# Patient Record
Sex: Male | Born: 1940 | Race: Black or African American | Hispanic: No | Marital: Married | Smoking: Former smoker
Health system: Southern US, Community
[De-identification: ages and names within clinical notes are randomized; demographics above are authoritative.]

## PROBLEM LIST (undated history)

## (undated) DIAGNOSIS — J45909 Unspecified asthma, uncomplicated: Secondary | ICD-10-CM

## (undated) DIAGNOSIS — R413 Other amnesia: Secondary | ICD-10-CM

## (undated) DIAGNOSIS — N400 Enlarged prostate without lower urinary tract symptoms: Secondary | ICD-10-CM

## (undated) DIAGNOSIS — M1990 Unspecified osteoarthritis, unspecified site: Secondary | ICD-10-CM

## (undated) HISTORY — DX: Unspecified osteoarthritis, unspecified site: M19.90

## (undated) HISTORY — PX: TONSILLECTOMY: SUR1361

## (undated) HISTORY — DX: Unspecified asthma, uncomplicated: J45.909

## (undated) HISTORY — DX: Other amnesia: R41.3

## (undated) HISTORY — DX: Benign prostatic hyperplasia without lower urinary tract symptoms: N40.0

---

## 2001-02-24 ENCOUNTER — Encounter: Payer: Self-pay | Admitting: Emergency Medicine

## 2001-02-24 ENCOUNTER — Emergency Department (HOSPITAL_COMMUNITY): Admission: EM | Admit: 2001-02-24 | Discharge: 2001-02-24 | Payer: Self-pay | Admitting: Emergency Medicine

## 2002-01-03 ENCOUNTER — Emergency Department (HOSPITAL_COMMUNITY): Admission: EM | Admit: 2002-01-03 | Discharge: 2002-01-03 | Payer: Self-pay | Admitting: Emergency Medicine

## 2002-05-27 ENCOUNTER — Encounter: Payer: Self-pay | Admitting: Orthopedic Surgery

## 2002-05-27 ENCOUNTER — Encounter: Admission: RE | Admit: 2002-05-27 | Discharge: 2002-05-27 | Payer: Self-pay | Admitting: Orthopedic Surgery

## 2002-06-10 ENCOUNTER — Encounter: Admission: RE | Admit: 2002-06-10 | Discharge: 2002-06-10 | Payer: Self-pay | Admitting: Orthopedic Surgery

## 2002-06-10 ENCOUNTER — Encounter: Payer: Self-pay | Admitting: Orthopedic Surgery

## 2002-06-24 ENCOUNTER — Encounter: Payer: Self-pay | Admitting: Orthopedic Surgery

## 2002-06-24 ENCOUNTER — Encounter: Admission: RE | Admit: 2002-06-24 | Discharge: 2002-06-24 | Payer: Self-pay | Admitting: Orthopedic Surgery

## 2002-11-09 ENCOUNTER — Emergency Department (HOSPITAL_COMMUNITY): Admission: EM | Admit: 2002-11-09 | Discharge: 2002-11-09 | Payer: Self-pay | Admitting: Emergency Medicine

## 2005-01-02 ENCOUNTER — Ambulatory Visit: Payer: Self-pay | Admitting: Gastroenterology

## 2005-01-08 ENCOUNTER — Ambulatory Visit (HOSPITAL_COMMUNITY): Admission: RE | Admit: 2005-01-08 | Discharge: 2005-01-08 | Payer: Self-pay | Admitting: Gastroenterology

## 2007-09-30 ENCOUNTER — Emergency Department (HOSPITAL_COMMUNITY): Admission: EM | Admit: 2007-09-30 | Discharge: 2007-09-30 | Payer: Self-pay | Admitting: Emergency Medicine

## 2008-11-10 ENCOUNTER — Encounter: Admission: RE | Admit: 2008-11-10 | Discharge: 2008-11-10 | Payer: Self-pay | Admitting: Internal Medicine

## 2008-11-17 ENCOUNTER — Encounter: Admission: RE | Admit: 2008-11-17 | Discharge: 2008-11-17 | Payer: Self-pay | Admitting: Internal Medicine

## 2010-12-16 ENCOUNTER — Inpatient Hospital Stay (INDEPENDENT_AMBULATORY_CARE_PROVIDER_SITE_OTHER)
Admission: RE | Admit: 2010-12-16 | Discharge: 2010-12-16 | Disposition: A | Discharge status: Home / Self Care | Payer: Medicare Other | Source: Ambulatory Visit | Attending: Family Medicine | Admitting: Family Medicine

## 2010-12-16 DIAGNOSIS — H6120 Impacted cerumen, unspecified ear: Secondary | ICD-10-CM

## 2010-12-16 DIAGNOSIS — R002 Palpitations: Secondary | ICD-10-CM

## 2010-12-16 DIAGNOSIS — K219 Gastro-esophageal reflux disease without esophagitis: Secondary | ICD-10-CM

## 2010-12-16 DIAGNOSIS — J309 Allergic rhinitis, unspecified: Secondary | ICD-10-CM

## 2011-11-09 DIAGNOSIS — N400 Enlarged prostate without lower urinary tract symptoms: Secondary | ICD-10-CM | POA: Diagnosis not present

## 2011-11-09 DIAGNOSIS — J45909 Unspecified asthma, uncomplicated: Secondary | ICD-10-CM | POA: Diagnosis not present

## 2011-11-09 DIAGNOSIS — R636 Underweight: Secondary | ICD-10-CM | POA: Diagnosis not present

## 2011-11-09 DIAGNOSIS — F411 Generalized anxiety disorder: Secondary | ICD-10-CM | POA: Diagnosis not present

## 2012-01-21 DIAGNOSIS — H2510 Age-related nuclear cataract, unspecified eye: Secondary | ICD-10-CM | POA: Diagnosis not present

## 2012-03-27 DIAGNOSIS — Z125 Encounter for screening for malignant neoplasm of prostate: Secondary | ICD-10-CM | POA: Diagnosis not present

## 2012-03-27 DIAGNOSIS — R350 Frequency of micturition: Secondary | ICD-10-CM | POA: Diagnosis not present

## 2012-03-27 DIAGNOSIS — R351 Nocturia: Secondary | ICD-10-CM | POA: Diagnosis not present

## 2012-04-10 DIAGNOSIS — F329 Major depressive disorder, single episode, unspecified: Secondary | ICD-10-CM | POA: Diagnosis not present

## 2012-04-10 DIAGNOSIS — N400 Enlarged prostate without lower urinary tract symptoms: Secondary | ICD-10-CM | POA: Diagnosis not present

## 2012-04-10 DIAGNOSIS — R413 Other amnesia: Secondary | ICD-10-CM | POA: Diagnosis not present

## 2012-07-14 DIAGNOSIS — H6120 Impacted cerumen, unspecified ear: Secondary | ICD-10-CM | POA: Diagnosis not present

## 2012-07-14 DIAGNOSIS — J45909 Unspecified asthma, uncomplicated: Secondary | ICD-10-CM | POA: Diagnosis not present

## 2012-07-14 DIAGNOSIS — N400 Enlarged prostate without lower urinary tract symptoms: Secondary | ICD-10-CM | POA: Diagnosis not present

## 2012-08-19 DIAGNOSIS — Z23 Encounter for immunization: Secondary | ICD-10-CM | POA: Diagnosis not present

## 2012-09-22 DIAGNOSIS — Z125 Encounter for screening for malignant neoplasm of prostate: Secondary | ICD-10-CM | POA: Diagnosis not present

## 2012-09-22 DIAGNOSIS — R351 Nocturia: Secondary | ICD-10-CM | POA: Diagnosis not present

## 2012-09-22 DIAGNOSIS — N400 Enlarged prostate without lower urinary tract symptoms: Secondary | ICD-10-CM | POA: Diagnosis not present

## 2012-09-29 DIAGNOSIS — N400 Enlarged prostate without lower urinary tract symptoms: Secondary | ICD-10-CM | POA: Diagnosis not present

## 2012-10-16 DIAGNOSIS — K219 Gastro-esophageal reflux disease without esophagitis: Secondary | ICD-10-CM | POA: Diagnosis not present

## 2012-10-16 DIAGNOSIS — K5289 Other specified noninfective gastroenteritis and colitis: Secondary | ICD-10-CM | POA: Diagnosis not present

## 2013-01-21 DIAGNOSIS — J45909 Unspecified asthma, uncomplicated: Secondary | ICD-10-CM | POA: Diagnosis not present

## 2013-01-21 DIAGNOSIS — F329 Major depressive disorder, single episode, unspecified: Secondary | ICD-10-CM | POA: Diagnosis not present

## 2013-01-21 DIAGNOSIS — R413 Other amnesia: Secondary | ICD-10-CM | POA: Diagnosis not present

## 2013-01-21 DIAGNOSIS — E781 Pure hyperglyceridemia: Secondary | ICD-10-CM | POA: Diagnosis not present

## 2013-01-30 ENCOUNTER — Emergency Department (INDEPENDENT_AMBULATORY_CARE_PROVIDER_SITE_OTHER)
Admission: EM | Admit: 2013-01-30 | Discharge: 2013-01-30 | Disposition: A | Discharge status: Home / Self Care | Payer: Medicare Other | Source: Home / Self Care | Attending: Emergency Medicine | Admitting: Emergency Medicine

## 2013-01-30 ENCOUNTER — Emergency Department (INDEPENDENT_AMBULATORY_CARE_PROVIDER_SITE_OTHER): Payer: Medicare Other

## 2013-01-30 ENCOUNTER — Encounter (HOSPITAL_COMMUNITY): Payer: Self-pay | Admitting: Emergency Medicine

## 2013-01-30 DIAGNOSIS — F0390 Unspecified dementia without behavioral disturbance: Secondary | ICD-10-CM | POA: Diagnosis not present

## 2013-01-30 DIAGNOSIS — R05 Cough: Secondary | ICD-10-CM | POA: Diagnosis not present

## 2013-01-30 DIAGNOSIS — J438 Other emphysema: Secondary | ICD-10-CM

## 2013-01-30 DIAGNOSIS — J019 Acute sinusitis, unspecified: Secondary | ICD-10-CM

## 2013-01-30 DIAGNOSIS — K219 Gastro-esophageal reflux disease without esophagitis: Secondary | ICD-10-CM

## 2013-01-30 DIAGNOSIS — J439 Emphysema, unspecified: Secondary | ICD-10-CM

## 2013-01-30 LAB — POCT I-STAT, CHEM 8
BUN: 12 mg/dL (ref 6–23)
Calcium, Ion: 1.12 mmol/L — ABNORMAL LOW (ref 1.13–1.30)
Creatinine, Ser: 0.9 mg/dL (ref 0.50–1.35)
Glucose, Bld: 100 mg/dL — ABNORMAL HIGH (ref 70–99)
Hemoglobin: 14.6 g/dL (ref 13.0–17.0)
TCO2: 28 mmol/L (ref 0–100)

## 2013-01-30 MED ORDER — DONEPEZIL HCL 10 MG PO TABS: 5.0000 mg | ORAL_TABLET | Freq: Every evening | ORAL | Status: DC | PRN

## 2013-01-30 MED ORDER — FLUTICASONE PROPIONATE 50 MCG/ACT NA SUSP: 2.0000 | Freq: Every day | NASAL | Status: AC

## 2013-01-30 MED ORDER — ESOMEPRAZOLE MAGNESIUM 40 MG PO CPDR: 40.0000 mg | DELAYED_RELEASE_CAPSULE | Freq: Every day | ORAL | Status: DC

## 2013-01-30 MED ORDER — CEPHALEXIN 500 MG PO CAPS: 500.0000 mg | ORAL_CAPSULE | Freq: Three times a day (TID) | ORAL | Status: DC

## 2013-01-30 NOTE — ED Notes (Signed)
Pt c/o acid indigestion and emesis for x 1 week. Pt admits that he did have [employer] french fries and a yogurt parfait at the onset of symptoms. Also admits to midnight snacking. Feels nauseous and indigestion keeps him up at night. Has taken Omeprazole up until Thursday with no relief. Last reported emesis this morning. Patient is alert and oriented.

## 2013-01-30 NOTE — ED Provider Notes (Signed)
Chief Complaint:   Chief Complaint  Patient presents with  . Emesis  . Gastrophageal Reflux    History of Present Illness:   Andre Ball is a 72 year old male with Alzheimer's disease who presents with nasal congestion and reflux symptoms. He is very forgetful and a poor historian. Fortunately his wife accompanied him and was able to fill in most of the history.  1. Nasal congestion: He has had a two-week history of nasal congestion with green drainage, slight cough, sore throat, postnasal drip, and headache that comes and goes. He denies any fever, difficulty breathing, or wheezing.  2. Indigestion and heartburn: He has a long-standing history of indigestion heartburn. Recently his Aricept was increased to 10 mg from 5 mg. This seemed to make his indigestion worse. He notes waterbrash, chest pain, nausea, vomiting, epigastric discomfort, and not sleeping well at night. He denies any dysphagia, evidence of GI bleeding, diarrhea, or difficulty breathing.  Review of Systems:  Other than noted above, the patient denies any of the following symptoms. Systemic:  No fever, chills, sweats, fatigue, myalgias, headache, or anorexia. Eye:  No redness, pain or drainage. ENT:  No earache, nasal congestion, rhinorrhea, sinus pressure, or sore throat. Lungs:  No cough, sputum production, wheezing, shortness of breath.  Cardiovascular:  No chest pain, palpitations, or syncope. GI:  No nausea, vomiting, abdominal pain or diarrhea. GU:  No dysuria, frequency, or hematuria. Skin:  No rash or pruritis.  PMFSH:  Past medical history, family history, social history, meds, and allergies were reviewed.  He has no medication allergies. He takes Aricept, Singulair, Zoloft, Flomax, and Flonase. He has BPH, and dementia. He lives with his wife.  Physical Exam:   Vital signs:  There were no vitals taken for this visit. General:  Alert, in no distress. Eye:  PERRL, full EOMs.  Lids and conjunctivas were  normal. ENT:  TMs and canals were normal, without erythema or inflammation.  Nasal mucosa was clear and uncongested, without drainage.  Mucous membranes were moist.  Pharynx was clear, without exudate or drainage.  There were no oral ulcerations or lesions. Neck:  Supple, no adenopathy, tenderness or mass. Thyroid was normal. Lungs:  No respiratory distress.  Lungs were clear to auscultation, without wheezes, rales or rhonchi.  Breath sounds were clear and equal bilaterally. Heart:  Regular rhythm, without gallops, murmers or rubs. Abdomen:  Soft, flat, and non-tender to palpation.  No hepatosplenomagaly or mass. Skin:  Clear, warm, and dry, without rash or lesions.  Labs:   Results for orders placed during the hospital encounter of 01/30/13  POCT I-STAT, CHEM 8      Result Value Range   Sodium 141  135 - 145 mEq/L   Potassium 4.3  3.5 - 5.1 mEq/L   Chloride 106  96 - 112 mEq/L   BUN 12  6 - 23 mg/dL   Creatinine, Ser 2.13  0.50 - 1.35 mg/dL   Glucose, Bld 086 (*) 70 - 99 mg/dL   Calcium, Ion 5.78 (*) 1.13 - 1.30 mmol/L   TCO2 28  0 - 100 mmol/L   Hemoglobin 14.6  13.0 - 17.0 g/dL   HCT 46.9  62.9 - 52.8 %     Radiology:  Dg Chest 2 View  01/30/2013  *RADIOLOGY REPORT*  Clinical Data: Cough, abdominal pain  CHEST - 2 VIEW  Comparison: 11/10/2008  Findings: Hyperinflation noted compatible with COPD/emphysema. Normal heart size and vascularity.  No focal pneumonia, collapse, consolidation, effusion or pneumothorax.  Trachea midline.  No significant interval change.  IMPRESSION: Hyperinflation.  No acute process.   Original Report Authenticated By: Judie Petit. Miles Costain, M.D.     Date: 01/30/2013  Rate: 53  Rhythm: sinus bradycardia with premature atrial complexes.  QRS Axis: normal  Intervals: normal  ST/T Wave abnormalities: normal  Conduction Disutrbances:none  Narrative Interpretation: Sinus bradycardia with premature atrial complexes, incomplete right bundle branch block, otherwise normal.   Old EKG Reviewed: none available  Assessment:  The primary encounter diagnosis was Acute sinusitis. Diagnoses of GERD (gastroesophageal reflux disease), Dementia, and Emphysema were also pertinent to this visit.  He appears to have acute sinusitis, possibly on the basis of allergic rhinitis. It also appears he has a secondary problem with reflux esophagitis and other GI symptoms, possibly due to increase in his Aricept recently. He was told to decrease his Aricept back down to 5 mg a day, he was given Nexium for his reflux and told to followup in a week with his primary care physician.  Plan:   1.  The following meds were prescribed:   Discharge Medication List as of 01/30/2013  5:17 PM    START taking these medications   Details  cephALEXin (KEFLEX) 500 MG capsule Take 1 capsule (500 mg total) by mouth 3 (three) times daily., Starting 01/30/2013, Until Discontinued, Normal    esomeprazole (NEXIUM) 40 MG capsule Take 1 capsule (40 mg total) by mouth daily., Starting 01/30/2013, Until Discontinued, Normal    fluticasone (FLONASE) 50 MCG/ACT nasal spray Place 2 sprays into the nose daily., Starting 01/30/2013, Until Discontinued, Normal       2.  The patient was instructed in symptomatic care and handouts were given. 3.  The patient was told to return if becoming worse in any way, if no better in 3 or 4 days, and given some red flag symptoms such as fever, difficulty breathing, or worsening pain that would indicate earlier return.    Reuben Likes, MD 01/30/13 2014

## 2013-02-25 DIAGNOSIS — F980 Enuresis not due to a substance or known physiological condition: Secondary | ICD-10-CM | POA: Diagnosis not present

## 2013-03-02 DIAGNOSIS — H04129 Dry eye syndrome of unspecified lacrimal gland: Secondary | ICD-10-CM | POA: Diagnosis not present

## 2013-03-17 DIAGNOSIS — R350 Frequency of micturition: Secondary | ICD-10-CM | POA: Diagnosis not present

## 2013-03-17 DIAGNOSIS — R351 Nocturia: Secondary | ICD-10-CM | POA: Diagnosis not present

## 2013-03-18 DIAGNOSIS — H43819 Vitreous degeneration, unspecified eye: Secondary | ICD-10-CM | POA: Diagnosis not present

## 2013-04-07 DIAGNOSIS — M25569 Pain in unspecified knee: Secondary | ICD-10-CM | POA: Diagnosis not present

## 2013-04-14 DIAGNOSIS — M25569 Pain in unspecified knee: Secondary | ICD-10-CM | POA: Diagnosis not present

## 2013-04-15 ENCOUNTER — Other Ambulatory Visit: Payer: Self-pay | Admitting: Family Medicine

## 2013-04-15 ENCOUNTER — Ambulatory Visit
Admission: RE | Admit: 2013-04-15 | Discharge: 2013-04-15 | Disposition: A | Discharge status: Home / Self Care | Payer: Medicare Other | Source: Ambulatory Visit | Attending: Family Medicine | Admitting: Family Medicine

## 2013-04-15 DIAGNOSIS — M25561 Pain in right knee: Secondary | ICD-10-CM

## 2013-04-15 DIAGNOSIS — M25569 Pain in unspecified knee: Secondary | ICD-10-CM | POA: Diagnosis not present

## 2013-04-15 DIAGNOSIS — S99929A Unspecified injury of unspecified foot, initial encounter: Secondary | ICD-10-CM | POA: Diagnosis not present

## 2013-04-15 DIAGNOSIS — S8990XA Unspecified injury of unspecified lower leg, initial encounter: Secondary | ICD-10-CM | POA: Diagnosis not present

## 2013-04-30 DIAGNOSIS — M25569 Pain in unspecified knee: Secondary | ICD-10-CM | POA: Diagnosis not present

## 2013-04-30 DIAGNOSIS — R002 Palpitations: Secondary | ICD-10-CM | POA: Diagnosis not present

## 2013-05-06 DIAGNOSIS — R002 Palpitations: Secondary | ICD-10-CM | POA: Diagnosis not present

## 2013-05-27 DIAGNOSIS — G309 Alzheimer's disease, unspecified: Secondary | ICD-10-CM | POA: Diagnosis not present

## 2013-05-27 DIAGNOSIS — F0280 Dementia in other diseases classified elsewhere without behavioral disturbance: Secondary | ICD-10-CM | POA: Diagnosis not present

## 2013-05-27 DIAGNOSIS — I499 Cardiac arrhythmia, unspecified: Secondary | ICD-10-CM | POA: Diagnosis not present

## 2013-06-18 DIAGNOSIS — I059 Rheumatic mitral valve disease, unspecified: Secondary | ICD-10-CM | POA: Diagnosis not present

## 2013-06-18 DIAGNOSIS — J45909 Unspecified asthma, uncomplicated: Secondary | ICD-10-CM | POA: Diagnosis not present

## 2013-06-18 DIAGNOSIS — I499 Cardiac arrhythmia, unspecified: Secondary | ICD-10-CM | POA: Diagnosis not present

## 2013-06-18 DIAGNOSIS — R413 Other amnesia: Secondary | ICD-10-CM | POA: Diagnosis not present

## 2013-07-10 DIAGNOSIS — L819 Disorder of pigmentation, unspecified: Secondary | ICD-10-CM | POA: Diagnosis not present

## 2013-07-21 DIAGNOSIS — R351 Nocturia: Secondary | ICD-10-CM | POA: Diagnosis not present

## 2013-07-21 DIAGNOSIS — N400 Enlarged prostate without lower urinary tract symptoms: Secondary | ICD-10-CM | POA: Diagnosis not present

## 2013-07-21 DIAGNOSIS — R350 Frequency of micturition: Secondary | ICD-10-CM | POA: Diagnosis not present

## 2013-07-31 DIAGNOSIS — R413 Other amnesia: Secondary | ICD-10-CM | POA: Diagnosis not present

## 2013-07-31 DIAGNOSIS — N400 Enlarged prostate without lower urinary tract symptoms: Secondary | ICD-10-CM | POA: Diagnosis not present

## 2013-08-27 DIAGNOSIS — Z23 Encounter for immunization: Secondary | ICD-10-CM | POA: Diagnosis not present

## 2013-09-16 DIAGNOSIS — N529 Male erectile dysfunction, unspecified: Secondary | ICD-10-CM | POA: Diagnosis not present

## 2013-09-16 DIAGNOSIS — R413 Other amnesia: Secondary | ICD-10-CM | POA: Diagnosis not present

## 2013-09-16 DIAGNOSIS — N400 Enlarged prostate without lower urinary tract symptoms: Secondary | ICD-10-CM | POA: Diagnosis not present

## 2013-09-22 DIAGNOSIS — R351 Nocturia: Secondary | ICD-10-CM | POA: Diagnosis not present

## 2013-09-22 DIAGNOSIS — Z125 Encounter for screening for malignant neoplasm of prostate: Secondary | ICD-10-CM | POA: Diagnosis not present

## 2013-09-22 DIAGNOSIS — R350 Frequency of micturition: Secondary | ICD-10-CM | POA: Diagnosis not present

## 2013-09-29 DIAGNOSIS — Z125 Encounter for screening for malignant neoplasm of prostate: Secondary | ICD-10-CM | POA: Diagnosis not present

## 2013-09-29 DIAGNOSIS — R350 Frequency of micturition: Secondary | ICD-10-CM | POA: Diagnosis not present

## 2013-09-29 DIAGNOSIS — N400 Enlarged prostate without lower urinary tract symptoms: Secondary | ICD-10-CM | POA: Diagnosis not present

## 2013-09-29 DIAGNOSIS — R351 Nocturia: Secondary | ICD-10-CM | POA: Diagnosis not present

## 2013-11-30 DIAGNOSIS — F0280 Dementia in other diseases classified elsewhere without behavioral disturbance: Secondary | ICD-10-CM | POA: Diagnosis not present

## 2013-11-30 DIAGNOSIS — I499 Cardiac arrhythmia, unspecified: Secondary | ICD-10-CM | POA: Diagnosis not present

## 2013-12-11 DIAGNOSIS — H2510 Age-related nuclear cataract, unspecified eye: Secondary | ICD-10-CM | POA: Diagnosis not present

## 2013-12-11 DIAGNOSIS — H43819 Vitreous degeneration, unspecified eye: Secondary | ICD-10-CM | POA: Diagnosis not present

## 2013-12-17 DIAGNOSIS — J45909 Unspecified asthma, uncomplicated: Secondary | ICD-10-CM | POA: Diagnosis not present

## 2013-12-17 DIAGNOSIS — E781 Pure hyperglyceridemia: Secondary | ICD-10-CM | POA: Diagnosis not present

## 2013-12-17 DIAGNOSIS — R413 Other amnesia: Secondary | ICD-10-CM | POA: Diagnosis not present

## 2013-12-17 DIAGNOSIS — N400 Enlarged prostate without lower urinary tract symptoms: Secondary | ICD-10-CM | POA: Diagnosis not present

## 2014-01-04 DIAGNOSIS — L819 Disorder of pigmentation, unspecified: Secondary | ICD-10-CM | POA: Diagnosis not present

## 2014-03-24 DIAGNOSIS — F3289 Other specified depressive episodes: Secondary | ICD-10-CM | POA: Diagnosis not present

## 2014-03-24 DIAGNOSIS — E781 Pure hyperglyceridemia: Secondary | ICD-10-CM | POA: Diagnosis not present

## 2014-03-24 DIAGNOSIS — R413 Other amnesia: Secondary | ICD-10-CM | POA: Diagnosis not present

## 2014-03-24 DIAGNOSIS — F329 Major depressive disorder, single episode, unspecified: Secondary | ICD-10-CM | POA: Diagnosis not present

## 2014-06-05 ENCOUNTER — Encounter: Payer: Self-pay | Admitting: *Deleted

## 2014-06-23 DIAGNOSIS — N400 Enlarged prostate without lower urinary tract symptoms: Secondary | ICD-10-CM | POA: Diagnosis not present

## 2014-06-23 DIAGNOSIS — E781 Pure hyperglyceridemia: Secondary | ICD-10-CM | POA: Diagnosis not present

## 2014-06-23 DIAGNOSIS — R413 Other amnesia: Secondary | ICD-10-CM | POA: Diagnosis not present

## 2014-07-13 DIAGNOSIS — Z23 Encounter for immunization: Secondary | ICD-10-CM | POA: Diagnosis not present

## 2014-07-21 DIAGNOSIS — I499 Cardiac arrhythmia, unspecified: Secondary | ICD-10-CM | POA: Diagnosis not present

## 2014-09-01 DIAGNOSIS — R413 Other amnesia: Secondary | ICD-10-CM | POA: Diagnosis not present

## 2014-09-01 DIAGNOSIS — N400 Enlarged prostate without lower urinary tract symptoms: Secondary | ICD-10-CM | POA: Diagnosis not present

## 2014-09-29 DIAGNOSIS — Z0000 Encounter for general adult medical examination without abnormal findings: Secondary | ICD-10-CM | POA: Diagnosis not present

## 2014-09-30 DIAGNOSIS — F0390 Unspecified dementia without behavioral disturbance: Secondary | ICD-10-CM | POA: Diagnosis not present

## 2014-09-30 DIAGNOSIS — R350 Frequency of micturition: Secondary | ICD-10-CM | POA: Diagnosis not present

## 2014-09-30 DIAGNOSIS — N401 Enlarged prostate with lower urinary tract symptoms: Secondary | ICD-10-CM | POA: Diagnosis not present

## 2014-11-01 ENCOUNTER — Ambulatory Visit (INDEPENDENT_AMBULATORY_CARE_PROVIDER_SITE_OTHER): Payer: Medicare Other | Admitting: Neurology

## 2014-11-01 ENCOUNTER — Encounter: Payer: Self-pay | Admitting: Neurology

## 2014-11-01 VITALS — BP 137/71 | HR 60 | Ht 71.0 in | Wt 176.2 lb

## 2014-11-01 DIAGNOSIS — R413 Other amnesia: Secondary | ICD-10-CM | POA: Insufficient documentation

## 2014-11-01 DIAGNOSIS — R5381 Other malaise: Secondary | ICD-10-CM

## 2014-11-01 DIAGNOSIS — E538 Deficiency of other specified B group vitamins: Secondary | ICD-10-CM

## 2014-11-01 DIAGNOSIS — R5383 Other fatigue: Secondary | ICD-10-CM | POA: Diagnosis not present

## 2014-11-01 HISTORY — DX: Other amnesia: R41.3

## 2014-11-01 MED ORDER — DONEPEZIL HCL 10 MG PO TABS: 10.0000 mg | ORAL_TABLET | Freq: Every day | ORAL | Status: DC

## 2014-11-01 NOTE — Progress Notes (Signed)
Reason for visit: Memory disturbance  Andre Ball is a 74 y.o. male  History of present illness:  Andre Ball is a 74 year old right-handed black male with a history of a progressive memory disturbance that has been present for least 2 years. The patient has been placed on Aricept, currently on 5 mg at night. The patient reports that he does have some problems with short-term memory, difficulty remembering names for people and places. He has some difficulty misplacing things about the house, and he has some mild problems with directions while driving. He does do the finances, and he may have some occasional problems getting the bills paid on time. He denies any significant issues keeping up with medications or appointments. The patient has some slight problems with fatigue, but his main issue with sleeping at night is his urinary frequency. He denies any focal numbness or weakness on the face, arms, or legs. He denies any balance issues. He has a very strong family history of memory problems in both parents, and in at least 3 or 4 other siblings. He comes this office for an evaluation    Past Medical History  Diagnosis Date  . Asthma   . BPH (benign prostatic hyperplasia)   . Memory deficits 11/01/2014    Past Surgical History  Procedure Laterality Date  . Tonsillectomy      Family History  Problem Relation Age of Onset  . Alzheimer's disease Mother   . Alzheimer's disease Father   . Diabetes Brother   . Alzheimer's disease Sister   . Heart disease Brother   . Diabetes Brother   . Alzheimer's disease Sister   . Alzheimer's disease Brother   . Alzheimer's disease Brother     Social history:  reports that he has quit smoking. He has never used smokeless tobacco. He reports that he drinks about 0.6 oz of alcohol per week. He reports that he does not use illicit drugs.  Medications:  Current Outpatient Prescriptions on File Prior to Visit  Medication Sig Dispense Refill  .  fluticasone (FLONASE) 50 MCG/ACT nasal spray Place 2 sprays into the nose daily. 16 g 0  . montelukast (SINGULAIR) 10 MG tablet Take 10 mg by mouth at bedtime.    . sertraline (ZOLOFT) 25 MG tablet Take 25 mg by mouth daily.    . tamsulosin (FLOMAX) 0.4 MG CAPS Take 0.4 mg by mouth daily.    . [DISCONTINUED] omeprazole (PRILOSEC) 20 MG capsule Take 20 mg by mouth daily.     No current facility-administered medications on file prior to visit.     No Known Allergies  ROS:  Out of a complete 14 system review of symptoms, the patient complains only of the following symptoms, and all other reviewed systems are negative.  Weight gain, palpitations of the heart Skin rash, itching, moles Shortness of breath, incontinence Urination problems, impotence Joint pain Allergies, runny nose, skin sensitivity Memory loss, confusion, tremor Anxiety, not enough sleep, change in appetite Sleepiness, snoring  Blood pressure 137/71, pulse 60, height  (1.803 m), weight 176 lb 3.2 oz (79.924 kg).  Physical Exam  General: The patient is alert and cooperative at the time of the examination.  Eyes: Pupils are equal, round, and reactive to light. Discs are flat bilaterally.  Neck: The neck is supple, no carotid bruits are noted.  Respiratory: The respiratory examination is clear.  Cardiovascular: The cardiovascular examination reveals a regular rate and rhythm, no obvious murmurs or rubs are  noted.  Skin: Extremities are without significant edema.  Neurologic Exam  Mental status: The Mini-Mental Status Examination done today shows a total score 25/30.  Cranial nerves: Facial symmetry is present. There is good sensation of the face to pinprick and soft touch bilaterally. The strength of the facial muscles and the muscles to head turning and shoulder shrug are normal bilaterally. Speech is well enunciated, no aphasia or dysarthria is noted. Extraocular movements are full. Visual fields are full.  The tongue is midline, and the patient has symmetric elevation of the soft palate. No obvious hearing deficits are noted.  Motor: The motor testing reveals 5 over 5 strength of all 4 extremities. Good symmetric motor tone is noted throughout.  Sensory: Sensory testing is intact to pinprick, soft touch, vibration sensation, and position sense on all 4 extremities. No evidence of extinction is noted.  Coordination: Cerebellar testing reveals good finger-nose-finger and heel-to-shin bilaterally.  Gait and station: Gait is normal. Tandem gait is normal. Romberg is negative. No drift is seen.  Reflexes: Deep tendon reflexes are symmetric and normal bilaterally. Toes are downgoing bilaterally.   Assessment/Plan:  1. Progressive memory disturbance  The patient has a very strong family history of Alzheimer's disease. He is on low-dose Aricept, the dose will be increased. In the future, we may add Namenda to his dosing. He is interested in learning more about Alzheimer's research. I will follow-up with him in about 6 months. Blood work will be done today, he will be set up for MRI of the brain.  Marlan Palau MD 11/01/2014 8:02 PM  [facility] [address] [place], [place] [address]  Phone [phone] Fax [phone]

## 2014-11-01 NOTE — Patient Instructions (Signed)
Begin Aricept (donepezil) at 10 mg at night. Look out for side effects that may include nausea, diarrhea, weight loss, or stomach cramps. This medication will also cause a runny nose, therefore there is no need for allergy medications for this purpose.

## 2014-11-02 LAB — SPECIMEN STATUS REPORT

## 2014-11-04 LAB — VITAMIN B12: VITAMIN B 12: 700 pg/mL (ref 211–946)

## 2014-11-04 LAB — RPR: SYPHILIS RPR SCR: NONREACTIVE

## 2014-11-04 LAB — COPPER, SERUM: Copper: 133 ug/dL (ref 72–166)

## 2014-11-04 LAB — TSH: TSH: 2.04 u[IU]/mL (ref 0.450–4.500)

## 2014-12-01 DIAGNOSIS — R413 Other amnesia: Secondary | ICD-10-CM | POA: Diagnosis not present

## 2014-12-01 DIAGNOSIS — N529 Male erectile dysfunction, unspecified: Secondary | ICD-10-CM | POA: Diagnosis not present

## 2014-12-01 DIAGNOSIS — J45909 Unspecified asthma, uncomplicated: Secondary | ICD-10-CM | POA: Diagnosis not present

## 2014-12-02 ENCOUNTER — Ambulatory Visit
Admission: RE | Admit: 2014-12-02 | Discharge: 2014-12-02 | Disposition: A | Discharge status: Home / Self Care | Payer: Medicare Other | Source: Ambulatory Visit | Attending: Neurology | Admitting: Neurology

## 2014-12-02 DIAGNOSIS — E785 Hyperlipidemia, unspecified: Secondary | ICD-10-CM | POA: Diagnosis not present

## 2014-12-02 DIAGNOSIS — R413 Other amnesia: Secondary | ICD-10-CM

## 2014-12-02 DIAGNOSIS — J45998 Other asthma: Secondary | ICD-10-CM | POA: Diagnosis not present

## 2014-12-03 ENCOUNTER — Telehealth: Payer: Self-pay | Admitting: Neurology

## 2014-12-03 NOTE — Telephone Encounter (Signed)
   I called the patient. I discussed the results of the MRI with him and his wife. They are interested in Alzheimer's research, I will forward a note to our research coordinator.   MRI brain 12/03/2014:  IMPRESSION:  Abnormal MRI brain (without) demonstrating: 1. Severe right and moderate-severe left mesial temporal atrophy.  2. 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 Autoimmune, inflammatory or post-infectious etiologies less likely.  3. 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 4. No acute findings.

## 2014-12-08 DIAGNOSIS — T7840XS Allergy, unspecified, sequela: Secondary | ICD-10-CM | POA: Diagnosis not present

## 2014-12-17 DIAGNOSIS — H43813 Vitreous degeneration, bilateral: Secondary | ICD-10-CM | POA: Diagnosis not present

## 2014-12-17 DIAGNOSIS — H2513 Age-related nuclear cataract, bilateral: Secondary | ICD-10-CM | POA: Diagnosis not present

## 2014-12-18 ENCOUNTER — Emergency Department (INDEPENDENT_AMBULATORY_CARE_PROVIDER_SITE_OTHER)
Admission: EM | Admit: 2014-12-18 | Discharge: 2014-12-18 | Disposition: A | Discharge status: Home / Self Care | Payer: Medicare Other | Source: Home / Self Care | Attending: Emergency Medicine | Admitting: Emergency Medicine

## 2014-12-18 ENCOUNTER — Encounter (HOSPITAL_COMMUNITY): Payer: Self-pay | Admitting: Emergency Medicine

## 2014-12-18 DIAGNOSIS — G479 Sleep disorder, unspecified: Secondary | ICD-10-CM | POA: Diagnosis not present

## 2014-12-18 NOTE — ED Provider Notes (Signed)
CSN: [id number]     Arrival date & time 12/18/14  1341 History   First MD Initiated Contact with Patient 12/18/14 1435     Chief Complaint  Patient presents with  . Nasal Congestion  . Pleurisy   (Consider location/radiation/quality/duration/timing/severity/associated sxs/prior Treatment) HPI  Pt is an extremely poor historian.  Pt presents for eval of congestion, neck muscular tightness, strange feeling in feet, rhinorrhea, allergies, chest congestion, not sleeping well, waking up at night to urinate. All of these problems are being treated by his primary care doctor, he is here today because she does not his doctor is doing a good job and he wants to see a specialist. He is unable to say exactly what sort of specialist he might want to see and what problem he needs to see a specialist for. All of these issues have been going on for many months. He is currently asymptomatic. He has no recent change in any of his symptoms. No chest pain, shortness of breath, NVD, abdominal pain. No recent travel or sick contacts. Of note, he has dementia although he is very high functioning  Past Medical History  Diagnosis Date  . Asthma   . BPH (benign prostatic hyperplasia)   . Memory deficits 11/01/2014   Past Surgical History  Procedure Laterality Date  . Tonsillectomy     Family History  Problem Relation Age of Onset  . Alzheimer's disease Mother   . Alzheimer's disease Father   . Diabetes Brother   . Alzheimer's disease Sister   . Heart disease Brother   . Diabetes Brother   . Alzheimer's disease Sister   . Alzheimer's disease Brother   . Alzheimer's disease Brother    History  Substance Use Topics  . Smoking status: Former Games developermoker  . Smokeless tobacco: Never Used  . Alcohol Use: 0.6 oz/week    1 Glasses of wine per week     Comment: occasionally    Review of Systems  Constitutional: Positive for fatigue.  HENT: Positive for congestion.   Respiratory: Positive for chest tightness.    Cardiovascular: Negative for chest pain.  Gastrointestinal: Negative for nausea, vomiting and diarrhea.  Endocrine: Positive for polyuria.  Genitourinary: Positive for urgency.  Musculoskeletal: Positive for neck pain.  Skin: Negative for rash.  Neurological: Positive for tremors.  Psychiatric/Behavioral: Positive for confusion and sleep disturbance.  All other systems reviewed and are negative.   Allergies  Review of patient's allergies indicates no known allergies.  Home Medications   Prior to Admission medications   Medication Sig Start Date End Date Taking? Authorizing Provider  aspirin 81 MG tablet Take 81 mg by mouth daily.   Yes Historical Provider, MD  donepezil (ARICEPT) 10 MG tablet Take 1 tablet (10 mg total) by mouth at bedtime. 11/01/14  Yes York Spanielharles K Willis, MD  finasteride (PROSCAR) 5 MG tablet Take 5 mg by mouth daily. 09/30/14  Yes Historical Provider, MD  montelukast (SINGULAIR) 10 MG tablet Take 10 mg by mouth at bedtime.   Yes Historical Provider, MD  Multiple Vitamin (MULTIVITAMIN) tablet Take 1 tablet by mouth daily.   Yes Historical Provider, MD  sertraline (ZOLOFT) 25 MG tablet Take 25 mg by mouth daily.   Yes Historical Provider, MD  tamsulosin (FLOMAX) 0.4 MG CAPS Take 0.4 mg by mouth daily.   Yes Historical Provider, MD  fluticasone (FLONASE) 50 MCG/ACT nasal spray Place 2 sprays into the nose daily. 01/30/13   Reuben Likesavid C Keller, MD   BP 171/75 mmHg  Pulse 61  Temp(Src) 98.2 F (36.8 C) (Oral)  Resp 16  SpO2 100% Physical Exam  Constitutional: He is oriented to person, place, and time. He appears well-developed and well-nourished. No distress.  HENT:  Head: Normocephalic.  Mouth/Throat: Oropharynx is clear and moist.  Neck: Normal range of motion. Neck supple.  Cardiovascular: Normal rate, regular rhythm and normal heart sounds.   Pulmonary/Chest: Effort normal and breath sounds normal. No respiratory distress.  Abdominal: Soft. He exhibits no mass. There  is no tenderness. There is no rebound and no guarding.  Neurological: He is alert and oriented to person, place, and time. Coordination normal.  Skin: Skin is warm and dry. No rash noted. He is not diaphoretic.  Psychiatric: He has a normal mood and affect. Judgment normal.  Nursing note and vitals reviewed.   ED Course  Procedures (including critical care time) Labs Review Labs Reviewed - No data to display  Imaging Review No results found.   MDM   1. Sleep disturbance    Patient with wide variety of complaints. I discussed all of these with the patient in great length. He is unable to narrow down what he would like to focus on today. He will follow-up with his primary care doctor for treatment of all of these problems.     Graylon Good, PA-C 12/18/14 [address] PA-C 12/18/14 1524

## 2014-12-18 NOTE — ED Notes (Signed)
Pt states that he has had chest congestion with cough and chest tightness for 2 weeks. Pt is in no acute distress at this time.

## 2014-12-18 NOTE — Discharge Instructions (Signed)
You need to follow up with your primary care physician for all of these issues.  If you are unsatisfied with your primary care physician, you are always free to seek a new one.  Dr. Duanne Guessewey is an excellent family physician, you may call her office to try to make a follow up appointment.

## 2014-12-20 DIAGNOSIS — R0789 Other chest pain: Secondary | ICD-10-CM | POA: Diagnosis not present

## 2014-12-20 DIAGNOSIS — I499 Cardiac arrhythmia, unspecified: Secondary | ICD-10-CM | POA: Diagnosis not present

## 2014-12-21 DIAGNOSIS — R413 Other amnesia: Secondary | ICD-10-CM | POA: Diagnosis not present

## 2014-12-21 DIAGNOSIS — R0981 Nasal congestion: Secondary | ICD-10-CM | POA: Diagnosis not present

## 2014-12-23 DIAGNOSIS — I495 Sick sinus syndrome: Secondary | ICD-10-CM | POA: Diagnosis not present

## 2014-12-23 DIAGNOSIS — R002 Palpitations: Secondary | ICD-10-CM | POA: Diagnosis not present

## 2014-12-27 ENCOUNTER — Telehealth: Payer: Self-pay | Admitting: Neurology

## 2014-12-27 DIAGNOSIS — R0789 Other chest pain: Secondary | ICD-10-CM | POA: Diagnosis not present

## 2014-12-27 NOTE — Telephone Encounter (Signed)
Patient called and has questions concerning his prescriptions sent in to the pharmacy he can be reached at [phone].

## 2014-12-27 NOTE — Telephone Encounter (Signed)
Spoke to patient and relayed that the prescription was for Donepezil 10mg  1 daily.  Patient verbalized understanding.

## 2015-01-04 DIAGNOSIS — R0789 Other chest pain: Secondary | ICD-10-CM | POA: Diagnosis not present

## 2015-01-04 DIAGNOSIS — G309 Alzheimer's disease, unspecified: Secondary | ICD-10-CM | POA: Diagnosis not present

## 2015-01-04 DIAGNOSIS — I499 Cardiac arrhythmia, unspecified: Secondary | ICD-10-CM | POA: Diagnosis not present

## 2015-01-24 DIAGNOSIS — J45909 Unspecified asthma, uncomplicated: Secondary | ICD-10-CM | POA: Diagnosis not present

## 2015-01-24 DIAGNOSIS — E785 Hyperlipidemia, unspecified: Secondary | ICD-10-CM | POA: Diagnosis not present

## 2015-04-05 ENCOUNTER — Encounter: Payer: Self-pay | Admitting: Neurology

## 2015-04-05 ENCOUNTER — Ambulatory Visit (INDEPENDENT_AMBULATORY_CARE_PROVIDER_SITE_OTHER): Payer: Medicare Other | Admitting: Neurology

## 2015-04-05 VITALS — BP 130/78 | HR 57 | Ht 71.0 in | Wt 164.4 lb

## 2015-04-05 DIAGNOSIS — R413 Other amnesia: Secondary | ICD-10-CM | POA: Diagnosis not present

## 2015-04-05 MED ORDER — GABAPENTIN 100 MG PO CAPS: 100.0000 mg | ORAL_CAPSULE | Freq: Three times a day (TID) | ORAL | Status: DC

## 2015-04-05 MED ORDER — MEMANTINE HCL 28 X 5 MG & 21 X 10 MG PO TABS: ORAL_TABLET | ORAL | Status: DC

## 2015-04-05 NOTE — Progress Notes (Signed)
Reason for visit: Memory disturbance  Andre Ball is an 74 y.o. male  History of present illness:  Mr. Andre Ball is a 74 year old right handed black male with a history of a progressive memory disturbance. He is on Aricept at a 10 mg dose, and this is well tolerated. He indicates that there has not been much change in his memory since last seen. The patient has developed some burning sensations in the bottom of the feet bilaterally that is more noticeable when he is off of his feet, less noticeable when he is walking. This does not keep him awake at night. The patient believes that he would like to be on some medication to reduce the discomfort, however. The patient denies any significant changes in balance. He comes to this office for further evaluation. He is not having any diarrhea or weight loss on Aricept.  Past Medical History  Diagnosis Date  . Asthma   . BPH (benign prostatic hyperplasia)   . Memory deficits 11/01/2014    Past Surgical History  Procedure Laterality Date  . Tonsillectomy      Family History  Problem Relation Age of Onset  . Alzheimer's disease Mother   . Alzheimer's disease Father   . Diabetes Brother   . Alzheimer's disease Sister   . Heart disease Brother   . Diabetes Brother   . Alzheimer's disease Sister   . Alzheimer's disease Brother   . Alzheimer's disease Brother     Social history:  reports that he has quit smoking. He has never used smokeless tobacco. He reports that he drinks about 1.8 oz of alcohol per week. He reports that he does not use illicit drugs.   No Known Allergies  Medications:  Prior to Admission medications   Medication Sig Start Date End Date Taking? Authorizing Provider  aspirin 81 MG tablet Take 81 mg by mouth daily.   Yes Historical Provider, MD  donepezil (ARICEPT) 10 MG tablet Take 1 tablet (10 mg total) by mouth at bedtime. 11/01/14  Yes York Spanielharles K Merica Prell, MD  finasteride (PROSCAR) 5 MG tablet Take 5 mg by mouth  daily. 09/30/14  Yes Historical Provider, MD  fluticasone (FLONASE) 50 MCG/ACT nasal spray Place 2 sprays into the nose daily. 01/30/13  Yes Reuben Likesavid C Keller, MD  montelukast (SINGULAIR) 10 MG tablet Take 10 mg by mouth at bedtime.   Yes Historical Provider, MD  Multiple Vitamin (MULTIVITAMIN) tablet Take 1 tablet by mouth daily.   Yes Historical Provider, MD  sertraline (ZOLOFT) 25 MG tablet Take 25 mg by mouth daily.   Yes Historical Provider, MD  tamsulosin (FLOMAX) 0.4 MG CAPS Take 0.4 mg by mouth daily.   Yes Historical Provider, MD  gabapentin (NEURONTIN) 100 MG capsule Take 1 capsule (100 mg total) by mouth 3 (three) times daily. 04/05/15   York Spanielharles K Starling Jessie, MD  memantine [facility](NAMENDA TITRATION PAK) tablet pack 5 mg/day for =1 week; 5 mg twice daily for =1 week; 15 mg/day given in 5 mg and 10 mg separated doses for =1 week; then 10 mg twice daily 04/05/15   York Spanielharles K Gedeon Brandow, MD    ROS:  Out of a complete 14 system review of symptoms, the patient complains only of the following symptoms, and all other reviewed systems are negative.  Runny nose Shortness of breath Chest pain, palpitations of the heart Frequent waking, daytime sleepiness, snoring Environmental allergies Frequency of urination Joint pain, back pain, neck stiffness Moles, itching Memory loss, headache  Agitation, confusion, anxiety  Blood pressure 130/78, pulse 57, height  (1.803 m), weight 164 lb 6.4 oz (74.571 kg).  Physical Exam  General: The patient is alert and cooperative at the time of the examination.  Skin: No significant peripheral edema is noted.   Neurologic Exam  Mental status: The patient is alert and oriented x 2 at the time of the examination (patient is not oriented to date). The patient has apparent normal recent and remote memory, with an apparently normal attention span and concentration ability. Mini-Mental Status Examination shows a total score of 25/30. The patient is able to name 10 animals in 30  seconds.   Cranial nerves: Facial symmetry is present. Speech is normal, no aphasia or dysarthria is noted. Extraocular movements are full. Visual fields are full.  Motor: The patient has good strength in all 4 extremities.  Sensory examination: Soft touch sensation is symmetric on the face, arms, and legs.  Coordination: The patient has good finger-nose-finger and heel-to-shin bilaterally. Apraxia with the use of the extremities is noted.  Gait and station: The patient has a normal gait. Tandem gait is normal. Romberg is negative. No drift is seen.  Reflexes: Deep tendon reflexes are symmetric.   MRI brain 12/03/2014:  IMPRESSION:  Abnormal MRI brain (without) demonstrating: 1. Severe right and moderate-severe left mesial temporal atrophy.  2. 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 Autoimmune, inflammatory or post-infectious etiologies less likely.  3. 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 4. No acute findings.   Assessment/Plan:  1. Progressive memory disturbance  2. 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 He will have Namenda added to the regimen for the memory, a titration pack was ordered. The patient will follow-up through this office in about 6 months. They will call me in one month if he is tolerating Namenda, a maintenance dose will be called in. The patient has a prominent family history of dementia, MRI of the brain did show some small vessel disease, the patient is on aspirin. The patient is interested in Alzheimer's research.  Marlan Palau MD 04/05/2015 7:33 PM  [facility] [address] [place], [place] [address]  Phone [phone] Fax [phone]

## 2015-04-05 NOTE — Patient Instructions (Signed)

## 2015-04-09 ENCOUNTER — Other Ambulatory Visit: Payer: Self-pay | Admitting: Neurology

## 2015-04-19 DIAGNOSIS — E785 Hyperlipidemia, unspecified: Secondary | ICD-10-CM | POA: Diagnosis not present

## 2015-05-06 ENCOUNTER — Other Ambulatory Visit: Payer: Self-pay | Admitting: Neurology

## 2015-05-07 MED ORDER — MEMANTINE HCL 10 MG PO TABS: 10.0000 mg | ORAL_TABLET | Freq: Two times a day (BID) | ORAL | Status: DC

## 2015-05-10 DIAGNOSIS — G309 Alzheimer's disease, unspecified: Secondary | ICD-10-CM | POA: Diagnosis not present

## 2015-05-10 DIAGNOSIS — I499 Cardiac arrhythmia, unspecified: Secondary | ICD-10-CM | POA: Diagnosis not present

## 2015-07-18 DIAGNOSIS — M79672 Pain in left foot: Secondary | ICD-10-CM | POA: Diagnosis not present

## 2015-07-18 DIAGNOSIS — J45909 Unspecified asthma, uncomplicated: Secondary | ICD-10-CM | POA: Diagnosis not present

## 2015-07-20 ENCOUNTER — Telehealth: Payer: Self-pay | Admitting: Neurology

## 2015-07-20 NOTE — Telephone Encounter (Signed)
Patient called requesting to move December appointment with Dr. Anne Hahn up sooner. No available appointments prior to December appt. Patient feels like his memory is getting worse. Please call patient after 11:00am today.

## 2015-07-20 NOTE — Telephone Encounter (Signed)
Called pt.  Made appt for him with CM/NP tomorrow at 1500 to evaluate.

## 2015-07-21 ENCOUNTER — Ambulatory Visit: Payer: Self-pay | Admitting: Nurse Practitioner

## 2015-07-21 DIAGNOSIS — M79671 Pain in right foot: Secondary | ICD-10-CM | POA: Diagnosis not present

## 2015-07-21 DIAGNOSIS — G603 Idiopathic progressive neuropathy: Secondary | ICD-10-CM | POA: Diagnosis not present

## 2015-07-21 DIAGNOSIS — G609 Hereditary and idiopathic neuropathy, unspecified: Secondary | ICD-10-CM | POA: Diagnosis not present

## 2015-07-21 DIAGNOSIS — M79672 Pain in left foot: Secondary | ICD-10-CM | POA: Diagnosis not present

## 2015-07-22 ENCOUNTER — Ambulatory Visit: Payer: Medicare Other | Admitting: Podiatry

## 2015-07-22 ENCOUNTER — Encounter: Payer: Self-pay | Admitting: Nurse Practitioner

## 2015-07-28 DIAGNOSIS — Z23 Encounter for immunization: Secondary | ICD-10-CM | POA: Diagnosis not present

## 2015-08-01 ENCOUNTER — Encounter: Payer: Self-pay | Admitting: Nurse Practitioner

## 2015-08-01 ENCOUNTER — Ambulatory Visit (INDEPENDENT_AMBULATORY_CARE_PROVIDER_SITE_OTHER): Payer: Medicare Other | Admitting: Nurse Practitioner

## 2015-08-01 VITALS — BP 121/65 | HR 70 | Ht 71.0 in | Wt 165.6 lb

## 2015-08-01 DIAGNOSIS — R413 Other amnesia: Secondary | ICD-10-CM

## 2015-08-01 MED ORDER — MEMANTINE HCL 10 MG PO TABS: 10.0000 mg | ORAL_TABLET | Freq: Two times a day (BID) | ORAL | Status: DC

## 2015-08-01 MED ORDER — DONEPEZIL HCL 10 MG PO TABS: 10.0000 mg | ORAL_TABLET | Freq: Every day | ORAL | Status: DC

## 2015-08-01 MED ORDER — GABAPENTIN 100 MG PO CAPS: 100.0000 mg | ORAL_CAPSULE | Freq: Three times a day (TID) | ORAL | Status: DC

## 2015-08-01 NOTE — Patient Instructions (Signed)
Continue Namenda and Aricept at current dose Continue exercise by walking  Exercise the brain by doing puzzles, card games, chess etc. F/U yearly

## 2015-08-01 NOTE — Progress Notes (Signed)
I have reviewed and agreed above plan. 

## 2015-08-01 NOTE — Progress Notes (Signed)
[facility]  PATIENT: Andre Ball DOB: March 30, 1941   REASON FOR VISIT: Follow-up for worsening memory loss, peripheral neuropathy HISTORY FROM: Patient and wife    HISTORY OF PRESENT ILLNESS:Andre Ball is a 74 year old right handed black male with a history of a progressive memory disturbance. He was last seen in this office by Dr. Anne Hahn 04/05/2015 He is on Aricept at a 10 mg dose, and this is well tolerated and he is on Namenda 10 mg twice daily. He denies side effects to the medications. He called for a earlier follow-up appointment because he felt his memory was getting worse . The patient has developed some burning sensations in the bottom of the feet bilaterally that is more noticeable when he is off of his feet, less noticeable when he is walking. This does not keep him awake at night. He continues to remain very active, goes to the [facility] and to the [employer]. The patient denies any significant changes in balance. He comes to this office for further evaluation.    REVIEW OF SYSTEMS: Full 14 system review of systems performed and notable only for those listed, all others are neg:  Constitutional: neg  Cardiovascular: neg Ear/Nose/Throat: neg  Skin: neg Eyes: neg Respiratory: Shortness of breath Gastroitestinal: Urinary frequency Hematology/Lymphatic: neg  Endocrine: neg Musculoskeletal: Joint pain Allergy/Immunology: neg Neurological: Memory loss and dizziness headache Psychiatric: neg Sleep : neg   ALLERGIES: No Known Allergies  HOME MEDICATIONS: Outpatient Prescriptions Prior to Visit  Medication Sig Dispense Refill  . aspirin 81 MG tablet Take 81 mg by mouth daily. Coated aspirin    . donepezil (ARICEPT) 10 MG tablet TAKE 1 TABLET BY MOUTH AT BEDTIME 30 tablet 5  . finasteride (PROSCAR) 5 MG tablet Take 5 mg by mouth daily.  3  . fluticasone (FLONASE) 50 MCG/ACT nasal spray Place 2 sprays into the nose daily. 16 g 0  .  gabapentin (NEURONTIN) 100 MG capsule Take 1 capsule (100 mg total) by mouth 3 (three) times daily. 60 capsule 3  . memantine (NAMENDA TITRATION PAK) tablet pack 5 mg/day for =1 week; 5 mg twice daily for =1 week; 15 mg/day given in 5 mg and 10 mg separated doses for =1 week; then 10 mg twice daily 120 tablet 0  . memantine (NAMENDA) 10 MG tablet Take 1 tablet (10 mg total) by mouth 2 (two) times daily. 60 tablet 3  . montelukast (SINGULAIR) 10 MG tablet Take 10 mg by mouth at bedtime.    . Multiple Vitamin (MULTIVITAMIN) tablet Take 1 tablet by mouth daily.    . sertraline (ZOLOFT) 25 MG tablet Take 25 mg by mouth daily.    . tamsulosin (FLOMAX) 0.4 MG CAPS Take 0.4 mg by mouth daily.     No facility-administered medications prior to visit.    PAST MEDICAL HISTORY: Past Medical History  Diagnosis Date  . Asthma   . BPH (benign prostatic hyperplasia)   . Memory deficits 11/01/2014    PAST SURGICAL HISTORY: Past Surgical History  Procedure Laterality Date  . Tonsillectomy      FAMILY HISTORY: Family History  Problem Relation Age of Onset  . Alzheimer's disease Mother   . Alzheimer's disease Father   . Diabetes Brother   . Alzheimer's disease Sister   . Heart disease Brother   . Diabetes Brother   . Alzheimer's disease Sister   . Alzheimer's disease Brother   . Alzheimer's disease Brother  SOCIAL HISTORY: Social History   Social History  . Marital Status: Married    Spouse Name: N/A  . Number of Children: 2  . Years of Education: college de   Occupational History  . Not on file.   Social History Main Topics  . Smoking status: Former Games developer  . Smokeless tobacco: Never Used  . Alcohol Use: 1.8 oz/week    3 Glasses of wine per week     Comment: occasionally  . Drug Use: No  . Sexual Activity: Not on file   Other Topics Concern  . Not on file   Social History Narrative   Patient is right handed.   Patient does not drink caffeine.     PHYSICAL  EXAM  Filed Vitals:   08/01/15 1128  BP: 121/65  Pulse: 70  Height:  (1.803 m)  Weight: 165 lb 9.6 oz (75.116 kg)   Body mass index is 23.11 kg/(m^2). General: The patient is alert and cooperative at the time of the examination. Skin: No significant peripheral edema is noted.   Neurologic Exam  Mental status: The patient is alert and oriented x 3.Mini Mental Status Examination shows a total score of 26/30. Last 26/30. The patient is able to name 10 animals in 30 seconds. Cranial nerves: Facial symmetry is present. Speech is normal, no aphasia or dysarthria is noted. Extraocular movements are full. Visual fields are full. Motor: The patient has good strength in all 4 extremities. Sensory examination: Soft touch sensation is symmetric on the face, arms, and legs. Coordination: The patient has good finger-nose-finger and heel-to-shin bilaterally. Apraxia with the use of the extremities is noted. Gait and station: The patient has a normal gait. Tandem gait is normal. Romberg is negative. No drift is seen. Reflexes: Deep tendon reflexes are symmetric upper and lower.  DIAGNOSTIC DATA (LABS, IMAGING, TESTING)   Lab Results  Component Value Date   VITAMINB12 700 11/01/2014   Lab Results  Component Value Date   TSH 2.040 11/01/2014      ASSESSMENT AND PLAN  74 y.o. year old male  has a past medical history of Memory deficits (11/01/2014). And peripheral neuropathy here to follow-up.The patient is a current patient of Dr. Anne Hahn   who is out of the office today . This note is sent to the work in doctor.     Continue Namenda and Aricept at current dose will refill Continue exercise by walking  Exercise the brain by doing puzzles, card games, chess etc. I spent 10 additional minutes in total face to face time with the patient and the wife more than 50% of which was spent counseling and coordination of care, reviewing test results reviewing medications and discussing and reviewing  the diagnosis of memory loss/Alzheimer's disease and further treatment options. Given the wife the name of a book "The 36 hour day" which discusses how to be a caregiver for a patient with memory loss. Also discussed long-range planning and the goal at this point is to keep the patient at home as long as possible Continue Gabapentin for neuropathy symptoms. F/U in 6 months for repeat memory testing Vst time 27 min Nilda Riggs, [facility], APRN  [facility] [address], [address] [place], [place] [address] [phone]

## 2015-08-02 DIAGNOSIS — G603 Idiopathic progressive neuropathy: Secondary | ICD-10-CM | POA: Diagnosis not present

## 2015-08-02 DIAGNOSIS — M79672 Pain in left foot: Secondary | ICD-10-CM | POA: Diagnosis not present

## 2015-08-02 DIAGNOSIS — M79671 Pain in right foot: Secondary | ICD-10-CM | POA: Diagnosis not present

## 2015-08-12 DIAGNOSIS — S300XXA Contusion of lower back and pelvis, initial encounter: Secondary | ICD-10-CM | POA: Diagnosis not present

## 2015-08-12 DIAGNOSIS — S7002XA Contusion of left hip, initial encounter: Secondary | ICD-10-CM | POA: Diagnosis not present

## 2015-08-16 DIAGNOSIS — G603 Idiopathic progressive neuropathy: Secondary | ICD-10-CM | POA: Diagnosis not present

## 2015-08-16 DIAGNOSIS — M79671 Pain in right foot: Secondary | ICD-10-CM | POA: Diagnosis not present

## 2015-08-16 DIAGNOSIS — M79672 Pain in left foot: Secondary | ICD-10-CM | POA: Diagnosis not present

## 2015-09-19 DIAGNOSIS — E785 Hyperlipidemia, unspecified: Secondary | ICD-10-CM | POA: Diagnosis not present

## 2015-09-19 DIAGNOSIS — Z6825 Body mass index (BMI) 25.0-25.9, adult: Secondary | ICD-10-CM | POA: Diagnosis not present

## 2015-09-19 DIAGNOSIS — J45909 Unspecified asthma, uncomplicated: Secondary | ICD-10-CM | POA: Diagnosis not present

## 2015-10-03 DIAGNOSIS — R350 Frequency of micturition: Secondary | ICD-10-CM | POA: Diagnosis not present

## 2015-10-03 DIAGNOSIS — N476 Balanoposthitis: Secondary | ICD-10-CM | POA: Diagnosis not present

## 2015-10-03 DIAGNOSIS — N400 Enlarged prostate without lower urinary tract symptoms: Secondary | ICD-10-CM | POA: Diagnosis not present

## 2015-10-06 ENCOUNTER — Encounter: Payer: Self-pay | Admitting: Neurology

## 2015-10-06 ENCOUNTER — Ambulatory Visit (INDEPENDENT_AMBULATORY_CARE_PROVIDER_SITE_OTHER): Payer: Medicare Other | Admitting: Neurology

## 2015-10-06 VITALS — BP 133/75 | HR 54 | Ht 71.0 in | Wt 168.0 lb

## 2015-10-06 DIAGNOSIS — R413 Other amnesia: Secondary | ICD-10-CM | POA: Diagnosis not present

## 2015-10-06 NOTE — Progress Notes (Signed)
Reason for visit: Memory disturbance  Andre Ball is an 74 y.o. 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 The patient has had some ongoing memory issues since last seen. He is having difficulty managing the checkbook, and paying the bills. He is requiring some assistance from his wife at this point. He is not sleeping well oftentimes at night, but he naps throughout the day. He is on Namenda and Aricept, tolerating the medications fairly well. He takes an over-the-counter sleeping pill at night. The patient is interested in getting involved with clinical research in regards to his memory issues.   Past Medical History  Diagnosis Date  . Asthma   . BPH (benign prostatic hyperplasia)   . Memory deficits 11/01/2014    Past Surgical History  Procedure Laterality Date  . Tonsillectomy      Family History  Problem Relation Age of Onset  . Alzheimer's disease Mother   . Alzheimer's disease Father   . Diabetes Brother   . Alzheimer's disease Sister   . Heart disease Brother   . Diabetes Brother   . Alzheimer's disease Sister   . Alzheimer's disease Brother   . Alzheimer's disease Brother     Social history:  reports that he has quit smoking. He has never used smokeless tobacco. He reports that he does not drink alcohol or use illicit drugs.   No Known Allergies  Medications:  Prior to Admission medications   Medication Sig Start Date End Date Taking? Authorizing Provider  aspirin 81 MG tablet Take 81 mg by mouth daily. Coated aspirin   Yes Historical Provider, MD  donepezil (ARICEPT) 10 MG tablet Take 1 tablet (10 mg total) by mouth at bedtime. 08/01/15  Yes Nilda RiggsNancy Carolyn Martin, NP  finasteride (PROSCAR) 5 MG tablet Take 5 mg by mouth daily. 09/30/14  Yes Historical Provider, MD  fluticasone (FLONASE) 50 MCG/ACT nasal spray Place 2 sprays into  the nose daily. 01/30/13  Yes Reuben Likesavid C Keller, MD  gabapentin (NEURONTIN) 300 MG capsule Take 300 mg by mouth 2 (two) times daily. 09/12/15  Yes Historical Provider, MD  memantine (NAMENDA) 10 MG tablet Take 1 tablet (10 mg total) by mouth 2 (two) times daily. 08/01/15  Yes Nilda RiggsNancy Carolyn Martin, NP  montelukast (SINGULAIR) 10 MG tablet Take 10 mg by mouth at bedtime.   Yes Historical Provider, MD  Multiple Vitamin (MULTIVITAMIN) tablet Take 1 tablet by mouth daily.   Yes Historical Provider, MD  sertraline (ZOLOFT) 25 MG tablet Take 25 mg by mouth daily.   Yes Historical Provider, MD  tamsulosin (FLOMAX) 0.4 MG CAPS Take 0.4 mg by mouth daily.   Yes Historical Provider, MD    ROS:  Out of a complete 14 system review of symptoms, the patient complains only of the following symptoms, and all other reviewed systems are negative.  Runny nose Shortness of breath Insomnia Environmental allergies Frequency of urination Joint pain Memory loss, tremors Agitation, confusion, anxiety  Blood pressure 133/75, pulse 54, height 5\' 11"  (1.803 m), weight 168 lb (76.204 kg).  Physical Exam  General: The patient is alert and cooperative at the time of the examination.  Skin: No significant peripheral edema is noted.   Neurologic Exam  Mental status: The patient is alert and oriented x 2 at the time of the examination (not oriented to date). The Mini-Mental Status Examination done today  shows a total score 25/30. The patient is able to name 11 animals in 30 seconds.   Cranial nerves: Facial symmetry is present. Speech is normal, no aphasia or dysarthria is noted. Extraocular movements are full. Visual fields are full.  Motor: The patient has good strength in all 4 extremities.  Sensory examination: Soft touch sensation is symmetric on the face, arms, and legs.  Coordination: The patient has good finger-nose-finger and heel-to-shin bilaterally.  Gait and station: The patient has a normal gait.  Tandem gait is slightly unsteady. Romberg is negative. No drift is seen.  Reflexes: Deep tendon reflexes are symmetric.   Assessment/Plan:  1. Progressive memory disturbance, Alzheimer's disease  The patient will continue the Aricept and Namenda. He is interested in clinical research, I will refer him for this. He will follow-up in 6 months, sooner if needed.   Marlan Palau MD 10/06/2015 7:06 PM  [facility] [address] [place], [place] [address]  Phone [phone] Fax [phone]

## 2015-10-10 ENCOUNTER — Telehealth: Payer: Self-pay

## 2015-10-10 NOTE — Telephone Encounter (Signed)
I left a message for the patient to return my call.

## 2015-10-11 ENCOUNTER — Telehealth: Payer: Self-pay

## 2015-10-11 NOTE — Telephone Encounter (Signed)
I spoke to the patient in regards to the CREAD study. The patient is interested in participating but would like to find out more information. I will mail the Informed Consent Form (ICF) today. The patient mentioned that he had participated in a research study before (~ 4 years ago) to treat his memory. He stated that he did take some study medication for this. I told him that I would check with the doctor to see if this would exclude him from the study.

## 2015-10-14 ENCOUNTER — Telehealth: Payer: Self-pay

## 2015-10-14 NOTE — Telephone Encounter (Signed)
I spoke to the patient's wife, Andre FuelBarbara Chittick, in regards to the CREAD study. I asked her about the study that the patient participated in a few years ago, and she stated that he did not receive any study medication, but it was a written test. I let her know that he qualifies for the study. However, she would like to know more information about it. Therefore, I will mail the Informed Consent Form (ICF) and will check with them within two weeks.

## 2015-10-20 ENCOUNTER — Telehealth: Payer: Self-pay

## 2015-10-20 NOTE — Telephone Encounter (Signed)
I spoke to the patient in regards to the CREAD study. The patient stated that he does not want to participate due to the long duration of the study and the side effects described in the Informed Consent Form. I thanked him for his time and consideration.

## 2015-10-28 DIAGNOSIS — H2513 Age-related nuclear cataract, bilateral: Secondary | ICD-10-CM | POA: Diagnosis not present

## 2015-10-28 DIAGNOSIS — H04123 Dry eye syndrome of bilateral lacrimal glands: Secondary | ICD-10-CM | POA: Diagnosis not present

## 2015-10-28 DIAGNOSIS — H25013 Cortical age-related cataract, bilateral: Secondary | ICD-10-CM | POA: Diagnosis not present

## 2015-10-28 DIAGNOSIS — H18413 Arcus senilis, bilateral: Secondary | ICD-10-CM | POA: Diagnosis not present

## 2015-10-28 DIAGNOSIS — H40013 Open angle with borderline findings, low risk, bilateral: Secondary | ICD-10-CM | POA: Diagnosis not present

## 2015-10-28 DIAGNOSIS — Z6825 Body mass index (BMI) 25.0-25.9, adult: Secondary | ICD-10-CM | POA: Diagnosis not present

## 2015-10-28 DIAGNOSIS — H11423 Conjunctival edema, bilateral: Secondary | ICD-10-CM | POA: Diagnosis not present

## 2015-10-28 DIAGNOSIS — R066 Hiccough: Secondary | ICD-10-CM | POA: Diagnosis not present

## 2015-10-28 DIAGNOSIS — H1132 Conjunctival hemorrhage, left eye: Secondary | ICD-10-CM | POA: Diagnosis not present

## 2015-11-19 DIAGNOSIS — G609 Hereditary and idiopathic neuropathy, unspecified: Secondary | ICD-10-CM | POA: Diagnosis not present

## 2015-11-22 DIAGNOSIS — G309 Alzheimer's disease, unspecified: Secondary | ICD-10-CM | POA: Diagnosis not present

## 2015-11-22 DIAGNOSIS — I499 Cardiac arrhythmia, unspecified: Secondary | ICD-10-CM | POA: Diagnosis not present

## 2015-11-24 DIAGNOSIS — M79672 Pain in left foot: Secondary | ICD-10-CM | POA: Diagnosis not present

## 2015-11-24 DIAGNOSIS — G603 Idiopathic progressive neuropathy: Secondary | ICD-10-CM | POA: Diagnosis not present

## 2015-11-24 DIAGNOSIS — M79671 Pain in right foot: Secondary | ICD-10-CM | POA: Diagnosis not present

## 2015-12-20 DIAGNOSIS — M79671 Pain in right foot: Secondary | ICD-10-CM | POA: Diagnosis not present

## 2015-12-20 DIAGNOSIS — H2513 Age-related nuclear cataract, bilateral: Secondary | ICD-10-CM | POA: Diagnosis not present

## 2015-12-20 DIAGNOSIS — G603 Idiopathic progressive neuropathy: Secondary | ICD-10-CM | POA: Diagnosis not present

## 2015-12-20 DIAGNOSIS — M79672 Pain in left foot: Secondary | ICD-10-CM | POA: Diagnosis not present

## 2015-12-27 DIAGNOSIS — G9009 Other idiopathic peripheral autonomic neuropathy: Secondary | ICD-10-CM | POA: Diagnosis not present

## 2015-12-27 DIAGNOSIS — E785 Hyperlipidemia, unspecified: Secondary | ICD-10-CM | POA: Diagnosis not present

## 2016-01-03 DIAGNOSIS — M79671 Pain in right foot: Secondary | ICD-10-CM | POA: Diagnosis not present

## 2016-01-03 DIAGNOSIS — G603 Idiopathic progressive neuropathy: Secondary | ICD-10-CM | POA: Diagnosis not present

## 2016-01-03 DIAGNOSIS — M79672 Pain in left foot: Secondary | ICD-10-CM | POA: Diagnosis not present

## 2016-02-14 DIAGNOSIS — M79672 Pain in left foot: Secondary | ICD-10-CM | POA: Diagnosis not present

## 2016-02-14 DIAGNOSIS — M79671 Pain in right foot: Secondary | ICD-10-CM | POA: Diagnosis not present

## 2016-02-14 DIAGNOSIS — G603 Idiopathic progressive neuropathy: Secondary | ICD-10-CM | POA: Diagnosis not present

## 2016-02-27 ENCOUNTER — Encounter: Payer: Self-pay | Admitting: Nurse Practitioner

## 2016-02-27 ENCOUNTER — Ambulatory Visit (INDEPENDENT_AMBULATORY_CARE_PROVIDER_SITE_OTHER): Payer: Medicare Other | Admitting: Nurse Practitioner

## 2016-02-27 ENCOUNTER — Encounter (INDEPENDENT_AMBULATORY_CARE_PROVIDER_SITE_OTHER): Payer: Medicare Other | Admitting: Nurse Practitioner

## 2016-02-27 VITALS — BP 130/70 | HR 60 | Ht 71.0 in | Wt 172.6 lb

## 2016-02-27 DIAGNOSIS — Z0289 Encounter for other administrative examinations: Secondary | ICD-10-CM

## 2016-02-27 DIAGNOSIS — R413 Other amnesia: Secondary | ICD-10-CM

## 2016-02-27 MED ORDER — DONEPEZIL HCL 10 MG PO TABS: 10.0000 mg | ORAL_TABLET | Freq: Every day | ORAL | Status: DC

## 2016-02-27 MED ORDER — MEMANTINE HCL 10 MG PO TABS: 10.0000 mg | ORAL_TABLET | Freq: Two times a day (BID) | ORAL | Status: DC

## 2016-02-27 NOTE — Progress Notes (Signed)
This encounter was created in error - please disregard.

## 2016-02-27 NOTE — Patient Instructions (Addendum)
Continue Aricept and Namenda at current dose will refill  Follow sleep principles Follow-up with us in 6 months

## 2016-02-27 NOTE — Progress Notes (Signed)
I have read the note, and I agree with the clinical assessment and plan.  WILLIS,CHARLES KEITH   

## 2016-02-27 NOTE — Progress Notes (Signed)
[facility]  PATIENT: Andre Ball DOB: 1941/05/13   REASON FOR VISIT: follow up for memory disorder HISTORY FROM:Patient and wife    HISTORY OF PRESENT ILLNESS:Mr. 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 He  is having difficulty managing the checkbook, and paying the bills. He is requiring some assistance from his wife. He is not sleeping well oftentimes at night, but he naps throughout the day. He does not have a regular schedule for going to bed or waking up. He is on Namenda and Aricept, tolerating the medications fairly well. He takes an over-the-counter sleeping pill at night. The patient is not  interested in getting involved with clinical research in regards to his memory issues. He returns for reevaluation   REVIEW OF SYSTEMS: Full 14 system review of systems performed and notable only for those listed, all others are neg:  Constitutional: neg  Cardiovascular: neg Ear/Nose/Throat: neg  Skin: neg Eyes: neg Respiratory: neg Gastroitestinal: neg  Hematology/Lymphatic: neg  Endocrine: neg Musculoskeletal:neg Allergy/Immunology: neg Neurological: neg Psychiatric: neg Sleep : neg   ALLERGIES: No Known Allergies  HOME MEDICATIONS: Outpatient Prescriptions Prior to Visit  Medication Sig Dispense Refill  . donepezil (ARICEPT) 10 MG tablet Take 1 tablet (10 mg total) by mouth at bedtime. 30 tablet 6  . finasteride (PROSCAR) 5 MG tablet Take 5 mg by mouth daily.  3  . fluticasone (FLONASE) 50 MCG/ACT nasal spray Place 2 sprays into the nose daily. 16 g 0  . gabapentin (NEURONTIN) 300 MG capsule Take 300 mg by mouth 2 (two) times daily.  5  . memantine (NAMENDA) 10 MG tablet Take 1 tablet (10 mg total) by mouth 2 (two) times daily. 60 tablet 6  . montelukast (SINGULAIR) 10 MG tablet Take 10 mg by mouth at bedtime.    . Multiple Vitamin (MULTIVITAMIN) tablet  Take 1 tablet by mouth daily.    . sertraline (ZOLOFT) 25 MG tablet Take 25 mg by mouth daily.    . tamsulosin (FLOMAX) 0.4 MG CAPS Take 0.4 mg by mouth daily.     No facility-administered medications prior to visit.    PAST MEDICAL HISTORY: Past Medical History  Diagnosis Date  . Asthma   . BPH (benign prostatic hyperplasia)   . Memory deficits 11/01/2014    PAST SURGICAL HISTORY: Past Surgical History  Procedure Laterality Date  . Tonsillectomy      FAMILY HISTORY: Family History  Problem Relation Age of Onset  . Alzheimer's disease Mother   . Alzheimer's disease Father   . Diabetes Brother   . Alzheimer's disease Sister   . Heart disease Brother   . Diabetes Brother   . Alzheimer's disease Sister   . Alzheimer's disease Brother   . Alzheimer's disease Brother     SOCIAL HISTORY: Social History   Social History  . Marital Status: Married    Spouse Name: N/A  . Number of Children: 2  . Years of Education: college de   Occupational History  . Not on file.   Social History Main Topics  . Smoking status: Former Games developermoker  . Smokeless tobacco: Never Used  . Alcohol Use: No     Comment: occasionally  . Drug Use: No  . Sexual Activity: Not on file   Other Topics Concern  . Not on file   Social History Narrative   Patient is right handed.   Patient does not drink caffeine.  PHYSICAL EXAM  Filed Vitals:   02/27/16 1307  BP: 130/70  Pulse: 60  Height:  (1.803 m)  Weight: 172 lb 9.6 oz (78.291 kg)   Body mass index is 24.08 kg/(m^2).  Generalized: Well developed, in no acute distress  Head: normocephalic and atraumatic,. Oropharynx benign  Neck: Supple, no carotid bruits  Cardiac: Regular rate rhythm, no murmur  Musculoskeletal: No deformity   Neurological examination   Mentation: Alert oriented to time, place, history taking. Attention span and concentration appropriate. MMSE 25/30Missing 3 of 3 recall and 2 items in calculation. AFT 7.  Clock drawing 2/4. Follows all commands speech and language fluent.   Cranial nerve II-XII: Pupils were equal round reactive to light extraocular movements were full, visual field were full on confrontational test. Facial sensation and strength were normal. hearing was intact to finger rubbing bilaterally. Uvula tongue midline. head turning and shoulder shrug were normal and symmetric.Tongue protrusion into cheek strength was normal. Motor: normal bulk and tone, full strength in the BUE, BLE, fine finger movements normal, no pronator drift. No focal weakness Coordination: finger-nose-finger, heel-to-shin bilaterally, no dysmetria Reflexes:Symmetric upper and lower  plantar responses were flexor bilaterally. Gait and Station: Rising up from seated position without assistance, normal stance,  moderate stride, good arm swing, smooth turning, able to perform tiptoe, and heel walking without difficulty. Tandem gait is steady  DIAGNOSTIC DATA (LABS, IMAGING, TESTING) -  No results found for: HGBA1C Lab Results  Component Value Date   VITAMINB12 700 11/01/2014   Lab Results  Component Value Date   TSH 2.040 11/01/2014      ASSESSMENT AND PLAN  75 y.o. year old male  has a past medical history of Asthma; BPH (benign prostatic hyperplasia); and Memory deficits (11/01/2014).. To follow-up for progressive memory disturbance.  Continue Aricept and Namenda at current dose will refill  Follow sleep principles given handout no caffeine or alcohol after 5 pm.  Follow-up with [place] in 6 months Nilda Riggs, [place], [facility], APRN  [facility] [address] [place], [place] [address] [phone]

## 2016-03-28 DIAGNOSIS — G9009 Other idiopathic peripheral autonomic neuropathy: Secondary | ICD-10-CM | POA: Diagnosis not present

## 2016-03-28 DIAGNOSIS — E785 Hyperlipidemia, unspecified: Secondary | ICD-10-CM | POA: Diagnosis not present

## 2016-03-28 DIAGNOSIS — Z6825 Body mass index (BMI) 25.0-25.9, adult: Secondary | ICD-10-CM | POA: Diagnosis not present

## 2016-03-28 DIAGNOSIS — J45909 Unspecified asthma, uncomplicated: Secondary | ICD-10-CM | POA: Diagnosis not present

## 2016-03-28 DIAGNOSIS — J45998 Other asthma: Secondary | ICD-10-CM | POA: Diagnosis not present

## 2016-04-04 DIAGNOSIS — N401 Enlarged prostate with lower urinary tract symptoms: Secondary | ICD-10-CM | POA: Diagnosis not present

## 2016-04-04 DIAGNOSIS — R350 Frequency of micturition: Secondary | ICD-10-CM | POA: Diagnosis not present

## 2016-04-25 ENCOUNTER — Ambulatory Visit: Payer: Medicare Other | Admitting: Podiatry

## 2016-04-27 ENCOUNTER — Ambulatory Visit (INDEPENDENT_AMBULATORY_CARE_PROVIDER_SITE_OTHER): Payer: Medicare Other

## 2016-04-27 ENCOUNTER — Encounter: Payer: Self-pay | Admitting: Podiatry

## 2016-04-27 ENCOUNTER — Ambulatory Visit (INDEPENDENT_AMBULATORY_CARE_PROVIDER_SITE_OTHER): Payer: Medicare Other | Admitting: Podiatry

## 2016-04-27 VITALS — BP 157/79 | HR 53 | Resp 14

## 2016-04-27 DIAGNOSIS — M205X9 Other deformities of toe(s) (acquired), unspecified foot: Secondary | ICD-10-CM

## 2016-04-27 DIAGNOSIS — M2010 Hallux valgus (acquired), unspecified foot: Secondary | ICD-10-CM | POA: Diagnosis not present

## 2016-04-27 DIAGNOSIS — R52 Pain, unspecified: Secondary | ICD-10-CM

## 2016-04-27 DIAGNOSIS — M2020 Hallux rigidus, unspecified foot: Secondary | ICD-10-CM

## 2016-04-27 MED ORDER — MELOXICAM 15 MG PO TABS: 15.0000 mg | ORAL_TABLET | Freq: Every day | ORAL | Status: DC

## 2016-04-27 NOTE — Progress Notes (Signed)
   Subjective:    Patient ID: Andre Ball, male    DOB: Feb 06, 1941, 75 y.o.   MRN: [id number]  HPI this patient presents to the office with chief complaint of pain all over his feet for over 1 year. He states that the pain occurs walking and wearing his shoes. He denies any swelling or redness in his feet. 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 He says the pain has improved using biofreeze this last week.Marland Kitchen.  He presents the office today for an evaluation and treatment of this foot problem    Review of Systems  All other systems reviewed and are negative.      Objective:   Physical Exam GENERAL APPEARANCE: Alert, conversant. Appropriately groomed. No acute distress.  VASCULAR: Pedal pulses are  [facility]pable at  [facility].  Capillary refill time is immediate to all digits,  Normal temperature gradient.  Digital hair growth is present bilateral  NEUROLOGIC: sensation is normal to 5.07 monofilament at 5/5 sites left foot. Diminished/Absent right foot.  Sherlynn Stalls.  Light touch is intact bilateral, Muscle strength normal. Vibratory WNL MUSCULOSKELETAL: acceptable muscle strength, tone and stability bilateral.  Intrinsic muscluature intact bilateral.  Rectus appearance of foot and digits noted bilateral. Hallux limitus 1st MPJ  B/L.  HAV  B/L  DERMATOLOGIC: skin color, texture, and turgor are within normal limits.  No preulcerative lesions or ulcers  are seen, no interdigital maceration noted.  No open lesions present.  Digital nails are asymptomatic. No drainage noted.         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  Dorsal crowning 1st MPJ  B/L   Powerstrides were recommended.  Mobic was prescribed.  RTC 4 weeks.   Helane GuntherGregory Michelle Vanhise DPM

## 2016-05-14 ENCOUNTER — Ambulatory Visit: Payer: Federal, State, Local not specified - PPO | Admitting: Neurology

## 2016-05-28 DIAGNOSIS — I499 Cardiac arrhythmia, unspecified: Secondary | ICD-10-CM | POA: Diagnosis not present

## 2016-05-28 DIAGNOSIS — I351 Nonrheumatic aortic (valve) insufficiency: Secondary | ICD-10-CM | POA: Diagnosis not present

## 2016-05-28 DIAGNOSIS — G309 Alzheimer's disease, unspecified: Secondary | ICD-10-CM | POA: Diagnosis not present

## 2016-06-08 ENCOUNTER — Encounter: Payer: Self-pay | Admitting: Podiatry

## 2016-06-08 ENCOUNTER — Ambulatory Visit (INDEPENDENT_AMBULATORY_CARE_PROVIDER_SITE_OTHER): Payer: Medicare Other | Admitting: Podiatry

## 2016-06-08 VITALS — BP 152/67 | HR 59 | Resp 14

## 2016-06-08 DIAGNOSIS — R52 Pain, unspecified: Secondary | ICD-10-CM | POA: Diagnosis not present

## 2016-06-08 DIAGNOSIS — M205X9 Other deformities of toe(s) (acquired), unspecified foot: Secondary | ICD-10-CM | POA: Diagnosis not present

## 2016-06-08 DIAGNOSIS — M2020 Hallux rigidus, unspecified foot: Principal | ICD-10-CM

## 2016-06-08 DIAGNOSIS — M2010 Hallux valgus (acquired), unspecified foot: Secondary | ICD-10-CM

## 2016-06-08 MED ORDER — OXAPROZIN 600 MG PO TABS: 600.0000 mg | ORAL_TABLET | Freq: Two times a day (BID) | ORAL | 0 refills | Status: DC

## 2016-06-08 NOTE — Progress Notes (Signed)
This patient presents to the office stating that his feet are still painful since his last office visit. He was diagnosed with a hallux limitus and hallux abductovalgus both feet. He was treated with power steps and prescribed Mobic. He states that there has been no improvement since the last time he was in the office. After reviewing his medical problems, he does have memory problems.  He presents the office for continued evaluation and treatment of his painful big toe joints both feet  GENERAL APPEARANCE: Alert, conversant. Appropriately groomed. No acute distress.  Memory deficit  Diagnosed. VASCULAR: Pedal pulses are  palpable at  [facility].  Capillary refill time is immediate to all digits,  Normal temperature gradient.   NEUROLOGIC: sensation is normal to 5.07 monofilament at 5/5 sites bilateral.  Light touch is intact bilateral, Muscle strength normal.  MUSCULOSKELETAL: acceptable muscle strength, tone and stability bilateral.  Intrinsic muscluature intact bilateral.  HAV  B/L with hallux limitus 1st MPJ  B/L.   DERMATOLOGIC: skin color, texture, and turgor are within normal limits.  No preulcerative lesions or ulcers  are seen, no interdigital maceration noted.  No open lesions present.  Digital nails are asymptomatic. No drainage noted.  Diagnosis  Hallux Limitus 1st MPJ  B/L.  HAV  B/L.  Tx.  ROV  Injection therapy.  Prescribe Daypro for this patient since he says the Mobic was ineffective.  RTC 1 week to evaluate injection therapy right foot.   Helane GuntherGregory Bliss Tsang DPM

## 2016-06-15 ENCOUNTER — Encounter: Payer: Self-pay | Admitting: Podiatry

## 2016-06-15 ENCOUNTER — Ambulatory Visit (INDEPENDENT_AMBULATORY_CARE_PROVIDER_SITE_OTHER): Payer: Medicare Other | Admitting: Podiatry

## 2016-06-15 DIAGNOSIS — M2010 Hallux valgus (acquired), unspecified foot: Secondary | ICD-10-CM

## 2016-06-15 DIAGNOSIS — M205X9 Other deformities of toe(s) (acquired), unspecified foot: Secondary | ICD-10-CM | POA: Diagnosis not present

## 2016-06-15 DIAGNOSIS — M2020 Hallux rigidus, unspecified foot: Principal | ICD-10-CM

## 2016-06-15 NOTE — Progress Notes (Signed)
This patient returns to the office 1 week after diagnosis of a hallux limitus first MPJ bilateral. He was treated with injection therapy in his left foot. He was prescribed Daypro to be taken by mouth. He presents the office today saying that his foot has improved a little, but not dramatically since his last visit. After  discussing his feet with him we realized he never picked up the Daypro. He presents to the office for evaluation and treatment.  GENERAL APPEARANCE: Alert, conversant. Appropriately groomed. No acute distress.  VASCULAR: Pedal pulses are  [facility]pable [facility]  [facility].  Capillary refill time is immediate to all digits,  Normal temperature gradient.  Digital hair growth is present bilateral  NEUROLOGIC: sensation is normal to 5.07 monofilament at 5/5 sites bilateral.  Light touch is intact bilateral, Muscle strength normal.  MUSCULOSKELETAL: acceptable muscle strength, tone and stability bilateral.  Intrinsic muscluature intact bilateral.  HAV  B/L  With hallux limitus.  DERMATOLOGIC: skin color, texture, and turgor are within normal limits.  No preulcerative lesions or ulcers  are seen, no interdigital maceration noted.  No open lesions present.  Digital nails are asymptomatic. No drainage noted.   Hallux limitus 1st MPJ  B/L   ROV  discussed with this patient. His foot problem. This patient is on Aricept for memory. We discussed obtaining Daypro for him to take for his painful feet as needed. He says he is not interested in surgical correction of his feet to eliminate this problem. Therefore, I told him to pick up the medicine and I would renew Daypro for him as needed.   Helane GuntherGregory Abhinav Mayorquin DPM

## 2016-06-18 ENCOUNTER — Telehealth: Payer: Self-pay | Admitting: *Deleted

## 2016-06-18 NOTE — Telephone Encounter (Addendum)
Pt's wife, Britta MccreedyBarbara state sthe medication Dr. Stacie AcresMayer ordered last Friday is too expensive $100.00. I asked her the name and she said she was not sure but was prescribed last Friday.  I reviewed chart and it may have been the Daypro.  Britta MccreedyBarbara states pt has gone to the store to get it and I told her to call tomorrow and even though pt purchased it I would see if Dr. Stacie AcresMayer would prescribe a less expensive medication next time.

## 2016-06-25 DIAGNOSIS — Z634 Disappearance and death of family member: Secondary | ICD-10-CM | POA: Diagnosis not present

## 2016-06-25 DIAGNOSIS — E785 Hyperlipidemia, unspecified: Secondary | ICD-10-CM | POA: Diagnosis not present

## 2016-06-25 DIAGNOSIS — J45909 Unspecified asthma, uncomplicated: Secondary | ICD-10-CM | POA: Diagnosis not present

## 2016-06-26 DIAGNOSIS — Z23 Encounter for immunization: Secondary | ICD-10-CM | POA: Diagnosis not present

## 2016-07-31 DIAGNOSIS — R350 Frequency of micturition: Secondary | ICD-10-CM | POA: Diagnosis not present

## 2016-08-07 ENCOUNTER — Telehealth: Payer: Self-pay | Admitting: Neurology

## 2016-08-07 NOTE — Telephone Encounter (Signed)
Wife called, states husband is digressing and she wants specialist in Alzheimer's to see her husband to assess his medications and needs more comprehensive consultation than what she has observed w/ NP. States he needs more than just Tribunearolyn asking him to repeat flag, tree, balloon. Patient is scheduled with NP, CM November 1st, Dr. Anne HahnWillis next availability is December 6th. Please call to advise.

## 2016-08-07 NOTE — Telephone Encounter (Signed)
Returned call to pt's wife. Sooner appt scheduled w/ Dr. Anne HahnWillis on 08/20/16 w/ noon arrival time.

## 2016-08-15 DIAGNOSIS — I351 Nonrheumatic aortic (valve) insufficiency: Secondary | ICD-10-CM | POA: Diagnosis not present

## 2016-08-17 ENCOUNTER — Telehealth: Payer: Self-pay | Admitting: Neurology

## 2016-08-17 NOTE — Telephone Encounter (Signed)
Spoke w/ pt's wife last week. Pt had an appt scheduled w/ Eber Jonesarolyn NP but wife requested sooner appt w/ MD to "assess his medications and for a comprehensive consultation." Pt scheduled for noon work-in appt next Monday w/ Dr. Anne HahnWillis.

## 2016-08-17 NOTE — Telephone Encounter (Signed)
Pts wife called in stating she is concerned about him driving. Pt has appt on Monday. States pts reaction time is slow and complains his feet hurt all the time and is afraid he is not using his feet to use the gas and break due to pain. She says she has to give him more direction when going places. She says that he will in general get lost walking around if he has to go to the bathroom. He does not know where to return to . Please call and advise [phone]

## 2016-08-20 ENCOUNTER — Encounter: Payer: Self-pay | Admitting: Neurology

## 2016-08-20 ENCOUNTER — Ambulatory Visit (INDEPENDENT_AMBULATORY_CARE_PROVIDER_SITE_OTHER): Payer: Medicare Other | Admitting: Neurology

## 2016-08-20 VITALS — BP 139/78 | HR 58 | Ht 66.0 in | Wt 166.0 lb

## 2016-08-20 DIAGNOSIS — R413 Other amnesia: Secondary | ICD-10-CM

## 2016-08-20 MED ORDER — DONEPEZIL HCL 10 MG PO TABS: 10.0000 mg | ORAL_TABLET | Freq: Every day | ORAL | 3 refills | Status: DC

## 2016-08-20 MED ORDER — MEMANTINE HCL 10 MG PO TABS: 10.0000 mg | ORAL_TABLET | Freq: Two times a day (BID) | ORAL | 3 refills | Status: DC

## 2016-08-20 NOTE — Progress Notes (Signed)
Reason for visit: Memory disturbance  Andre Ball is an 75 y.o. male  History of present illness:  Andre Ball is a 75 year old right-handed black male with a history of a memory disturbance. The patient is on Aricept and Namenda, the patient himself manages his own medications, the wife is not sure whether he takes his medications properly or not. The patient has continued to have progression of his memory, he is having some problems with directions, the wife is concerned about the driving with safety. The patient also reports some foot pain with weightbearing, he is seeing a foot doctor for this. The patient is driving only short distances. We have put him in contact with our research coordinator, but he decided that he would not want to be involved with any research for memory. The patient returns for further evaluation.  Past Medical History:  Diagnosis Date  . Arthritis   . Asthma   . BPH (benign prostatic hyperplasia)   . Memory deficits 11/01/2014    Past Surgical History:  Procedure Laterality Date  . TONSILLECTOMY      Family History  Problem Relation Age of Onset  . Alzheimer's disease Mother   . Alzheimer's disease Father   . Diabetes Brother   . Alzheimer's disease Sister   . Heart disease Brother   . Diabetes Brother   . Alzheimer's disease Sister   . Alzheimer's disease Brother   . Alzheimer's disease Brother     Social history:  reports that he has quit smoking. He has never used smokeless tobacco. He reports that he does not drink alcohol or use drugs.   No Known Allergies  Medications:  Prior to Admission medications   Medication Sig Start Date End Date Taking? Authorizing Provider  aspirin 81 MG tablet Take 81 mg by mouth daily.   Yes Historical Provider, MD  Cyanocobalamin (VITAMIN B 12 PO) Take by mouth daily.   Yes Historical Provider, MD  donepezil (ARICEPT) 10 MG tablet Take 1 tablet (10 mg total) by mouth at bedtime. 02/27/16  Yes Nilda Riggs, NP  finasteride (PROSCAR) 5 MG tablet Take 5 mg by mouth daily. 09/30/14  Yes Historical Provider, MD  fluticasone (FLONASE) 50 MCG/ACT nasal spray Place 2 sprays into the nose daily. 01/30/13  Yes Reuben Likes, MD  gabapentin (NEURONTIN) 300 MG capsule TAKE 1 CAPSULE IN THE AM AND 2 TO 3 CAPSULES IN THE PM AS TOLERATED 04/14/16  Yes Historical Provider, MD  lisinopril (PRINIVIL,ZESTRIL) 10 MG tablet Take 10 mg by mouth daily. 06/26/16  Yes Historical Provider, MD  meloxicam (MOBIC) 15 MG tablet Take 1 tablet (15 mg total) by mouth daily. 04/27/16  Yes Helane Gunther, DPM  memantine (NAMENDA) 10 MG tablet Take 1 tablet (10 mg total) by mouth 2 (two) times daily. 02/27/16  Yes Nilda Riggs, NP  montelukast (SINGULAIR) 10 MG tablet Take 10 mg by mouth at bedtime.   Yes Historical Provider, MD  Multiple Vitamin (MULTIVITAMIN) tablet Take 1 tablet by mouth daily.   Yes Historical Provider, MD  nitroGLYCERIN (NITROSTAT) 0.4 MG SL tablet Place 0.4 mg under the tongue every 5 (five) minutes as needed for chest pain.   Yes Historical Provider, MD  oxaprozin (DAYPRO) 600 MG tablet Take 1 tablet (600 mg total) by mouth 2 (two) times daily. 06/08/16  Yes Helane Gunther, DPM  sertraline (ZOLOFT) 25 MG tablet Take 25 mg by mouth daily.   Yes Historical Provider, MD  tamsulosin [facility])  0.4 MG CAPS Take 0.4 mg by mouth daily.   Yes Historical Provider, MD    ROS:  Out of a complete 14 system review of symptoms, the patient complains only of the following symptoms, and all other reviewed systems are negative.  Shortness of breath Excessive eating Environmental allergies Difficulty urinating, incontinence the bladder, frequency of urination, urinary urgency, urine decrease Joint swelling Itching Memory loss Anxiety  Blood pressure 139/78, pulse (!) 58, height 5\' 6"  (1.676 m), weight 166 lb (75.3 kg).  Physical Exam  General: The patient is alert and cooperative at the time of the  examination.  Skin: No significant peripheral edema is noted.   Neurologic Exam  Mental status: The patient is alert and oriented x 3 at the time of the examination. The patient has apparent normal recent and remote memory, with an apparently normal attention span and concentration ability. Mini-Mental Status Examination done today shows a total score 24/30.   Cranial nerves: Facial symmetry is present. Speech is normal, no aphasia or dysarthria is noted. Extraocular movements are full. Visual fields are full.  Motor: The patient has good strength in all 4 extremities.  Sensory examination: Soft touch sensation is symmetric on the face, arms, and legs.  Coordination: The patient has good finger-nose-finger and heel-to-shin bilaterally.  Gait and station: The patient has a normal gait. Tandem gait is normal. Romberg is negative. No drift is seen.  Reflexes: Deep tendon reflexes are symmetric.   Assessment/Plan:  1. Progressive memory disturbance  The wife has some concerns about safety with driving, it may be reasonable to discontinue driving at this time. The patient is managing his own medications, I have recommended that he get a pill dispenser to help with this. The wife should be overseeing administration of medication. If the patient wishes to engage in research, I will be happy to set this up. A will follow-up in about 6 months. Prescriptions were sent for the Aricept and Namenda.  Marlan Palau. Keith Yolunda Kloos MD 08/20/2016 12:49 PM  [facility] [address] [place], [place] [id number]  Phone [phone] Fax [phone]

## 2016-08-29 ENCOUNTER — Ambulatory Visit: Payer: Medicare Other | Admitting: Nurse Practitioner

## 2016-09-26 DIAGNOSIS — E785 Hyperlipidemia, unspecified: Secondary | ICD-10-CM | POA: Diagnosis not present

## 2016-09-26 DIAGNOSIS — Z6824 Body mass index (BMI) 24.0-24.9, adult: Secondary | ICD-10-CM | POA: Diagnosis not present

## 2016-09-26 DIAGNOSIS — J45909 Unspecified asthma, uncomplicated: Secondary | ICD-10-CM | POA: Diagnosis not present

## 2016-09-26 DIAGNOSIS — R413 Other amnesia: Secondary | ICD-10-CM | POA: Diagnosis not present

## 2016-10-10 DIAGNOSIS — N401 Enlarged prostate with lower urinary tract symptoms: Secondary | ICD-10-CM | POA: Diagnosis not present

## 2016-10-10 DIAGNOSIS — R350 Frequency of micturition: Secondary | ICD-10-CM | POA: Diagnosis not present

## 2016-11-02 ENCOUNTER — Telehealth: Payer: Self-pay | Admitting: *Deleted

## 2016-11-02 NOTE — Telephone Encounter (Signed)
Pt's wife, Britta MccreedyBarbara states pt sees Dr. Stacie AcresMayer, but the doctor that fills the Gabapentin no longer sees pt. I told wife that she should get a refill from them, then make an appt with Dr. Stacie AcresMayer to discuss gabapentin and check his feet.

## 2016-11-05 ENCOUNTER — Telehealth: Payer: Self-pay | Admitting: Neurology

## 2016-11-05 NOTE — Telephone Encounter (Signed)
Returned call to pt's wife. Let her know that medications containing Benadryl that cause sleepiness can result in decreased reaction times and dizziness. Among older people with other medical problems or physical impairments, this may lead to falls or accidents. Can also contribute to increased confusion or anticholinergic effects. Recommended that she try something like melatonin with fewer risks of side effects.  Mentioned that pt was "hearing things in his ears" and did not sleep well last night. Despite using ear plugs, he kept his wife awake for most of the night. Sooner appt scheduled for next week as requested.

## 2016-11-05 NOTE — Telephone Encounter (Signed)
Patients wife called in reference to having trouble with patient sleeping.  Patient bought Maxium night time strength sleep aid (diphenhydramine HCL 50mg ) OTC at [facility], and wants to know if this is okay for patient to take. Please call

## 2016-11-05 NOTE — Telephone Encounter (Signed)
I called patient, talk with the wife. The patient has been having increasing problems with insomnia at night, I agree that taking Benadryl in a person with memory problems may not be a good idea, I agree that taking melatonin initially may be a good idea, if this does not work, I may try trazodone. The patient will be seen on 11/15/2016.

## 2016-11-10 ENCOUNTER — Emergency Department (HOSPITAL_COMMUNITY)
Admission: EM | Admit: 2016-11-10 | Discharge: 2016-11-10 | Disposition: A | Discharge status: Home / Self Care | Payer: Medicare Other | Attending: Emergency Medicine | Admitting: Emergency Medicine

## 2016-11-10 ENCOUNTER — Encounter (HOSPITAL_COMMUNITY): Payer: Self-pay | Admitting: Emergency Medicine

## 2016-11-10 DIAGNOSIS — Z87891 Personal history of nicotine dependence: Secondary | ICD-10-CM | POA: Insufficient documentation

## 2016-11-10 DIAGNOSIS — Z7982 Long term (current) use of aspirin: Secondary | ICD-10-CM | POA: Insufficient documentation

## 2016-11-10 DIAGNOSIS — R4182 Altered mental status, unspecified: Secondary | ICD-10-CM | POA: Diagnosis present

## 2016-11-10 DIAGNOSIS — J45909 Unspecified asthma, uncomplicated: Secondary | ICD-10-CM | POA: Insufficient documentation

## 2016-11-10 DIAGNOSIS — G4700 Insomnia, unspecified: Secondary | ICD-10-CM | POA: Insufficient documentation

## 2016-11-10 DIAGNOSIS — Z79899 Other long term (current) drug therapy: Secondary | ICD-10-CM | POA: Insufficient documentation

## 2016-11-10 LAB — URINALYSIS, ROUTINE W REFLEX MICROSCOPIC
Bilirubin Urine: NEGATIVE
Glucose, UA: NEGATIVE mg/dL
Ketones, ur: NEGATIVE mg/dL
LEUKOCYTES UA: NEGATIVE
NITRITE: NEGATIVE
PH: 5 (ref 5.0–8.0)
Protein, ur: NEGATIVE mg/dL
SPECIFIC GRAVITY, URINE: 1.029 (ref 1.005–1.030)

## 2016-11-10 NOTE — ED Notes (Signed)
Pt in bathroom for extended period. Unable to obtain vitals or complete triage. Continuing to reassess pt regularly.

## 2016-11-10 NOTE — ED Triage Notes (Signed)
Pt brought by family for steadily increasing confusion, as well as new onset insomnia onset 2 weeks ago. Pt has history of dementia. Being treated with Melatonin for insomnia x 1 week.

## 2016-11-10 NOTE — ED Provider Notes (Signed)
[facility]-EMERGENCY DEPT Provider Note   CSN: [id number] Arrival date & time: 11/10/16  1733     History   Chief Complaint Chief Complaint  Patient presents with  . Altered Mental Status    HPI Andre Ball is a 76 y.o. male.  He is here for evaluation of a behavioral outbursts earlier today when he was yelling at his wife. This resolved after about an hour. The patient acknowledges that he was upset about not being able to sleep, and feels like his wife is not supporting him enough. His wife states that he "has Alzheimer's." He has been seeing his various physicians and not getting answers to his insomnia. He tends to sleep a lot during the day. Sometimes he snores when he sleeps. He has never had a sleep study. There are no other acute illnesses such as cough, dizziness, nausea, vomiting, or suspected fever. Patient is somewhat vague historian. Some of the history comes from the wife, who is here with him. There are no other known modifying factors.  HPI  Past Medical History:  Diagnosis Date  . Arthritis   . Asthma   . BPH (benign prostatic hyperplasia)   . Memory deficits 11/01/2014    Patient Active Problem List   Diagnosis Date Noted  . Memory deficits 11/01/2014    Past Surgical History:  Procedure Laterality Date  . TONSILLECTOMY         Home Medications    Prior to Admission medications   Medication Sig Start Date End Date Taking? Authorizing Provider  Cyanocobalamin (VITAMIN B 12 PO) Take 1 tablet by mouth daily.    Yes Historical Provider, MD  donepezil (ARICEPT) 10 MG tablet Take 1 tablet (10 mg total) by mouth at bedtime. 08/20/16  Yes York Spanielharles K Willis, MD  dutasteride (AVODART) 0.5 MG capsule Take 0.5 mg by mouth daily. 11/02/16  Yes Historical Provider, MD  gabapentin (NEURONTIN) 300 MG capsule TAKE 1 CAPSULE IN THE AM AND 2 TO 3 CAPSULES IN THE PM AS TOLERATED 04/14/16  Yes Historical Provider, MD  lisinopril (PRINIVIL,ZESTRIL) 10 MG tablet Take 10 mg by  mouth daily. 06/26/16  Yes Historical Provider, MD  Melatonin 5 MG TABS Take 5 mg by mouth at bedtime.   Yes Historical Provider, MD  memantine (NAMENDA) 10 MG tablet Take 1 tablet (10 mg total) by mouth 2 (two) times daily. 08/20/16  Yes York Spanielharles K Willis, MD  montelukast (SINGULAIR) 10 MG tablet Take 10 mg by mouth at bedtime.   Yes Historical Provider, MD  nitroGLYCERIN (NITROSTAT) 0.4 MG SL tablet Place 0.4 mg under the tongue every 5 (five) minutes as needed for chest pain.   Yes Historical Provider, MD  sertraline (ZOLOFT) 25 MG tablet Take 25 mg by mouth daily.   Yes Historical Provider, MD  tamsulosin (FLOMAX) 0.4 MG CAPS Take 0.4 mg by mouth daily.   Yes Historical Provider, MD  aspirin 81 MG tablet Take 81 mg by mouth daily.    Historical Provider, MD  fluticasone (FLONASE) 50 MCG/ACT nasal spray Place 2 sprays into the nose daily. Patient not taking: Reported on 11/10/2016 01/30/13   Reuben Likesavid C Keller, MD  oxaprozin (DAYPRO) 600 MG tablet Take 1 tablet (600 mg total) by mouth 2 (two) times daily. Patient not taking: Reported on 11/10/2016 06/08/16   Helane GuntherGregory Mayer, DPM    Family History Family History  Problem Relation Age of Onset  . Alzheimer's disease Mother   . Alzheimer's disease Father   . Diabetes  Brother   . Alzheimer's disease Sister   . Heart disease Brother   . Diabetes Brother   . Alzheimer's disease Sister   . Alzheimer's disease Brother   . Alzheimer's disease Brother     Social History Social History  Substance Use Topics  . Smoking status: Former Games developer  . Smokeless tobacco: Never Used  . Alcohol use No     Comment: occasionally     Allergies   Patient has no known allergies.   Review of Systems Review of Systems  All other systems reviewed and are negative.    Physical Exam Updated Vital Signs BP 147/80 (BP Location: Left Arm)   Pulse (!) 55   Temp 97.8 F (36.6 C) (Oral)   Resp 18   Ht 5\' 11"  (1.803 m)   Wt 175 lb (79.4 kg)   SpO2 100%   BMI  24.41 kg/m   Physical Exam  Constitutional: He is oriented to person, place, and time. He appears well-developed.  Elderly, frail  HENT:  Head: Normocephalic and atraumatic.  Right Ear: External ear normal.  Left Ear: External ear normal.  Eyes: Conjunctivae and EOM are normal. Pupils are equal, round, and reactive to light.  Neck: Normal range of motion and phonation normal. Neck supple.  Cardiovascular: Normal rate, regular rhythm and normal heart sounds.   Pulmonary/Chest: Effort normal and breath sounds normal. He exhibits no bony tenderness.  Abdominal: Soft. There is no tenderness.  Musculoskeletal: Normal range of motion.  Neurological: He is alert and oriented to person, place, and time. No cranial nerve deficit or sensory deficit. He exhibits normal muscle tone. Coordination normal.  Skin: Skin is warm, dry and intact.  Psychiatric: He has a normal mood and affect. His behavior is normal.  Nursing note and vitals reviewed.    ED Treatments / Results  Labs (all labs ordered are listed, but only abnormal results are displayed) Labs Reviewed  URINALYSIS, ROUTINE W REFLEX MICROSCOPIC - Abnormal; Notable for the following:       Result Value   Hgb urine dipstick SMALL (*)    Bacteria, UA RARE (*)    Squamous Epithelial / LPF 0-5 (*)    All other components within normal limits    EKG  EKG Interpretation None       Radiology No results found.  Procedures Procedures (including critical care time)  Medications Ordered in ED Medications - No data to display   Initial Impression / Assessment and Plan / ED Course  I have reviewed the triage vital signs and the nursing notes.  Pertinent labs & imaging results that were available during my care of the patient were reviewed by me and considered in my medical decision making (see chart for details).  Clinical Course     Medications - No data to display  Patient Vitals for the past 24 hrs:  BP Temp Temp src  Pulse Resp SpO2 Height Weight  11/10/16 2001 147/80 - - (!) 55 18 100 % 5\' 11"  (1.803 m) 175 lb (79.4 kg)  11/10/16 1829 162/78 97.8 F (36.6 C) Oral (!) 58 18 100 % - -    8:26 PM Reevaluation with update and discussion. After initial assessment and treatment, an updated evaluation reveals No additional complaints. I discussed the findings with the patient and wife independently. His wife advocates for admitting him for evaluation of insomnia and to give her a break. I urged her to follow-up with her PCP, as well as the patient  with his neurologist for further evaluation and treatment of insomnia and dememtia. Javona Bergevin L    Final Clinical Impressions(s) / ED Diagnoses   Final diagnoses:  Insomnia, unspecified type   Nonspecific sleeplessness, with likely early dementia. No indication for unstable psychiatric condition that would require hospitalization at this time  Nursing Notes Reviewed/ Care Coordinated Applicable Imaging Reviewed Interpretation of Laboratory Data incorporated into ED treatment  The patient appears reasonably screened and/or stabilized for discharge and I doubt any other medical condition or other [facility] requiring further screening, evaluation, or treatment in the ED at this time prior to discharge.  Plan: Home Medications- continue; Home Treatments- Rest; return here if the recommended treatment, does not improve the symptoms; Recommended follow up- PCP prn. Neurology asap    New Prescriptions New Prescriptions   No medications on file     Mancel Bale, MD 11/10/16 2032

## 2016-11-10 NOTE — Discharge Instructions (Signed)
Try increasing your melatonin to see if perhaps you get sleep.  Try to avoid napping during the day to improve your chances for sleeping at night.  Consider asking your doctor to do a sleep apnea test to see if that will help you get better sleep.

## 2016-11-13 ENCOUNTER — Ambulatory Visit (INDEPENDENT_AMBULATORY_CARE_PROVIDER_SITE_OTHER): Payer: Medicare Other | Admitting: Podiatry

## 2016-11-13 ENCOUNTER — Ambulatory Visit: Payer: Medicare Other | Admitting: Podiatry

## 2016-11-13 ENCOUNTER — Encounter: Payer: Self-pay | Admitting: Podiatry

## 2016-11-13 VITALS — Ht 66.0 in | Wt 166.0 lb

## 2016-11-13 DIAGNOSIS — B351 Tinea unguium: Secondary | ICD-10-CM

## 2016-11-13 DIAGNOSIS — M2010 Hallux valgus (acquired), unspecified foot: Secondary | ICD-10-CM

## 2016-11-13 DIAGNOSIS — M79676 Pain in unspecified toe(s): Secondary | ICD-10-CM

## 2016-11-13 DIAGNOSIS — M205X9 Other deformities of toe(s) (acquired), unspecified foot: Secondary | ICD-10-CM

## 2016-11-13 DIAGNOSIS — M2020 Hallux rigidus, unspecified foot: Secondary | ICD-10-CM

## 2016-11-13 MED ORDER — OXAPROZIN 600 MG PO TABS: 600.0000 mg | ORAL_TABLET | Freq: Two times a day (BID) | ORAL | 0 refills | Status: DC

## 2016-11-13 NOTE — Progress Notes (Signed)
This patient returns to the office stating that he has continued pain noted daily and both his feet. His left foot is more painful than his right foot. 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 He was treated initially with injection therapy and then given a prescription of Daypro. He presents the office today with his daughter. She also says that he needs to be evaluated and treated for his long painful nails.    GENERAL APPEARANCE: Alert, conversant. Appropriately groomed. No acute distress.  VASCULAR: Pedal pulses are  palpable at  [facility] and PT bilateral.  Capillary refill time is immediate to all digits,  Normal temperature gradient.  Digital hair growth is present bilateral  NEUROLOGIC: sensation is normal to 5.07 monofilament at 5/5 sites bilateral.  Light touch is intact bilateral, Muscle strength normal.  MUSCULOSKELETAL: acceptable muscle strength, tone and stability bilateral.  Intrinsic muscluature intact bilateral.  HAV  B/L  With hallux limitus.  DERMATOLOGIC: skin color, texture, and turgor are within normal limits.  No preulcerative lesions or ulcers  are seen, no interdigital maceration noted.  No open lesions present.   No drainage noted.  NAILS  Thick disfigured discolored hallux nails.  Hallux limitus 1st MPJ  B/L.  Onychomycosis B/L   ROV  discussed with this patient. His foot problem. His daughter states that she has never received a prescription for Daypro to be taken for his painful feet. This patient is very conversant and congenial, but does have memory problems, I proceeded to discuss his pathology with his daughter and told her he would do better in thick soled shoes. I also told the daughter. I would call in a prescription for Daypro to be taken when the foot is painful. Debridement of his nails was also performed at this visit. This patient was told to return the office in 3 months for continued nail care. Discussed surgical correction of this  deformity and the patient again says he is not interested in proceeding with surgery   Helane GuntherGregory Jalaine Riggenbach DPM.

## 2016-11-15 ENCOUNTER — Ambulatory Visit: Payer: Self-pay | Admitting: Neurology

## 2016-11-27 DIAGNOSIS — I351 Nonrheumatic aortic (valve) insufficiency: Secondary | ICD-10-CM | POA: Diagnosis not present

## 2016-11-27 DIAGNOSIS — G309 Alzheimer's disease, unspecified: Secondary | ICD-10-CM | POA: Diagnosis not present

## 2016-11-27 DIAGNOSIS — I10 Essential (primary) hypertension: Secondary | ICD-10-CM | POA: Diagnosis not present

## 2016-11-27 DIAGNOSIS — I499 Cardiac arrhythmia, unspecified: Secondary | ICD-10-CM | POA: Diagnosis not present

## 2016-12-10 DIAGNOSIS — J45909 Unspecified asthma, uncomplicated: Secondary | ICD-10-CM | POA: Diagnosis not present

## 2016-12-10 DIAGNOSIS — E785 Hyperlipidemia, unspecified: Secondary | ICD-10-CM | POA: Diagnosis not present

## 2016-12-12 ENCOUNTER — Encounter (HOSPITAL_COMMUNITY): Payer: Self-pay

## 2016-12-12 ENCOUNTER — Emergency Department (HOSPITAL_COMMUNITY)
Admission: EM | Admit: 2016-12-12 | Discharge: 2016-12-12 | Disposition: A | Discharge status: Home / Self Care | Payer: Medicare Other | Attending: Emergency Medicine | Admitting: Emergency Medicine

## 2016-12-12 ENCOUNTER — Emergency Department (HOSPITAL_COMMUNITY): Payer: Medicare Other

## 2016-12-12 DIAGNOSIS — M79671 Pain in right foot: Secondary | ICD-10-CM | POA: Insufficient documentation

## 2016-12-12 DIAGNOSIS — Z87891 Personal history of nicotine dependence: Secondary | ICD-10-CM | POA: Insufficient documentation

## 2016-12-12 DIAGNOSIS — Z7982 Long term (current) use of aspirin: Secondary | ICD-10-CM | POA: Insufficient documentation

## 2016-12-12 DIAGNOSIS — F5104 Psychophysiologic insomnia: Secondary | ICD-10-CM | POA: Insufficient documentation

## 2016-12-12 DIAGNOSIS — M79672 Pain in left foot: Secondary | ICD-10-CM | POA: Insufficient documentation

## 2016-12-12 DIAGNOSIS — G309 Alzheimer's disease, unspecified: Secondary | ICD-10-CM | POA: Diagnosis not present

## 2016-12-12 DIAGNOSIS — F0281 Dementia in other diseases classified elsewhere with behavioral disturbance: Secondary | ICD-10-CM | POA: Insufficient documentation

## 2016-12-12 DIAGNOSIS — R002 Palpitations: Secondary | ICD-10-CM | POA: Diagnosis not present

## 2016-12-12 DIAGNOSIS — J45909 Unspecified asthma, uncomplicated: Secondary | ICD-10-CM | POA: Insufficient documentation

## 2016-12-12 DIAGNOSIS — G8929 Other chronic pain: Secondary | ICD-10-CM | POA: Insufficient documentation

## 2016-12-12 DIAGNOSIS — M79673 Pain in unspecified foot: Secondary | ICD-10-CM

## 2016-12-12 DIAGNOSIS — R443 Hallucinations, unspecified: Secondary | ICD-10-CM | POA: Diagnosis not present

## 2016-12-12 DIAGNOSIS — R440 Auditory hallucinations: Secondary | ICD-10-CM | POA: Diagnosis present

## 2016-12-12 LAB — COMPREHENSIVE METABOLIC PANEL
ALK PHOS: 65 U/L (ref 38–126)
ALT: 20 U/L (ref 17–63)
ANION GAP: 8 (ref 5–15)
AST: 29 U/L (ref 15–41)
Albumin: 4 g/dL (ref 3.5–5.0)
BUN: 11 mg/dL (ref 6–20)
CALCIUM: 9 mg/dL (ref 8.9–10.3)
CHLORIDE: 104 mmol/L (ref 101–111)
CO2: 27 mmol/L (ref 22–32)
CREATININE: 1 mg/dL (ref 0.61–1.24)
GFR calc Af Amer: >60 mL/min (ref >60)
GFR calc non Af Amer: >60 mL/min (ref >60)
GLUCOSE: 93 mg/dL (ref 65–99)
Potassium: 3.6 mmol/L (ref 3.5–5.1)
SODIUM: 139 mmol/L (ref 135–145)
Total Bilirubin: 0.6 mg/dL (ref 0.3–1.2)
Total Protein: 6.8 g/dL (ref 6.5–8.1)

## 2016-12-12 LAB — CBC WITH DIFFERENTIAL/PLATELET
BASOS PCT: 0 %
Basophils Absolute: 0 10*3/uL (ref 0.0–0.1)
Eosinophils Absolute: 0.2 10*3/uL (ref 0.0–0.7)
Eosinophils Relative: 5 %
HEMATOCRIT: 37.5 % — AB (ref 39.0–52.0)
HEMOGLOBIN: 12.6 g/dL — AB (ref 13.0–17.0)
LYMPHS ABS: 1.3 10*3/uL (ref 0.7–4.0)
Lymphocytes Relative: 34 %
MCH: 32.4 pg (ref 26.0–34.0)
MCHC: 33.6 g/dL (ref 30.0–36.0)
MCV: 96.4 fL (ref 78.0–100.0)
MONO ABS: 0.2 10*3/uL (ref 0.1–1.0)
MONOS PCT: 6 %
NEUTROS ABS: 2.2 10*3/uL (ref 1.7–7.7)
NEUTROS PCT: 55 %
Platelets: 230 10*3/uL (ref 150–400)
RBC: 3.89 MIL/uL — ABNORMAL LOW (ref 4.22–5.81)
RDW: 12.8 % (ref 11.5–15.5)
WBC: 4 10*3/uL (ref 4.0–10.5)

## 2016-12-12 LAB — URINALYSIS, ROUTINE W REFLEX MICROSCOPIC
BACTERIA UA: NONE SEEN
Bilirubin Urine: NEGATIVE
Glucose, UA: NEGATIVE mg/dL
KETONES UR: NEGATIVE mg/dL
Leukocytes, UA: NEGATIVE
Nitrite: NEGATIVE
PROTEIN: NEGATIVE mg/dL
SQUAMOUS EPITHELIAL / LPF: NONE SEEN
Specific Gravity, Urine: 1.008 (ref 1.005–1.030)
pH: 5 (ref 5.0–8.0)

## 2016-12-12 LAB — TROPONIN I: Troponin I: <0.03 ng/mL (ref <0.03)

## 2016-12-12 MED ORDER — RISPERIDONE 1 MG PO TABS: 1.0000 mg | ORAL_TABLET | Freq: Two times a day (BID) | ORAL | 0 refills | Status: DC

## 2016-12-12 MED ORDER — RISPERIDONE 1 MG PO TABS
1.0000 mg | ORAL_TABLET | Freq: Once | ORAL | Status: AC
  Administered 2016-12-12: 1 mg via ORAL
  Filled 2016-12-12: qty 1

## 2016-12-12 NOTE — ED Notes (Signed)
Patient transported to X-ray 

## 2016-12-12 NOTE — ED Triage Notes (Addendum)
Pt reports he has bilateral foot pain X5-6 months. He also reports he has not been sleeping well. Pt denies diabetes. He is alert and oriented, ambulatory. Pt is poor historian.

## 2016-12-12 NOTE — ED Notes (Signed)
PT reports bilateral foot pain for 1 year, frequent urination for 1 year, and lack of sleep. PT denies visual or auditory hallucinations and "just thought it was time to resolve the foot pain and frequent urination."

## 2016-12-12 NOTE — ED Notes (Signed)
Pts wife informed that patient has been hearing voices and seeing things and needs him to be evaluated by a physician.
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
## 2016-12-12 NOTE — ED Notes (Signed)
PT's son Harriett Sineerrance will be coming to pick PT up. PT made aware. PT's wife is aware that PT is being sent home. PT will remain in room until son arrives.

## 2016-12-13 LAB — RPR: RPR Ser Ql: NONREACTIVE

## 2017-01-16 ENCOUNTER — Emergency Department (HOSPITAL_COMMUNITY): Payer: Medicare Other

## 2017-01-16 ENCOUNTER — Emergency Department (HOSPITAL_COMMUNITY)
Admission: EM | Admit: 2017-01-16 | Discharge: 2017-01-17 | Disposition: A | Discharge status: Home / Self Care | Payer: Medicare Other | Attending: Emergency Medicine | Admitting: Emergency Medicine

## 2017-01-16 DIAGNOSIS — I517 Cardiomegaly: Secondary | ICD-10-CM | POA: Diagnosis not present

## 2017-01-16 DIAGNOSIS — Z7982 Long term (current) use of aspirin: Secondary | ICD-10-CM | POA: Insufficient documentation

## 2017-01-16 DIAGNOSIS — R531 Weakness: Secondary | ICD-10-CM

## 2017-01-16 DIAGNOSIS — R51 Headache: Secondary | ICD-10-CM | POA: Insufficient documentation

## 2017-01-16 DIAGNOSIS — J45909 Unspecified asthma, uncomplicated: Secondary | ICD-10-CM | POA: Insufficient documentation

## 2017-01-16 DIAGNOSIS — M6281 Muscle weakness (generalized): Secondary | ICD-10-CM | POA: Insufficient documentation

## 2017-01-16 DIAGNOSIS — J9811 Atelectasis: Secondary | ICD-10-CM | POA: Diagnosis not present

## 2017-01-16 DIAGNOSIS — Z79899 Other long term (current) drug therapy: Secondary | ICD-10-CM | POA: Insufficient documentation

## 2017-01-16 DIAGNOSIS — R404 Transient alteration of awareness: Secondary | ICD-10-CM | POA: Diagnosis not present

## 2017-01-16 LAB — CBC WITH DIFFERENTIAL/PLATELET
BASOS ABS: 0 10*3/uL (ref 0.0–0.1)
BASOS PCT: 0 %
EOS PCT: 1 %
Eosinophils Absolute: 0 10*3/uL (ref 0.0–0.7)
HEMATOCRIT: 38 % — AB (ref 39.0–52.0)
Hemoglobin: 13 g/dL (ref 13.0–17.0)
Lymphocytes Relative: 11 %
Lymphs Abs: 0.7 10*3/uL (ref 0.7–4.0)
MCH: 32.7 pg (ref 26.0–34.0)
MCHC: 34.2 g/dL (ref 30.0–36.0)
MCV: 95.5 fL (ref 78.0–100.0)
MONO ABS: 0.3 10*3/uL (ref 0.1–1.0)
Monocytes Relative: 4 %
NEUTROS ABS: 5.4 10*3/uL (ref 1.7–7.7)
Neutrophils Relative %: 84 %
PLATELETS: 206 10*3/uL (ref 150–400)
RBC: 3.98 MIL/uL — AB (ref 4.22–5.81)
RDW: 12.9 % (ref 11.5–15.5)
WBC: 6.4 10*3/uL (ref 4.0–10.5)

## 2017-01-16 LAB — COMPREHENSIVE METABOLIC PANEL
ALBUMIN: 3.2 g/dL — AB (ref 3.5–5.0)
ALT: 28 U/L (ref 17–63)
AST: 75 U/L — AB (ref 15–41)
Alkaline Phosphatase: 63 U/L (ref 38–126)
Anion gap: 8 (ref 5–15)
BUN: 10 mg/dL (ref 6–20)
CO2: 29 mmol/L (ref 22–32)
Calcium: 8.6 mg/dL — ABNORMAL LOW (ref 8.9–10.3)
Chloride: 102 mmol/L (ref 101–111)
Creatinine, Ser: 1.02 mg/dL (ref 0.61–1.24)
GFR calc Af Amer: >60 mL/min (ref >60)
GFR calc non Af Amer: >60 mL/min (ref >60)
GLUCOSE: 98 mg/dL (ref 65–99)
POTASSIUM: 5.4 mmol/L — AB (ref 3.5–5.1)
Sodium: 139 mmol/L (ref 135–145)
Total Bilirubin: 1.8 mg/dL — ABNORMAL HIGH (ref 0.3–1.2)
Total Protein: 6.3 g/dL — ABNORMAL LOW (ref 6.5–8.1)

## 2017-01-16 NOTE — ED Notes (Addendum)
Pt current orientation changed to A&O x0. Wife @ bedside sts that pt has bouts of Alzheimer's intermittently throughout the day.

## 2017-01-16 NOTE — ED Provider Notes (Signed)
[facility] Provider Note   CSN: [id number] Arrival date & time: 01/16/17  2122     History   Chief Complaint Chief Complaint  Patient presents with  . Extremity Weakness    HPI Andre Ball is a 76 y.o. male.  HPI  76 year old male who presents with extremity weakness. He has a history of Alzheimer's dementia. History is provided by the wife who states that he did not get out of bed until 8:30 PM yesterday. However seemed to be at his baseline mental status when he went to sleep. This morning has not been able to get out of bed all day due to reported generalized weakness. EMS subsequently called and on their evaluation felt that he had left-sided focal weakness. The family and patient denies that he has had any fevers, increased confusion, cough, shortness of breath, chest pain, abdominal pain, vomiting, diarrhea, or urinary complaints.   Past Medical History:  Diagnosis Date  . Arthritis   . Asthma   . BPH (benign prostatic hyperplasia)   . Memory deficits 11/01/2014    Patient Active Problem List   Diagnosis Date Noted  . Memory deficits 11/01/2014    Past Surgical History:  Procedure Laterality Date  . TONSILLECTOMY         Home Medications    Prior to Admission medications   Medication Sig Start Date End Date Taking? Authorizing Provider  aspirin 81 MG tablet Take 81 mg by mouth daily.   Yes Historical Provider, MD  Cyanocobalamin (VITAMIN B 12 PO) Take 1 tablet by mouth daily.    Yes Historical Provider, MD  diclofenac sodium (VOLTAREN) 1 % GEL Apply 1 application topically as needed (for pain).   Yes Historical Provider, MD  donepezil (ARICEPT) 10 MG tablet Take 1 tablet (10 mg total) by mouth at bedtime. 08/20/16  Yes York Spanielharles K Willis, MD  dutasteride (AVODART) 0.5 MG capsule Take 0.5 mg by mouth daily. 11/02/16  Yes Historical Provider, MD  lisinopril (PRINIVIL,ZESTRIL) 10 MG tablet Take 10 mg by mouth daily. 06/26/16  Yes Historical Provider, MD    memantine (NAMENDA) 10 MG tablet Take 1 tablet (10 mg total) by mouth 2 (two) times daily. 08/20/16  Yes York Spanielharles K Willis, MD  montelukast (SINGULAIR) 10 MG tablet Take 10 mg by mouth at bedtime.   Yes Historical Provider, MD  Multiple Vitamins-Minerals (MULTIVITAMIN PO) Take 1 tablet by mouth daily.   Yes Historical Provider, MD  Omega-3 Fatty Acids (FISH OIL PO) Take 1 capsule by mouth daily.   Yes Historical Provider, MD  risperiDONE (RISPERDAL) 1 MG tablet Take 1 tablet (1 mg total) by mouth 2 (two) times daily. Patient taking differently: Take 1 mg by mouth daily.  12/12/16 01/16/17 Yes Alvira MondayErin Schlossman, MD  sertraline (ZOLOFT) 25 MG tablet Take 25 mg by mouth daily.   Yes Historical Provider, MD  tamsulosin (FLOMAX) 0.4 MG CAPS Take 0.4 mg by mouth daily.   Yes Historical Provider, MD  fluticasone (FLONASE) 50 MCG/ACT nasal spray Place 2 sprays into the nose daily. Patient taking differently: Place 2 sprays into the nose daily as needed for allergies.  01/30/13   Reuben Likesavid C Keller, MD  nitroGLYCERIN (NITROSTAT) 0.4 MG SL tablet Place 0.4 mg under the tongue every 5 (five) minutes as needed for chest pain.    Historical Provider, MD  oxaprozin (DAYPRO) 600 MG tablet Take 1 tablet (600 mg total) by mouth 2 (two) times daily. Patient not taking: Reported on 01/16/2017 11/13/16  Helane Gunther, DPM    Family History Family History  Problem Relation Age of Onset  . Alzheimer's disease Mother   . Alzheimer's disease Father   . Diabetes Brother   . Alzheimer's disease Sister   . Heart disease Brother   . Diabetes Brother   . Alzheimer's disease Sister   . Alzheimer's disease Brother   . Alzheimer's disease Brother     Social History Social History  Substance Use Topics  . Smoking status: Former Games developer  . Smokeless tobacco: Never Used  . Alcohol use No     Comment: occasionally     Allergies   Patient has no known allergies.   Review of Systems Review of Systems 10/14 systems  reviewed and are negative other than those stated in the HPI   Physical Exam Updated Vital Signs BP 135/61   Pulse (!) 51   Temp 98.5 F (36.9 C) (Oral)   Resp 20   SpO2 100%   Physical Exam Physical Exam  Nursing note and vitals reviewed. Constitutional: elderly man,  non-toxic, and in no acute distress Head: Normocephalic and atraumatic.  Mouth/Throat: Oropharynx is clear and moist.  Neck: Normal range of motion. Neck supple.  Cardiovascular: Normal rate and regular rhythm.   Pulmonary/Chest: Effort normal and breath sounds normal.  Abdominal: Soft. There is no tenderness. There is no rebound and no guarding.  Musculoskeletal: Normal range of motion.  Skin: Skin is warm and dry.  Psychiatric: Cooperative Neurological:  Alert, oriented to person, place, time. Fluent speech. No dysarthria or aphasia.  Cranial nerves: VF are full. EOMI without nystagmus. No gaze deviation. Facial muscles symmetric with activation. Sensation to light touch over face in tact bilaterally. Hearing grossly in tact. Palate elevates symmetrically. Head turn and shoulder shrug are intact. Tongue midline.  Reflexes defered.  Muscle bulk and tone normal. No pronator drift. Moves all extremities symmetrically. Sensation to light touch is in tact throughout in bilateral upper and lower extremities. No dysmetria with finger to nose    ED Treatments / Results  Labs (all labs ordered are listed, but only abnormal results are displayed) Labs Reviewed  CBC WITH DIFFERENTIAL/PLATELET - Abnormal; Notable for the following:       Result Value   RBC 3.98 (*)    HCT 38.0 (*)    All other components within normal limits  COMPREHENSIVE METABOLIC PANEL - Abnormal; Notable for the following:    Potassium 5.4 (*)    Calcium 8.6 (*)    Total Protein 6.3 (*)    Albumin 3.2 (*)    AST 75 (*)    Total Bilirubin 1.8 (*)    All other components within normal limits  URINALYSIS, ROUTINE W REFLEX MICROSCOPIC     EKG  EKG Interpretation  Date/Time:  Wednesday January 16 2017 21:26:34 EDT Ventricular Rate:  50 PR Interval:    QRS Duration: 100 QT Interval:  420 QTC Calculation: 383 R Axis:   62 Text Interpretation:  Sinus rhythm Consider left atrial enlargement RSR' in V1 or V2, right VCD or RVH no acute changes  Confirmed by Stepen Prins MD, Tashika Goodin [phone]) on 01/16/2017 10:48:29 PM       Radiology Dg Chest 2 View  Result Date: 01/16/2017 CLINICAL DATA:  Initial evaluation for acute weakness. EXAM: CHEST  2 VIEW COMPARISON:  Prior radiograph from 12/12/2016. FINDINGS: Stable cardiomegaly. Mediastinal silhouette within normal limits. Aortic atherosclerosis noted. Lungs hypoinflated. Linear opacity at the left lung base most compatible with atelectasis. No  other focal infiltrates. No pulmonary edema or pleural effusion. No pneumothorax. No acute osseus abnormality. IMPRESSION: 1. Shallow lung inflation with mild left basilar subsegmental atelectasis. 2. No other active cardiopulmonary disease. 3. Stable cardiomegaly. Electronically Signed   By: Rise Mu M.D.   On: 01/16/2017 22:53   Ct Head Wo Contrast  Result Date: 01/16/2017 CLINICAL DATA:  Left-sided weakness now resolved EXAM: CT HEAD WITHOUT CONTRAST TECHNIQUE: Contiguous axial images were obtained from the base of the skull through the vertex without intravenous contrast. COMPARISON:  12/12/2016, MRI 12/02/2014 FINDINGS: Brain: No acute territorial infarction, hemorrhage or focal mass lesion is visualized. Moderate periventricular white matter small vessel ischemic changes. Mild atrophy. Stable ventricle size. Vascular: No hyperdense vessels. Scattered calcifications at the carotid siphons. Skull: Normal. Negative for fracture or focal lesion. Sinuses/Orbits: Mucosal thickening in the ethmoid and maxillary sinuses. No acute orbital abnormality. Other: None IMPRESSION: 1. No CT evidence for acute intracranial abnormality. MRI follow-up as  clinically indicated 2. Moderate periventricular small vessel ischemic changes, unchanged. Atrophy. Electronically Signed   By: Jasmine Pang M.D.   On: 01/16/2017 23:19    Procedures Procedures (including critical care time)  Medications Ordered in ED Medications - No data to display   Initial Impression / Assessment and Plan / ED Course  I have reviewed the triage vital signs and the nursing notes.  Pertinent labs & imaging results that were available during my care of the patient were reviewed by me and considered in my medical decision making (see chart for details).     76 year old male who presents with generalized weakness. Although EMS noted left-sided weakness, and my evaluation he has no focal weakness or other focal neurological deficits. Does have generalized weakness. Seems to be at his mental status baseline with his dementia. CT head visualized and shows no acute intracranial processes. Basic blood work overall unremarkable. Chest x-ray visualized no showed pneumonia or other acute cardiopulmonary processes of pending urinalysis at this time. If patient patient unable to ambulate despite normal urine, will likely require admission. Urinalysis signed out to oncoming physician, Dr. Patria Mane.  Final Clinical Impressions(s) / ED Diagnoses   Final diagnoses:  Generalized weakness    New Prescriptions New Prescriptions   No medications on file     Lavera Guise, MD 01/17/17 [phone]

## 2017-01-16 NOTE — ED Triage Notes (Signed)
Pt from home via [facility]. Per EMS, pt wife stated pt had x2 episodes of incontinence and was unable to get out bed this morning due to increased L sided weakness. Pt c/o pain on L side w/ movement. A&O x4

## 2017-01-17 DIAGNOSIS — M6281 Muscle weakness (generalized): Secondary | ICD-10-CM | POA: Diagnosis not present

## 2017-01-17 LAB — URINALYSIS, ROUTINE W REFLEX MICROSCOPIC
BACTERIA UA: NONE SEEN
BILIRUBIN URINE: NEGATIVE
Glucose, UA: NEGATIVE mg/dL
Ketones, ur: 20 mg/dL — AB
LEUKOCYTES UA: NEGATIVE
Nitrite: NEGATIVE
PROTEIN: NEGATIVE mg/dL
SPECIFIC GRAVITY, URINE: 1.024 (ref 1.005–1.030)
pH: 5 (ref 5.0–8.0)

## 2017-01-17 NOTE — ED Provider Notes (Signed)
Workup in ER without abnormality. Dc home with wife. Home health resources including SW for placement. No indication for additional testing or admission to the hospital   Azalia BilisKevin Taras Rask, MD 01/17/17 [phone]
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
## 2017-01-17 NOTE — ED Notes (Addendum)
Pt getting increasingly agitated and refusing to allow for in and out cath. Stating he does not know why he's here and he doesn't want anybody to touch him. Wife @ bedside stating this is how "he always gets like that." He has removed his pulse oximeter off his finger and refusing to put it back on.

## 2017-01-17 NOTE — ED Notes (Signed)
EDP currently @ bedside.

## 2017-01-23 DIAGNOSIS — J45909 Unspecified asthma, uncomplicated: Secondary | ICD-10-CM | POA: Diagnosis not present

## 2017-01-23 DIAGNOSIS — R261 Paralytic gait: Secondary | ICD-10-CM | POA: Diagnosis not present

## 2017-01-23 DIAGNOSIS — G309 Alzheimer's disease, unspecified: Secondary | ICD-10-CM | POA: Diagnosis not present

## 2017-01-23 DIAGNOSIS — F0280 Dementia in other diseases classified elsewhere without behavioral disturbance: Secondary | ICD-10-CM | POA: Diagnosis not present

## 2017-01-23 DIAGNOSIS — I10 Essential (primary) hypertension: Secondary | ICD-10-CM | POA: Diagnosis not present

## 2017-01-23 DIAGNOSIS — L89612 Pressure ulcer of right heel, stage 2: Secondary | ICD-10-CM | POA: Diagnosis not present

## 2017-01-24 DIAGNOSIS — R261 Paralytic gait: Secondary | ICD-10-CM | POA: Diagnosis not present

## 2017-01-24 DIAGNOSIS — L89612 Pressure ulcer of right heel, stage 2: Secondary | ICD-10-CM | POA: Diagnosis not present

## 2017-01-24 DIAGNOSIS — G309 Alzheimer's disease, unspecified: Secondary | ICD-10-CM | POA: Diagnosis not present

## 2017-01-24 DIAGNOSIS — I10 Essential (primary) hypertension: Secondary | ICD-10-CM | POA: Diagnosis not present

## 2017-01-24 DIAGNOSIS — J45909 Unspecified asthma, uncomplicated: Secondary | ICD-10-CM | POA: Diagnosis not present

## 2017-01-24 DIAGNOSIS — F0280 Dementia in other diseases classified elsewhere without behavioral disturbance: Secondary | ICD-10-CM | POA: Diagnosis not present

## 2017-01-25 DIAGNOSIS — G309 Alzheimer's disease, unspecified: Secondary | ICD-10-CM | POA: Diagnosis not present

## 2017-01-25 DIAGNOSIS — J45909 Unspecified asthma, uncomplicated: Secondary | ICD-10-CM | POA: Diagnosis not present

## 2017-01-25 DIAGNOSIS — F0280 Dementia in other diseases classified elsewhere without behavioral disturbance: Secondary | ICD-10-CM | POA: Diagnosis not present

## 2017-01-25 DIAGNOSIS — I10 Essential (primary) hypertension: Secondary | ICD-10-CM | POA: Diagnosis not present

## 2017-01-25 DIAGNOSIS — L89612 Pressure ulcer of right heel, stage 2: Secondary | ICD-10-CM | POA: Diagnosis not present

## 2017-01-25 DIAGNOSIS — R261 Paralytic gait: Secondary | ICD-10-CM | POA: Diagnosis not present

## 2017-01-26 DIAGNOSIS — L89612 Pressure ulcer of right heel, stage 2: Secondary | ICD-10-CM | POA: Diagnosis not present

## 2017-01-26 DIAGNOSIS — R261 Paralytic gait: Secondary | ICD-10-CM | POA: Diagnosis not present

## 2017-01-26 DIAGNOSIS — G309 Alzheimer's disease, unspecified: Secondary | ICD-10-CM | POA: Diagnosis not present

## 2017-01-26 DIAGNOSIS — I10 Essential (primary) hypertension: Secondary | ICD-10-CM | POA: Diagnosis not present

## 2017-01-26 DIAGNOSIS — J45909 Unspecified asthma, uncomplicated: Secondary | ICD-10-CM | POA: Diagnosis not present

## 2017-01-26 DIAGNOSIS — F0280 Dementia in other diseases classified elsewhere without behavioral disturbance: Secondary | ICD-10-CM | POA: Diagnosis not present

## 2017-01-29 DIAGNOSIS — F0280 Dementia in other diseases classified elsewhere without behavioral disturbance: Secondary | ICD-10-CM | POA: Diagnosis not present

## 2017-01-29 DIAGNOSIS — L89612 Pressure ulcer of right heel, stage 2: Secondary | ICD-10-CM | POA: Diagnosis not present

## 2017-01-29 DIAGNOSIS — R261 Paralytic gait: Secondary | ICD-10-CM | POA: Diagnosis not present

## 2017-01-29 DIAGNOSIS — I10 Essential (primary) hypertension: Secondary | ICD-10-CM | POA: Diagnosis not present

## 2017-01-29 DIAGNOSIS — G309 Alzheimer's disease, unspecified: Secondary | ICD-10-CM | POA: Diagnosis not present

## 2017-01-29 DIAGNOSIS — J45909 Unspecified asthma, uncomplicated: Secondary | ICD-10-CM | POA: Diagnosis not present

## 2017-01-30 DIAGNOSIS — J45909 Unspecified asthma, uncomplicated: Secondary | ICD-10-CM | POA: Diagnosis not present

## 2017-01-30 DIAGNOSIS — G309 Alzheimer's disease, unspecified: Secondary | ICD-10-CM | POA: Diagnosis not present

## 2017-01-30 DIAGNOSIS — F0280 Dementia in other diseases classified elsewhere without behavioral disturbance: Secondary | ICD-10-CM | POA: Diagnosis not present

## 2017-01-30 DIAGNOSIS — I10 Essential (primary) hypertension: Secondary | ICD-10-CM | POA: Diagnosis not present

## 2017-01-30 DIAGNOSIS — L89612 Pressure ulcer of right heel, stage 2: Secondary | ICD-10-CM | POA: Diagnosis not present

## 2017-01-30 DIAGNOSIS — R261 Paralytic gait: Secondary | ICD-10-CM | POA: Diagnosis not present

## 2017-01-31 ENCOUNTER — Observation Stay (HOSPITAL_COMMUNITY): Payer: Medicare Other

## 2017-01-31 ENCOUNTER — Encounter (HOSPITAL_COMMUNITY): Payer: Self-pay | Admitting: Obstetrics and Gynecology

## 2017-01-31 ENCOUNTER — Emergency Department (HOSPITAL_COMMUNITY): Payer: Medicare Other

## 2017-01-31 ENCOUNTER — Inpatient Hospital Stay (HOSPITAL_COMMUNITY)
Admission: EM | Admit: 2017-01-31 | Discharge: 2017-02-04 | DRG: 564 | Disposition: A | Discharge status: Skilled Nursing Facility | Payer: Medicare Other | Attending: Family Medicine | Admitting: Family Medicine

## 2017-01-31 DIAGNOSIS — L8990 Pressure ulcer of unspecified site, unspecified stage: Secondary | ICD-10-CM | POA: Insufficient documentation

## 2017-01-31 DIAGNOSIS — L89153 Pressure ulcer of sacral region, stage 3: Secondary | ICD-10-CM | POA: Diagnosis not present

## 2017-01-31 DIAGNOSIS — E860 Dehydration: Secondary | ICD-10-CM | POA: Diagnosis present

## 2017-01-31 DIAGNOSIS — J45909 Unspecified asthma, uncomplicated: Secondary | ICD-10-CM | POA: Diagnosis present

## 2017-01-31 DIAGNOSIS — R296 Repeated falls: Secondary | ICD-10-CM | POA: Diagnosis not present

## 2017-01-31 DIAGNOSIS — W1830XA Fall on same level, unspecified, initial encounter: Secondary | ICD-10-CM | POA: Diagnosis present

## 2017-01-31 DIAGNOSIS — R945 Abnormal results of liver function studies: Secondary | ICD-10-CM

## 2017-01-31 DIAGNOSIS — S0990XA Unspecified injury of head, initial encounter: Secondary | ICD-10-CM | POA: Diagnosis not present

## 2017-01-31 DIAGNOSIS — G629 Polyneuropathy, unspecified: Secondary | ICD-10-CM | POA: Diagnosis present

## 2017-01-31 DIAGNOSIS — Z820 Family history of epilepsy and other diseases of the nervous system: Secondary | ICD-10-CM

## 2017-01-31 DIAGNOSIS — R4182 Altered mental status, unspecified: Secondary | ICD-10-CM | POA: Diagnosis not present

## 2017-01-31 DIAGNOSIS — I10 Essential (primary) hypertension: Secondary | ICD-10-CM | POA: Diagnosis not present

## 2017-01-31 DIAGNOSIS — M6282 Rhabdomyolysis: Secondary | ICD-10-CM | POA: Diagnosis present

## 2017-01-31 DIAGNOSIS — E870 Hyperosmolality and hypernatremia: Secondary | ICD-10-CM | POA: Diagnosis not present

## 2017-01-31 DIAGNOSIS — S199XXA Unspecified injury of neck, initial encounter: Secondary | ICD-10-CM | POA: Diagnosis not present

## 2017-01-31 DIAGNOSIS — S3993XA Unspecified injury of pelvis, initial encounter: Secondary | ICD-10-CM | POA: Diagnosis not present

## 2017-01-31 DIAGNOSIS — N289 Disorder of kidney and ureter, unspecified: Secondary | ICD-10-CM

## 2017-01-31 DIAGNOSIS — R261 Paralytic gait: Secondary | ICD-10-CM | POA: Diagnosis not present

## 2017-01-31 DIAGNOSIS — E440 Moderate protein-calorie malnutrition: Secondary | ICD-10-CM | POA: Diagnosis not present

## 2017-01-31 DIAGNOSIS — Z6820 Body mass index (BMI) 20.0-20.9, adult: Secondary | ICD-10-CM

## 2017-01-31 DIAGNOSIS — D649 Anemia, unspecified: Secondary | ICD-10-CM | POA: Diagnosis present

## 2017-01-31 DIAGNOSIS — N400 Enlarged prostate without lower urinary tract symptoms: Secondary | ICD-10-CM | POA: Diagnosis present

## 2017-01-31 DIAGNOSIS — W19XXXA Unspecified fall, initial encounter: Secondary | ICD-10-CM

## 2017-01-31 DIAGNOSIS — Z8249 Family history of ischemic heart disease and other diseases of the circulatory system: Secondary | ICD-10-CM

## 2017-01-31 DIAGNOSIS — R531 Weakness: Secondary | ICD-10-CM | POA: Diagnosis not present

## 2017-01-31 DIAGNOSIS — T796XXA Traumatic ischemia of muscle, initial encounter: Principal | ICD-10-CM | POA: Diagnosis present

## 2017-01-31 DIAGNOSIS — L89892 Pressure ulcer of other site, stage 2: Secondary | ICD-10-CM | POA: Diagnosis present

## 2017-01-31 DIAGNOSIS — Z833 Family history of diabetes mellitus: Secondary | ICD-10-CM

## 2017-01-31 DIAGNOSIS — S91311A Laceration without foreign body, right foot, initial encounter: Secondary | ICD-10-CM | POA: Diagnosis present

## 2017-01-31 DIAGNOSIS — R7989 Other specified abnormal findings of blood chemistry: Secondary | ICD-10-CM

## 2017-01-31 DIAGNOSIS — R404 Transient alteration of awareness: Secondary | ICD-10-CM | POA: Diagnosis not present

## 2017-01-31 DIAGNOSIS — F0390 Unspecified dementia without behavioral disturbance: Secondary | ICD-10-CM | POA: Diagnosis present

## 2017-01-31 DIAGNOSIS — M791 Myalgia: Secondary | ICD-10-CM | POA: Diagnosis not present

## 2017-01-31 DIAGNOSIS — Z87891 Personal history of nicotine dependence: Secondary | ICD-10-CM

## 2017-01-31 DIAGNOSIS — F0280 Dementia in other diseases classified elsewhere without behavioral disturbance: Secondary | ICD-10-CM | POA: Diagnosis not present

## 2017-01-31 DIAGNOSIS — G309 Alzheimer's disease, unspecified: Secondary | ICD-10-CM | POA: Diagnosis not present

## 2017-01-31 DIAGNOSIS — Z66 Do not resuscitate: Secondary | ICD-10-CM | POA: Diagnosis present

## 2017-01-31 DIAGNOSIS — Z79899 Other long term (current) drug therapy: Secondary | ICD-10-CM

## 2017-01-31 DIAGNOSIS — L89612 Pressure ulcer of right heel, stage 2: Secondary | ICD-10-CM | POA: Diagnosis not present

## 2017-01-31 LAB — COMPREHENSIVE METABOLIC PANEL
ALT: 74 U/L — ABNORMAL HIGH (ref 17–63)
ANION GAP: 5 (ref 5–15)
AST: 145 U/L — ABNORMAL HIGH (ref 15–41)
Albumin: 2.9 g/dL — ABNORMAL LOW (ref 3.5–5.0)
Alkaline Phosphatase: 56 U/L (ref 38–126)
BUN: 33 mg/dL — ABNORMAL HIGH (ref 6–20)
CALCIUM: 8.6 mg/dL — AB (ref 8.9–10.3)
CHLORIDE: 112 mmol/L — AB (ref 101–111)
CO2: 32 mmol/L (ref 22–32)
Creatinine, Ser: 0.99 mg/dL (ref 0.61–1.24)
GFR calc non Af Amer: >60 mL/min (ref >60)
Glucose, Bld: 103 mg/dL — ABNORMAL HIGH (ref 65–99)
Potassium: 3.6 mmol/L (ref 3.5–5.1)
SODIUM: 149 mmol/L — AB (ref 135–145)
Total Bilirubin: 0.8 mg/dL (ref 0.3–1.2)
Total Protein: 6.5 g/dL (ref 6.5–8.1)

## 2017-01-31 LAB — CBC WITH DIFFERENTIAL/PLATELET
BASOS PCT: 0 %
Basophils Absolute: 0 10*3/uL (ref 0.0–0.1)
Eosinophils Absolute: 0.2 10*3/uL (ref 0.0–0.7)
Eosinophils Relative: 2 %
HEMATOCRIT: 36.7 % — AB (ref 39.0–52.0)
HEMOGLOBIN: 11.9 g/dL — AB (ref 13.0–17.0)
LYMPHS ABS: 0.9 10*3/uL (ref 0.7–4.0)
Lymphocytes Relative: 11 %
MCH: 32.2 pg (ref 26.0–34.0)
MCHC: 32.4 g/dL (ref 30.0–36.0)
MCV: 99.2 fL (ref 78.0–100.0)
MONOS PCT: 5 %
Monocytes Absolute: 0.4 10*3/uL (ref 0.1–1.0)
NEUTROS ABS: 7 10*3/uL (ref 1.7–7.7)
NEUTROS PCT: 82 %
Platelets: 257 10*3/uL (ref 150–400)
RBC: 3.7 MIL/uL — AB (ref 4.22–5.81)
RDW: 13.6 % (ref 11.5–15.5)
WBC: 8.5 10*3/uL (ref 4.0–10.5)

## 2017-01-31 LAB — I-STAT TROPONIN, ED: Troponin i, poc: 0.03 ng/mL (ref 0.00–0.08)

## 2017-01-31 LAB — I-STAT CG4 LACTIC ACID, ED: Lactic Acid, Venous: 1.23 mmol/L (ref 0.5–1.9)

## 2017-01-31 MED ORDER — DEXTROSE 5 % IV SOLN
INTRAVENOUS | Status: AC
  Administered 2017-01-31 – 2017-02-01 (×3): via INTRAVENOUS
  Filled 2017-01-31 (×3): qty 1000

## 2017-01-31 MED ORDER — SODIUM CHLORIDE 0.9 % IV SOLN: Freq: Once | INTRAVENOUS | Status: DC

## 2017-01-31 NOTE — ED Notes (Signed)
Bed: [facility] Expected date:  Expected time:  Means of arrival:  Comments: EMS-fall 

## 2017-01-31 NOTE — ED Notes (Signed)
Bed: [facility]24 Expected date:  Expected time:  Means of arrival:  Comments: [facility] 
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
## 2017-01-31 NOTE — ED Notes (Signed)
Pt has thin layer of white on tongue. Pt has a stage 2 wound on the crease of the glutueal folds. He has a bruise on the rt lower calf. He has a wound on the rt heal. Pt is incontinent. Pt has an abrasion on left hip.

## 2017-01-31 NOTE — ED Notes (Addendum)
Triage entry of fall with laceration documentation, isolation, and primary assessment is in error. Wrong patient

## 2017-01-31 NOTE — ED Notes (Signed)
Pt made aware of need for urine sample. Urinal given.

## 2017-01-31 NOTE — ED Notes (Signed)
Condom catheter applied to patient to obtain urine sample.

## 2017-01-31 NOTE — ED Provider Notes (Signed)
[facility]-EMERGENCY DEPT Provider Note   CSN: [id number] Arrival date & time: 01/31/17  1814     History   Chief Complaint Chief Complaint  Patient presents with  . Fall  . Facial Injury  . Extremity Laceration    HPI Andre Ball is a 76 y.o. male.  HPI Patient's son is main historian today. He reports that his father's had increasing downwards spiral which is become much more pronounced over the past couple weeks to days. He reports that he cannot get out of bed at all. He reports that they're having to hand feed him now. Patient son reports that somehow during the night his father however managed to get out of bed and was holding onto the wall in the hallway where his wife found him. He was unsteady and fell and had to be assisted back into bed. Patient's son reports that he is requiring full care for all ADLs and is exceeding the patient's wife's physical capacity. Patient son reports that he has abrasion/sore to his right foot. Patient reports his shoulders hurt as well as his hips. They have not documented fever. Patient's appetite has gone down dramatically although he continues to eat small amounts. Past Medical History:  Diagnosis Date  . Arthritis   . Asthma   . BPH (benign prostatic hyperplasia)   . Memory deficits 11/01/2014    Patient Active Problem List   Diagnosis Date Noted  . Memory deficits 11/01/2014    Past Surgical History:  Procedure Laterality Date  . TONSILLECTOMY         Home Medications    Prior to Admission medications   Medication Sig Start Date End Date Taking? Authorizing Provider  aspirin 81 MG tablet Take 81 mg by mouth daily.   Yes Historical Provider, MD  Cyanocobalamin (VITAMIN B 12 PO) Take 1 tablet by mouth daily.    Yes Historical Provider, MD  diclofenac sodium (VOLTAREN) 1 % GEL Apply 1 application topically as needed (for pain).   Yes Historical Provider, MD  donepezil (ARICEPT) 10 MG tablet Take 1 tablet (10 mg total) by mouth at  bedtime. 08/20/16  Yes York Spaniel, MD  dutasteride (AVODART) 0.5 MG capsule Take 0.5 mg by mouth daily. 11/02/16  Yes Historical Provider, MD  fluticasone (FLONASE) 50 MCG/ACT nasal spray Place 2 sprays into the nose daily. Patient taking differently: Place 2 sprays into the nose daily as needed for allergies.  01/30/13  Yes Reuben Likes, MD  lisinopril (PRINIVIL,ZESTRIL) 10 MG tablet Take 10 mg by mouth daily. 06/26/16  Yes Historical Provider, MD  memantine (NAMENDA) 10 MG tablet Take 1 tablet (10 mg total) by mouth 2 (two) times daily. 08/20/16  Yes York Spaniel, MD  montelukast (SINGULAIR) 10 MG tablet Take 10 mg by mouth at bedtime.   Yes Historical Provider, MD  Multiple Vitamins-Minerals (MULTIVITAMIN PO) Take 1 tablet by mouth daily.   Yes Historical Provider, MD  Omega-3 Fatty Acids (FISH OIL PO) Take 1 capsule by mouth daily.   Yes Historical Provider, MD  risperiDONE (RISPERDAL) 1 MG tablet Take 1 tablet (1 mg total) by mouth 2 (two) times daily. Patient taking differently: Take 1 mg by mouth daily.  12/12/16 01/31/17 Yes Alvira Monday, MD  sertraline (ZOLOFT) 25 MG tablet Take 25 mg by mouth daily.   Yes Historical Provider, MD  tamsulosin (FLOMAX) 0.4 MG CAPS Take 0.4 mg by mouth daily.   Yes Historical Provider, MD  nitroGLYCERIN (NITROSTAT) 0.4 MG SL  tablet Place 0.4 mg under the tongue every 5 (five) minutes as needed for chest pain.    Historical Provider, MD  oxaprozin (DAYPRO) 600 MG tablet Take 1 tablet (600 mg total) by mouth 2 (two) times daily. Patient not taking: Reported on 01/16/2017 11/13/16   Helane Gunther, DPM    Family History Family History  Problem Relation Age of Onset  . Alzheimer's disease Mother   . Alzheimer's disease Father   . Diabetes Brother   . Alzheimer's disease Sister   . Heart disease Brother   . Diabetes Brother   . Alzheimer's disease Sister   . Alzheimer's disease Brother   . Alzheimer's disease Brother     Social History Social  History  Substance Use Topics  . Smoking status: Former Games developer  . Smokeless tobacco: Never Used  . Alcohol use No     Comment: occasionally     Allergies   Patient has no known allergies.   Review of Systems Review of Systems 10 Systems reviewed and are negative for acute change except as noted in the HPI.   Physical Exam Updated Vital Signs BP (!) 118/57 (BP Location: Left Arm)   Pulse 82   Temp 97.1 F (36.2 C) (Oral)   Resp 12   SpO2 97%   Physical Exam  Constitutional:  Patient is very thin. He is alert without respiratory distress.  HENT:  Head: Normocephalic and atraumatic.  Nose: Nose normal.  Oropharynx clear. Mucous membranes slightly dry.  Eyes: EOM are normal. Pupils are equal, round, and reactive to light.  Neck: Neck supple.  Cardiovascular: Normal rate, regular rhythm, normal heart sounds and intact distal pulses.   Pulmonary/Chest: Effort normal and breath sounds normal. No respiratory distress.  Abdominal: Soft. He exhibits no distension. There is no tenderness. There is no guarding.  Musculoskeletal:  No deformities of upper extremity. Patient reports however significant pain with range of motion at his shoulders. Distal pulses 2+. Lower extremities no significant edema. No cellulitis. Skin condition good except medial right foot has a 3 cm moist eschar.  Neurological: He is alert.  Patient is alert but has difficulty assisting him to move in the stretcher perform neurologic examination. He does respond with appropriate answers and speech that is not slurred. However, due to generalized weakness and generalized pain, he has limited strength for motor exam. No focal weakness or deficit.  Skin: Skin is warm and dry.  Psychiatric:  Affect is flat.     ED Treatments / Results  Labs (all labs ordered are listed, but only abnormal results are displayed) Labs Reviewed  COMPREHENSIVE METABOLIC PANEL - Abnormal; Notable for the following:       Result  Value   Sodium 149 (*)    Chloride 112 (*)    Glucose, Bld 103 (*)    BUN 33 (*)    Calcium 8.6 (*)    Albumin 2.9 (*)    AST 145 (*)    ALT 74 (*)    All other components within normal limits  CBC WITH DIFFERENTIAL/PLATELET - Abnormal; Notable for the following:    RBC 3.70 (*)    Hemoglobin 11.9 (*)    HCT 36.7 (*)    All other components within normal limits  URINALYSIS, ROUTINE W REFLEX MICROSCOPIC  I-STAT CG4 LACTIC ACID, ED  Rosezena Sensor, ED    EKG  EKG Interpretation  Date/Time:  Thursday January 31 2017 19:56:01 EDT Ventricular Rate:  63 PR Interval:  QRS Duration: 100 QT Interval:  472 QTC Calculation: 484 R Axis:   56 Text Interpretation:  Sinus rhythm Multiple ventricular premature complexes RSR' in V1 or V2, right VCD or RVH Borderline prolonged QT interval no acute ischemia. no change from previous Confirmed by Donnald Garre, MD, Lebron Conners [phone]) on 01/31/2017 8:29:29 PM       Radiology Dg Chest Port 1 View  Result Date: 01/31/2017 CLINICAL DATA:  Possible fall last evening.  Altered mental status. EXAM: PORTABLE CHEST 1 VIEW COMPARISON:  01/16/2017 CXR FINDINGS: The heart size and mediastinal contours are within normal limits. Atherosclerosis at the aortic arch without aneurysm. Both lungs are clear. The visualized skeletal structures are unremarkable. IMPRESSION: No active disease. Electronically Signed   By: Tollie Eth M.D.   On: 01/31/2017 20:41    Procedures Procedures (including critical care time)  Medications Ordered in ED Medications  0.9 %  sodium chloride infusion (not administered)     Initial Impression / Assessment and Plan / ED Course  I have reviewed the triage vital signs and the nursing notes.  Pertinent labs & imaging results that were available during my care of the patient were reviewed by me and considered in my medical decision making (see chart for details).      Final Clinical Impressions(s) / ED Diagnoses   Final diagnoses:    Renal insufficiency  Hypernatremia  General weakness  Patient has dementia with increasing failure of ADLs weakness. Exacerbating today's presentation appears to be renal insufficiency and hypernatremia. Patient be admitted for treatment of these conditions and evaluation for increased in home care needs or nursing home placement.  New Prescriptions New Prescriptions   No medications on file     Arby Barrette, MD 02/06/17 1727

## 2017-02-01 ENCOUNTER — Encounter (HOSPITAL_COMMUNITY): Payer: Self-pay

## 2017-02-01 ENCOUNTER — Observation Stay (HOSPITAL_COMMUNITY): Payer: Medicare Other

## 2017-02-01 DIAGNOSIS — Z820 Family history of epilepsy and other diseases of the nervous system: Secondary | ICD-10-CM | POA: Diagnosis not present

## 2017-02-01 DIAGNOSIS — Z66 Do not resuscitate: Secondary | ICD-10-CM | POA: Diagnosis present

## 2017-02-01 DIAGNOSIS — I10 Essential (primary) hypertension: Secondary | ICD-10-CM | POA: Diagnosis not present

## 2017-02-01 DIAGNOSIS — M79673 Pain in unspecified foot: Secondary | ICD-10-CM | POA: Diagnosis not present

## 2017-02-01 DIAGNOSIS — Z833 Family history of diabetes mellitus: Secondary | ICD-10-CM | POA: Diagnosis not present

## 2017-02-01 DIAGNOSIS — N289 Disorder of kidney and ureter, unspecified: Secondary | ICD-10-CM | POA: Diagnosis not present

## 2017-02-01 DIAGNOSIS — Z87891 Personal history of nicotine dependence: Secondary | ICD-10-CM | POA: Diagnosis not present

## 2017-02-01 DIAGNOSIS — N400 Enlarged prostate without lower urinary tract symptoms: Secondary | ICD-10-CM | POA: Diagnosis present

## 2017-02-01 DIAGNOSIS — F0390 Unspecified dementia without behavioral disturbance: Secondary | ICD-10-CM | POA: Diagnosis not present

## 2017-02-01 DIAGNOSIS — J45909 Unspecified asthma, uncomplicated: Secondary | ICD-10-CM | POA: Diagnosis present

## 2017-02-01 DIAGNOSIS — L89892 Pressure ulcer of other site, stage 2: Secondary | ICD-10-CM | POA: Diagnosis not present

## 2017-02-01 DIAGNOSIS — S91311A Laceration without foreign body, right foot, initial encounter: Secondary | ICD-10-CM | POA: Diagnosis present

## 2017-02-01 DIAGNOSIS — L89224 Pressure ulcer of left hip, stage 4: Secondary | ICD-10-CM | POA: Diagnosis not present

## 2017-02-01 DIAGNOSIS — E860 Dehydration: Secondary | ICD-10-CM

## 2017-02-01 DIAGNOSIS — Z6820 Body mass index (BMI) 20.0-20.9, adult: Secondary | ICD-10-CM | POA: Diagnosis not present

## 2017-02-01 DIAGNOSIS — T796XXD Traumatic ischemia of muscle, subsequent encounter: Secondary | ICD-10-CM | POA: Diagnosis not present

## 2017-02-01 DIAGNOSIS — R945 Abnormal results of liver function studies: Secondary | ICD-10-CM | POA: Diagnosis not present

## 2017-02-01 DIAGNOSIS — L89893 Pressure ulcer of other site, stage 3: Secondary | ICD-10-CM | POA: Diagnosis not present

## 2017-02-01 DIAGNOSIS — D649 Anemia, unspecified: Secondary | ICD-10-CM | POA: Diagnosis present

## 2017-02-01 DIAGNOSIS — F339 Major depressive disorder, recurrent, unspecified: Secondary | ICD-10-CM | POA: Diagnosis not present

## 2017-02-01 DIAGNOSIS — L8990 Pressure ulcer of unspecified site, unspecified stage: Secondary | ICD-10-CM | POA: Insufficient documentation

## 2017-02-01 DIAGNOSIS — E870 Hyperosmolality and hypernatremia: Secondary | ICD-10-CM

## 2017-02-01 DIAGNOSIS — E440 Moderate protein-calorie malnutrition: Secondary | ICD-10-CM | POA: Insufficient documentation

## 2017-02-01 DIAGNOSIS — T796XXA Traumatic ischemia of muscle, initial encounter: Secondary | ICD-10-CM | POA: Diagnosis present

## 2017-02-01 DIAGNOSIS — M6282 Rhabdomyolysis: Secondary | ICD-10-CM | POA: Diagnosis present

## 2017-02-01 DIAGNOSIS — Z8249 Family history of ischemic heart disease and other diseases of the circulatory system: Secondary | ICD-10-CM | POA: Diagnosis not present

## 2017-02-01 DIAGNOSIS — M25519 Pain in unspecified shoulder: Secondary | ICD-10-CM | POA: Diagnosis not present

## 2017-02-01 DIAGNOSIS — R296 Repeated falls: Secondary | ICD-10-CM | POA: Diagnosis present

## 2017-02-01 DIAGNOSIS — Z9181 History of falling: Secondary | ICD-10-CM | POA: Diagnosis not present

## 2017-02-01 DIAGNOSIS — W1830XA Fall on same level, unspecified, initial encounter: Secondary | ICD-10-CM | POA: Diagnosis present

## 2017-02-01 DIAGNOSIS — L89154 Pressure ulcer of sacral region, stage 4: Secondary | ICD-10-CM | POA: Diagnosis not present

## 2017-02-01 DIAGNOSIS — R531 Weakness: Secondary | ICD-10-CM | POA: Diagnosis not present

## 2017-02-01 DIAGNOSIS — G629 Polyneuropathy, unspecified: Secondary | ICD-10-CM | POA: Diagnosis present

## 2017-02-01 DIAGNOSIS — Z79899 Other long term (current) drug therapy: Secondary | ICD-10-CM | POA: Diagnosis not present

## 2017-02-01 DIAGNOSIS — M6281 Muscle weakness (generalized): Secondary | ICD-10-CM | POA: Diagnosis not present

## 2017-02-01 DIAGNOSIS — R4182 Altered mental status, unspecified: Secondary | ICD-10-CM | POA: Diagnosis not present

## 2017-02-01 DIAGNOSIS — W19XXXD Unspecified fall, subsequent encounter: Secondary | ICD-10-CM | POA: Diagnosis not present

## 2017-02-01 DIAGNOSIS — L89153 Pressure ulcer of sacral region, stage 3: Secondary | ICD-10-CM | POA: Diagnosis present

## 2017-02-01 DIAGNOSIS — E46 Unspecified protein-calorie malnutrition: Secondary | ICD-10-CM | POA: Diagnosis not present

## 2017-02-01 DIAGNOSIS — L89614 Pressure ulcer of right heel, stage 4: Secondary | ICD-10-CM | POA: Diagnosis not present

## 2017-02-01 LAB — CREATININE, SERUM
CREATININE: 0.96 mg/dL (ref 0.61–1.24)
GFR calc non Af Amer: >60 mL/min (ref >60)

## 2017-02-01 LAB — BASIC METABOLIC PANEL
Anion gap: 8 (ref 5–15)
BUN: 30 mg/dL — AB (ref 6–20)
CO2: 28 mmol/L (ref 22–32)
CREATININE: 0.86 mg/dL (ref 0.61–1.24)
Calcium: 8.1 mg/dL — ABNORMAL LOW (ref 8.9–10.3)
Chloride: 111 mmol/L (ref 101–111)
GFR calc Af Amer: >60 mL/min (ref >60)
Glucose, Bld: 116 mg/dL — ABNORMAL HIGH (ref 65–99)
POTASSIUM: 3.3 mmol/L — AB (ref 3.5–5.1)
SODIUM: 147 mmol/L — AB (ref 135–145)

## 2017-02-01 LAB — CBC
HCT: 33.2 % — ABNORMAL LOW (ref 39.0–52.0)
HCT: 36.1 % — ABNORMAL LOW (ref 39.0–52.0)
HEMOGLOBIN: 12.1 g/dL — AB (ref 13.0–17.0)
Hemoglobin: 10.8 g/dL — ABNORMAL LOW (ref 13.0–17.0)
MCH: 31.3 pg (ref 26.0–34.0)
MCH: 32.4 pg (ref 26.0–34.0)
MCHC: 32.5 g/dL (ref 30.0–36.0)
MCHC: 33.5 g/dL (ref 30.0–36.0)
MCV: 96.2 fL (ref 78.0–100.0)
MCV: 96.5 fL (ref 78.0–100.0)
PLATELETS: 237 10*3/uL (ref 150–400)
Platelets: 230 10*3/uL (ref 150–400)
RBC: 3.45 MIL/uL — ABNORMAL LOW (ref 4.22–5.81)
RBC: 3.74 MIL/uL — AB (ref 4.22–5.81)
RDW: 13.4 % (ref 11.5–15.5)
RDW: 13.5 % (ref 11.5–15.5)
WBC: 6.9 10*3/uL (ref 4.0–10.5)
WBC: 7.6 10*3/uL (ref 4.0–10.5)

## 2017-02-01 LAB — TSH: TSH: 1.195 u[IU]/mL (ref 0.350–4.500)

## 2017-02-01 LAB — CK: CK TOTAL: 3175 U/L — AB (ref 49–397)

## 2017-02-01 MED ORDER — FLUTICASONE PROPIONATE 50 MCG/ACT NA SUSP: 2.0000 | Freq: Every day | NASAL | Status: DC | PRN

## 2017-02-01 MED ORDER — ACETAMINOPHEN 325 MG PO TABS
650.0000 mg | ORAL_TABLET | Freq: Four times a day (QID) | ORAL | Status: DC | PRN
  Administered 2017-02-02 – 2017-02-04 (×3): 650 mg via ORAL
  Filled 2017-02-01 (×3): qty 2

## 2017-02-01 MED ORDER — OMEGA-3-ACID ETHYL ESTERS 1 G PO CAPS
1.0000 g | ORAL_CAPSULE | Freq: Every day | ORAL | Status: DC
  Administered 2017-02-02 – 2017-02-04 (×3): 1 g via ORAL
  Filled 2017-02-01 (×4): qty 1

## 2017-02-01 MED ORDER — MEMANTINE HCL 10 MG PO TABS
10.0000 mg | ORAL_TABLET | Freq: Two times a day (BID) | ORAL | Status: DC
  Administered 2017-02-01 – 2017-02-04 (×6): 10 mg via ORAL
  Filled 2017-02-01 (×6): qty 1

## 2017-02-01 MED ORDER — ACETAMINOPHEN 650 MG RE SUPP: 650.0000 mg | Freq: Four times a day (QID) | RECTAL | Status: DC | PRN

## 2017-02-01 MED ORDER — POTASSIUM CHLORIDE CRYS ER 20 MEQ PO TBCR
40.0000 meq | EXTENDED_RELEASE_TABLET | ORAL | Status: AC
  Administered 2017-02-01: 40 meq via ORAL
  Filled 2017-02-01: qty 2

## 2017-02-01 MED ORDER — MONTELUKAST SODIUM 10 MG PO TABS
10.0000 mg | ORAL_TABLET | Freq: Every day | ORAL | Status: DC
  Administered 2017-02-01 – 2017-02-03 (×3): 10 mg via ORAL
  Filled 2017-02-01 (×3): qty 1

## 2017-02-01 MED ORDER — ENOXAPARIN SODIUM 40 MG/0.4ML ~~LOC~~ SOLN
40.0000 mg | SUBCUTANEOUS | Status: DC
  Administered 2017-02-01 – 2017-02-04 (×4): 40 mg via SUBCUTANEOUS
  Filled 2017-02-01 (×4): qty 0.4

## 2017-02-01 MED ORDER — TAMSULOSIN HCL 0.4 MG PO CAPS
0.4000 mg | ORAL_CAPSULE | Freq: Every day | ORAL | Status: DC
  Administered 2017-02-01 – 2017-02-04 (×4): 0.4 mg via ORAL
  Filled 2017-02-01 (×4): qty 1

## 2017-02-01 MED ORDER — DONEPEZIL HCL 5 MG PO TABS
10.0000 mg | ORAL_TABLET | Freq: Every day | ORAL | Status: DC
  Administered 2017-02-01 – 2017-02-03 (×3): 10 mg via ORAL
  Filled 2017-02-01 (×3): qty 2

## 2017-02-01 MED ORDER — DUTASTERIDE 0.5 MG PO CAPS
0.5000 mg | ORAL_CAPSULE | Freq: Every day | ORAL | Status: DC
  Administered 2017-02-02 – 2017-02-04 (×3): 0.5 mg via ORAL
  Filled 2017-02-01 (×4): qty 1

## 2017-02-01 MED ORDER — ASPIRIN 81 MG PO CHEW
81.0000 mg | CHEWABLE_TABLET | Freq: Every day | ORAL | Status: DC
  Administered 2017-02-01 – 2017-02-04 (×4): 81 mg via ORAL
  Filled 2017-02-01 (×4): qty 1

## 2017-02-01 MED ORDER — LISINOPRIL 10 MG PO TABS
10.0000 mg | ORAL_TABLET | Freq: Every day | ORAL | Status: DC
  Administered 2017-02-01 – 2017-02-04 (×4): 10 mg via ORAL
  Filled 2017-02-01 (×4): qty 1

## 2017-02-01 MED ORDER — SERTRALINE HCL 25 MG PO TABS
25.0000 mg | ORAL_TABLET | Freq: Every day | ORAL | Status: DC
  Administered 2017-02-01 – 2017-02-04 (×4): 25 mg via ORAL
  Filled 2017-02-01 (×4): qty 1

## 2017-02-01 MED ORDER — ENSURE ENLIVE PO LIQD
237.0000 mL | Freq: Two times a day (BID) | ORAL | Status: DC
  Administered 2017-02-03 (×2): 237 mL via ORAL

## 2017-02-01 MED ORDER — ADULT MULTIVITAMIN LIQUID CH
15.0000 mL | Freq: Every day | ORAL | Status: DC
  Administered 2017-02-01 – 2017-02-04 (×4): 15 mL via ORAL
  Filled 2017-02-01 (×4): qty 15

## 2017-02-01 MED ORDER — NITROGLYCERIN 0.4 MG SL SUBL: 0.4000 mg | SUBLINGUAL_TABLET | SUBLINGUAL | Status: DC | PRN

## 2017-02-01 NOTE — Progress Notes (Addendum)
Initial Nutrition Assessment  DOCUMENTATION CODES:   Non-severe (moderate) malnutrition in context of chronic illness  INTERVENTION:   Ensure Enlive po BID, each supplement provides 350 kcal and 20 grams of protein  Magic cup TID with meals, each supplement provides 290 kcal and 9 grams of protein  MVI- liquid  NUTRITION DIAGNOSIS:   Malnutrition (severe) related to chronic illness (dementia) as evidenced by 14 percent weight loss in 3 months, moderate depletions of muscle mass, moderate depletion of body fat.  GOAL:   Patient will meet greater than or equal to 90% of their needs  MONITOR:   PO intake, Supplement acceptance, Labs, Weight trends, Skin  REASON FOR ASSESSMENT:   Low Braden    ASSESSMENT:   76 y.o. male with history of advanced dementia has been brought to the ER because of increasing weakness frequent falls.    Unable to communicate with pt; history obtained from family members at bedside. Family reports that pt's appetite has been slowly declining over the past year but has severely decreased over the past two weeks. Family reports that they have noticed patient losing weight and that pt was starting to loose firmness in arms. Pt NPO this morning for ultrasound. Per chart, pt has lost 25lbs(14%) over the past three months; this is severe. Family reports that pt has never had supplements before but that he does take a daily multivitamin that they report he has trouble swallowing. RD will order liquid multivitamin per family request. RD discussed with family the importance of adequate protein intake to preserve lean muscle mass and prevent weakness. Family would like to have Ensure and [employer]; they will encourage intake. Pt with Stage II pressure injury on buttocks and laceration on foot from previous fall. Plan is for pt to discharge back to SNF. Pt with hypokalemia today; monitor and supplement per MD discretion.   Medications reviewed and include: aspirin,  lovenox, Omega-3, KCl  Labs reviewed: Na 147(H), K 3.3(L), BUN 30(H), Ca 8.1(L) adj. 8.98 wnl, alb 2.9(L)  Nutrition-Focused physical exam completed. Findings are moderate fat depletion in chest and upper arms, moderate muscle depletion over upper body, and mild edema.   Diet Order:  Diet Heart Room service appropriate? Yes; Fluid consistency: Thin  Skin:  Wound (see comment) (Stage I buttocks)  Last BM:  none since admit  Height:   Ht Readings from Last 1 Encounters:  01/31/17  (1.803 m)    Weight:   Wt Readings from Last 1 Encounters:  02/01/17 150 lb 9.2 oz (68.3 kg)    Ideal Body Weight:  78.1 kg  BMI:  Body mass index is 21 kg/m.  Estimated Nutritional Needs:   Kcal:  1850-2150kcal/day   Protein:  89-102g/day   Fluid:  >1.8L/day   EDUCATION NEEDS:   No education needs identified at this time  Betsey Holiday, RD, LDN Pager #[phone]

## 2017-02-01 NOTE — Progress Notes (Signed)
CSW consulted for SNF placement. Pt has been admitted under observation status. PT eval is pending.  Medicare will not cover cost of SNF placement due to admission status. If PT recommends SNF CSW is able to assist with private pay SNF placement if pt / family are interested in this option.  Cori Razor LCSW [phone]

## 2017-02-01 NOTE — Clinical Social Work Placement (Signed)
   CLINICAL SOCIAL WORK PLACEMENT  NOTE  Date:  02/01/2017  Patient Details  Name: Andre Ball MRN: [id number] Date of Birth: 1941/04/28  Clinical Social Work is seeking post-discharge placement for this patient at the Skilled  Nursing Facility level of care (*CSW will initial, date and re-position this form in  chart as items are completed):  Yes   Patient/family provided with [facility] Clinical Social Work Department's list of facilities offering this level of care within the geographic area requested by the patient (or if unable, by the patient's family).  Yes   Patient/family informed of their freedom to choose among providers that offer the needed level of care, that participate in Medicare, Medicaid or managed care program needed by the patient, have an available bed and are willing to accept the patient.  Yes   Patient/family informed of [facility]'s ownership interest in [facility] and [facility], as well as of the fact that they are under no obligation to receive care at these facilities.  PASRR submitted to EDS on 02/01/17     PASRR number received on 02/01/17     Existing PASRR number confirmed on       FL2 transmitted to all facilities in geographic area requested by pt/family on 02/01/17     FL2 transmitted to all facilities within larger geographic area on       Patient informed that his/her managed care company has contracts with or will negotiate with certain facilities, including the following:            Patient/family informed of bed offers received.  Patient chooses bed at       Physician recommends and patient chooses bed at      Patient to be transferred to   on  .  Patient to be transferred to facility by       Patient family notified on   of transfer.  Name of family member notified:        PHYSICIAN       Additional Comment:    _______________________________________________ Royetta Asal, [place]  [phone] 02/01/2017,  4:51 PM

## 2017-02-01 NOTE — NC FL2 (Signed)
[place] MEDICAID FL2 LEVEL OF CARE SCREENING TOOL     IDENTIFICATION  Patient Name: Andre Ball Birthdate: 20-Dec-1940 Sex: male Admission Date (Current Location): 01/31/2017  [facility] and [place] Number:  Producer, television/film/video and Address:  [facility],  [address] [place], [place] [address]      Provider Number: [id number]  Attending Physician Name and Address:  Lenox Ponds, MD  Relative Name and Phone Number:       Current Level of Care: Hospital Recommended Level of Care: Skilled Nursing Facility Prior Approval Number:    Date Approved/Denied:   PASRR Number: [id number]  Discharge Plan: SNF    Current Diagnoses: Patient Active Problem List   Diagnosis Date Noted  . Rhabdomyolysis 02/01/2017  . Dehydration 01/31/2017  . Hypernatremia 01/31/2017  . General weakness 01/31/2017  . Essential hypertension 01/31/2017  . Dementia 01/31/2017  . Memory deficits 11/01/2014    Orientation RESPIRATION BLADDER Height & Weight     Self, Time, Situation, Place  Normal Incontinent Weight: 150 lb 9.2 oz (68.3 kg) Height:   (180.3 cm)  BEHAVIORAL SYMPTOMS/MOOD NEUROLOGICAL BOWEL NUTRITION STATUS  Other (Comment)   Incontinent Diet  AMBULATORY STATUS COMMUNICATION OF NEEDS Skin   Extensive Assist Verbally Other (Comment) (Stage 2 pressure ulcer on buttocks / heel)                       Personal Care Assistance Level of Assistance  Bathing, Feeding, Dressing Bathing Assistance: Maximum assistance Feeding assistance: Limited assistance Dressing Assistance: Maximum assistance     Functional Limitations Info  Sight, Hearing, Speech Sight Info: Adequate Hearing Info: Adequate Speech Info: Adequate    SPECIAL CARE FACTORS FREQUENCY  PT (By licensed PT), OT (By licensed OT)     PT Frequency: 5x wk OT Frequency: 5x wk            Contractures Contractures Info: Not present    Additional Factors Info  Code Status Code Status Info:  DNR             Current Medications (02/01/2017):  This is the current hospital active medication list Current Facility-Administered Medications  Medication Dose Route Frequency Provider Last Rate Last Dose  . acetaminophen (TYLENOL) tablet 650 mg  650 mg Oral Q6H PRN Eduard Clos, MD       Or  . acetaminophen (TYLENOL) suppository 650 mg  650 mg Rectal Q6H PRN Eduard Clos, MD      . aspirin chewable tablet 81 mg  81 mg Oral Daily Eduard Clos, MD   81 mg at 02/01/17 1436  . dextrose 5 % 1,000 mL infusion   Intravenous Continuous Lenox Ponds, MD 100 mL/hr at 02/01/17 [phone]    . donepezil (ARICEPT) tablet 10 mg  10 mg Oral QHS Eduard Clos, MD      . dutasteride (AVODART) capsule 0.5 mg  0.5 mg Oral Daily Eduard Clos, MD      . enoxaparin (LOVENOX) injection 40 mg  40 mg Subcutaneous Q24H Eduard Clos, MD   40 mg at 02/01/17 1434  . feeding supplement (ENSURE ENLIVE) (ENSURE ENLIVE) liquid 237 mL  237 mL Oral BID BM Lenox Ponds, MD      . fluticasone [facility]) 50 MCG/ACT nasal spray 2 spray  2 spray Each Nare Daily PRN Eduard Clos, MD      . lisinopril (PRINIVIL,ZESTRIL) tablet 10 mg  10 mg Oral  Daily Eduard Clos, MD   10 mg at 02/01/17 1436  . memantine (NAMENDA) tablet 10 mg  10 mg Oral BID Eduard Clos, MD      . montelukast (SINGULAIR) tablet 10 mg  10 mg Oral QHS Eduard Clos, MD      . multivitamin liquid 15 mL  15 mL Oral Daily Lenox Ponds, MD   15 mL at 02/01/17 1436  . nitroGLYCERIN (NITROSTAT) SL tablet 0.4 mg  0.4 mg Sublingual Q5 min PRN Eduard Clos, MD      . omega-3 acid ethyl esters (LOVAZA) capsule 1 g  1 g Oral Daily Eduard Clos, MD      . potassium chloride SA (K-DUR,KLOR-CON) CR tablet 40 mEq  40 mEq Oral Q4H Lenox Ponds, MD   40 mEq at 02/01/17 1435  . sertraline (ZOLOFT) tablet 25 mg  25 mg Oral Daily Eduard Clos, MD   25 mg at 02/01/17 1436  .  tamsulosin (FLOMAX) capsule 0.4 mg  0.4 mg Oral Daily Eduard Clos, MD   0.4 mg at 02/01/17 1435     Discharge Medications: Please see discharge summary for a list of discharge medications.  Relevant Imaging Results:  Relevant Lab Results:   Additional Information ss # [id number]  Nhung Danko, Dickey Gave, LCSW

## 2017-02-01 NOTE — Evaluation (Signed)
Physical Therapy Evaluation Patient Details Name: Andre Ball MRN: [id number] DOB: Oct 08, 1941 Today's Date: 02/01/2017   History of Present Illness  76 yo male admitted with dehydration, weakness, falls. Hxof Adv dementia, HTN  Clinical Impression  On eval, pt required Max-Total assist for bed mobility. Pt sat EOB ~5 minutes with Min guard assist while performing UE and LE exercises. Family present during session. Son lives in [place]. Pt's wife stated she is unable to safely manage with pt at home. Recommend SNF if this is an option. If pt returns home, recommend HHPT, HHOT, home health aide, hospital bed, ambulance transport home.     Follow Up Recommendations SNF    Equipment Recommendations   (TBD at next venue)    Recommendations for Other Services       Precautions / Restrictions Precautions Precautions: Fall Restrictions Weight Bearing Restrictions: No      Mobility  Bed Mobility Overal bed mobility: Needs Assistance Bed Mobility: Rolling;Supine to Sit;Sit to Supine Rolling: Mod assist   Supine to sit: Total assist Sit to supine: Max assist   General bed mobility comments: Assist for trunk and bil LEs. Max multimodal cueing required. Pt follows 1 step commands inconsistently. Utilized bedpad for scooting, positioning  Transfers                 General transfer comment: NT-pt unable with +1 assist  Ambulation/Gait                Stairs            Wheelchair Mobility    Modified Rankin (Stroke Patients Only)       Balance Overall balance assessment: Needs assistance   Sitting balance-Leahy Scale: Fair Sitting balance - Comments: Sat EOB ~5 mintues with Min guard assist while performing UE and LE exercises                                     Pertinent Vitals/Pain Pain Assessment: Faces Pain Location: shoulder Pain Intervention(s): Limited activity within patient's tolerance;Repositioned    Home Living  Family/patient expects to be discharged to:: Unsure Living Arrangements: Spouse/significant other Available Help at Discharge: Family Type of Home: House                Prior Function Level of Independence: Needs assistance   Gait / Transfers Assistance Needed: per family, for last month pt has been requiring significant assistance for ADLs, mobility.   ADL's / Homemaking Assistance Needed: family assists with ADLs, meals/feeding        Hand Dominance        Extremity/Trunk Assessment   Upper Extremity Assessment Upper Extremity Assessment: Generalized weakness    Lower Extremity Assessment Lower Extremity Assessment: Difficult to assess due to impaired cognition;Generalized weakness    Cervical / Trunk Assessment Cervical / Trunk Assessment: Kyphotic  Communication   Communication: Expressive difficulties (difficult to understand)  Cognition Arousal/Alertness: Lethargic Behavior During Therapy: Flat affect Overall Cognitive Status: History of cognitive impairments - at baseline                                        General Comments      Exercises General Exercises - Upper Extremity Shoulder Flexion: AROM;Both;10 reps;Seated General Exercises - Lower Extremity Long Arc Quad: AROM;Both;10 reps;Seated   Assessment/Plan  PT Assessment Patient needs continued PT services  PT Problem List Decreased strength;Decreased mobility;Decreased activity tolerance;Decreased balance;Decreased knowledge of use of DME;Decreased cognition       PT Treatment Interventions DME instruction;Gait training;Therapeutic activities;Therapeutic exercise;Patient/family education;Functional mobility training;Balance training    PT Goals (Current goals can be found in the Care Plan section)  Acute Rehab PT Goals Patient Stated Goal: none stated by pt. family appears interested in placement PT Goal Formulation: With family Time For Goal Achievement:  02/15/17 Potential to Achieve Goals: Fair    Frequency Min 3X/week   Barriers to discharge Decreased caregiver support pt's wife is unable to safely manage with pt at home    Co-evaluation               End of Session   Activity Tolerance: Patient limited by fatigue (Limited by cognition) Patient left: in bed;with call bell/phone within reach;with family/visitor present;with bed alarm set   PT Visit Diagnosis: Muscle weakness (generalized) (M62.81);Difficulty in walking, not elsewhere classified (R26.2)    Time: 6578-4696 PT Time Calculation (min) (ACUTE ONLY): 22 min   Charges:   PT Evaluation $PT Eval Moderate Complexity: 1 Procedure     PT G Codes:   PT G-Codes **NOT FOR INPATIENT CLASS** Functional Assessment Tool Used: AM-PAC 6 Clicks Basic Mobility;Clinical judgement Functional Limitation: Mobility: Walking and moving around Mobility: Walking and Moving Around Current Status (E9528): At least 60 percent but less than 80 percent impaired, limited or restricted Mobility: Walking and Moving Around Goal Status [phone]): At least 40 percent but less than 60 percent impaired, limited or restricted     Rebeca Alert [facility] 02/01/2017, 12:42 PM

## 2017-02-01 NOTE — H&P (Signed)
History and Physical    Andre Ball:[id number] DOB: [birth year] DOA: 01/31/2017  PCP: Evlyn Courier, MD  Patient coming from: Home.  History obtained from patient's son as patient has dementia.  Chief Complaint: Weakness and frequent falls.  HPI: Andre Ball is a 76 y.o. male with history of advanced dementia has been brought to the ER because of increasing weakness frequent falls. Patient has been rapidly declining over the last 2 months with poor appetite frequent falls and weakness. Patient was brought to the ER 2 weeks ago and CT head of the time was negative and was discharged home. Patient also was brought in February and at that time CT head was negative. Patient's son states that patient also has not been eating well but did not have any nausea vomiting or diarrhea. Has been hallucinating and recently was started on Risperdal and Zoloft.   ED Course: CT of the head and C-spine was unremarkable. Patient had some laceration of the right foot from recent fall. Lab works revealed hypernatremia and mildly elevated LFTs. On exam patient appears nonfocal but has dementia. Patient being admitted for dehydration and hydration.  Review of Systems: As per HPI, rest all negative.   Past Medical History:  Diagnosis Date  . Arthritis   . Asthma   . BPH (benign prostatic hyperplasia)   . Memory deficits 11/01/2014    Past Surgical History:  Procedure Laterality Date  . TONSILLECTOMY       reports that he has quit smoking. He has never used smokeless tobacco. He reports that he does not drink alcohol or use drugs.  No Known Allergies  Family History  Problem Relation Age of Onset  . Alzheimer's disease Mother   . Alzheimer's disease Father   . Diabetes Brother   . Alzheimer's disease Sister   . Heart disease Brother   . Diabetes Brother   . Alzheimer's disease Sister   . Alzheimer's disease Brother   . Alzheimer's disease Brother     Prior to Admission medications    Medication Sig Start Date End Date Taking? Authorizing Provider  aspirin 81 MG tablet Take 81 mg by mouth daily.   Yes Historical Provider, MD  Cyanocobalamin (VITAMIN B 12 PO) Take 1 tablet by mouth daily.    Yes Historical Provider, MD  diclofenac sodium (VOLTAREN) 1 % GEL Apply 1 application topically as needed (for pain).   Yes Historical Provider, MD  donepezil (ARICEPT) 10 MG tablet Take 1 tablet (10 mg total) by mouth at bedtime. 08/20/16  Yes York Spaniel, MD  dutasteride (AVODART) 0.5 MG capsule Take 0.5 mg by mouth daily. 11/02/16  Yes Historical Provider, MD  fluticasone (FLONASE) 50 MCG/ACT nasal spray Place 2 sprays into the nose daily. Patient taking differently: Place 2 sprays into the nose daily as needed for allergies.  01/30/13  Yes Reuben Likes, MD  lisinopril (PRINIVIL,ZESTRIL) 10 MG tablet Take 10 mg by mouth daily. 06/26/16  Yes Historical Provider, MD  memantine (NAMENDA) 10 MG tablet Take 1 tablet (10 mg total) by mouth 2 (two) times daily. 08/20/16  Yes York Spaniel, MD  montelukast (SINGULAIR) 10 MG tablet Take 10 mg by mouth at bedtime.   Yes Historical Provider, MD  Multiple Vitamins-Minerals (MULTIVITAMIN PO) Take 1 tablet by mouth daily.   Yes Historical Provider, MD  Omega-3 Fatty Acids (FISH OIL PO) Take 1 capsule by mouth daily.   Yes Historical Provider, MD  risperiDONE (RISPERDAL) 1 MG  tablet Take 1 tablet (1 mg total) by mouth 2 (two) times daily. Patient taking differently: Take 1 mg by mouth daily.  12/12/16 01/31/17 Yes Alvira Monday, MD  sertraline (ZOLOFT) 25 MG tablet Take 25 mg by mouth daily.   Yes Historical Provider, MD  tamsulosin (FLOMAX) 0.4 MG CAPS Take 0.4 mg by mouth daily.   Yes Historical Provider, MD  nitroGLYCERIN (NITROSTAT) 0.4 MG SL tablet Place 0.4 mg under the tongue every 5 (five) minutes as needed for chest pain.    Historical Provider, MD  oxaprozin (DAYPRO) 600 MG tablet Take 1 tablet (600 mg total) by mouth 2 (two) times  daily. Patient not taking: Reported on 01/16/2017 11/13/16   Helane Gunther, DPM    Physical Exam: Vitals:   01/31/17 1833 01/31/17 2050 01/31/17 2356  BP: 133/64 (!) 118/57 (!) 122/52  Pulse: 71 82 (!) 58  Resp: Temp: 97.1 F (36.2 C)  98 F (36.7 C)  TempSrc: Oral  Oral  SpO2: 99% 97% 95%  Weight:   67.2 kg (148 lb 2.4 oz)      Constitutional: Moderately built and nourished. Vitals:   01/31/17 1833 01/31/17 2050 01/31/17 2356  BP: 133/64 (!) 118/57 (!) 122/52  Pulse: 71 82 (!) 58  Resp: Temp: 97.1 F (36.2 C)  98 F (36.7 C)  TempSrc: Oral  Oral  SpO2: 99% 97% 95%  Weight:   67.2 kg (148 lb 2.4 oz)   Eyes: Anicteric. no pallor. ENMT: No discharge from the ears eyes nose and mouth. Neck: No mass felt. No neck rigidity. Respiratory: No rhonchi or crepitations. Cardiovascular: S1 and S2 heard no murmurs appreciated. Abdomen: Soft nontender bowel sounds present. Musculoskeletal: Right foot dressing done. Skin: Right foot dressing done. Neurologic: Alert awake oriented to his name. Moves all extremities. Psychiatric: Has dementia.   Labs on Admission: I have personally reviewed following labs and imaging studies  CBC:  Recent Labs Lab 01/31/17 1958  WBC 8.5  NEUTROABS 7.0  HGB 11.9*  HCT 36.7*  MCV 99.2  PLT 257   Basic Metabolic Panel:  Recent Labs Lab 01/31/17 1958  NA 149*  K 3.6  CL 112*  CO2 32  GLUCOSE 103*  BUN 33*  CREATININE 0.99  CALCIUM 8.6*   GFR: Estimated Creatinine Clearance: 59.1 mL/min (by C-G formula based on SCr of 0.99 mg/dL). Liver Function Tests:  Recent Labs Lab 01/31/17 1958  AST 145*  ALT 74*  ALKPHOS 56  BILITOT 0.8  PROT 6.5  ALBUMIN 2.9*   No results for input(s): LIPASE, AMYLASE in the last 168 hours. No results for input(s): AMMONIA in the last 168 hours. Coagulation Profile: No results for input(s): INR, PROTIME in the last 168 hours. Cardiac Enzymes: No results for input(s):  CKTOTAL, CKMB, CKMBINDEX, TROPONINI in the last 168 hours. BNP (last 3 results) No results for input(s): PROBNP in the last 8760 hours. HbA1C: No results for input(s): HGBA1C in the last 72 hours. CBG: No results for input(s): GLUCAP in the last 168 hours. Lipid Profile: No results for input(s): CHOL, HDL, LDLCALC, TRIG, CHOLHDL, LDLDIRECT in the last 72 hours. Thyroid Function Tests: No results for input(s): TSH, T4TOTAL, FREET4, T3FREE, THYROIDAB in the last 72 hours. Anemia Panel: No results for input(s): VITAMINB12, FOLATE, FERRITIN, TIBC, IRON, RETICCTPCT in the last 72 hours. Urine analysis:    Component Value Date/Time   COLORURINE YELLOW 01/17/2017 0257   APPEARANCEUR CLEAR 01/17/2017 0257  LABSPEC 1.024 01/17/2017 0257   PHURINE 5.0 01/17/2017 0257   GLUCOSEU NEGATIVE 01/17/2017 0257   HGBUR MODERATE (A) 01/17/2017 0257   BILIRUBINUR NEGATIVE 01/17/2017 0257   KETONESUR 20 (A) 01/17/2017 0257   PROTEINUR NEGATIVE 01/17/2017 0257   NITRITE NEGATIVE 01/17/2017 0257   LEUKOCYTESUR NEGATIVE 01/17/2017 0257   Sepsis Labs: (procalcitonin:4,lacticidven:4) )No results found for this or any previous visit (from the past 240 hour(s)).   Radiological Exams on Admission: Dg Pelvis 1-2 Views  Result Date: 01/31/2017 CLINICAL DATA:  Fall with buttock pain EXAM: PELVIS - 1-2 VIEW COMPARISON:  None. FINDINGS: SI joints are symmetric bilaterally. The pubic symphysis appears intact. Both femoral heads appear normally positioned. IMPRESSION: No definite acute osseous abnormality Electronically Signed   By: Jasmine Pang M.D.   On: 01/31/2017 23:42   Ct Head Wo Contrast  Result Date: 01/31/2017 CLINICAL DATA:  Larey Seat last night.  Ecchymosis. EXAM: CT HEAD WITHOUT CONTRAST CT CERVICAL SPINE WITHOUT CONTRAST TECHNIQUE: Multidetector CT imaging of the head and cervical spine was performed following the standard protocol without intravenous contrast. Multiplanar CT image  reconstructions of the cervical spine were also generated. COMPARISON:  01/16/2017 FINDINGS: CT HEAD FINDINGS Brain: There is no intracranial hemorrhage, mass or evidence of acute infarction. There is moderate generalized atrophy. There is moderate chronic microvascular ischemic change. There is no significant extra-axial fluid collection. No acute intracranial findings are evident. Vascular: No hyperdense vessel or unexpected calcification. Skull: Normal. Negative for fracture or focal lesion. Sinuses/Orbits: No acute finding. Other: None. CT CERVICAL SPINE FINDINGS Alignment: Normal. Skull base and vertebrae: No acute fracture. No primary bone lesion or focal pathologic process. Soft tissues and spinal canal: No prevertebral fluid or swelling. No visible canal hematoma. Disc levels: Moderate cervical degenerative disc changes at C5-6. Facet articulations are arthritic but intact. Upper chest: Negative. Other: None IMPRESSION: 1. No acute intracranial findings. There is moderate generalized atrophy and chronic appearing white matter hypodensities which likely represent small vessel ischemic disease. 2. Negative for acute cervical spine fracture. Electronically Signed   By: Ellery Plunk M.D.   On: 01/31/2017 23:44   Ct Cervical Spine Wo Contrast  Result Date: 01/31/2017 CLINICAL DATA:  Larey Seat last night.  Ecchymosis. EXAM: CT HEAD WITHOUT CONTRAST CT CERVICAL SPINE WITHOUT CONTRAST TECHNIQUE: Multidetector CT imaging of the head and cervical spine was performed following the standard protocol without intravenous contrast. Multiplanar CT image reconstructions of the cervical spine were also generated. COMPARISON:  01/16/2017 FINDINGS: CT HEAD FINDINGS Brain: There is no intracranial hemorrhage, mass or evidence of acute infarction. There is moderate generalized atrophy. There is moderate chronic microvascular ischemic change. There is no significant extra-axial fluid collection. No acute intracranial findings  are evident. Vascular: No hyperdense vessel or unexpected calcification. Skull: Normal. Negative for fracture or focal lesion. Sinuses/Orbits: No acute finding. Other: None. CT CERVICAL SPINE FINDINGS Alignment: Normal. Skull base and vertebrae: No acute fracture. No primary bone lesion or focal pathologic process. Soft tissues and spinal canal: No prevertebral fluid or swelling. No visible canal hematoma. Disc levels: Moderate cervical degenerative disc changes at C5-6. Facet articulations are arthritic but intact. Upper chest: Negative. Other: None IMPRESSION: 1. No acute intracranial findings. There is moderate generalized atrophy and chronic appearing white matter hypodensities which likely represent small vessel ischemic disease. 2. Negative for acute cervical spine fracture. Electronically Signed   By: Ellery Plunk M.D.   On: 01/31/2017 23:44   Dg Chest Port 1 View  Result Date: 01/31/2017 CLINICAL  DATA:  Possible fall last evening.  Altered mental status. EXAM: PORTABLE CHEST 1 VIEW COMPARISON:  01/16/2017 CXR FINDINGS: The heart size and mediastinal contours are within normal limits. Atherosclerosis at the aortic arch without aneurysm. Both lungs are clear. The visualized skeletal structures are unremarkable. IMPRESSION: No active disease. Electronically Signed   By: Tollie Eth M.D.   On: 01/31/2017 20:41    EKG: Independently reviewed. Normal sinus rhythm with PVCs.  Assessment/Plan Principal Problem:   Dehydration Active Problems:   Hypernatremia   General weakness   Essential hypertension   Dementia    1. Generalized weakness and hypernatremia from dehydration - probably from poor oral intake. I have placed patient on D5W. Follow metabolic panel. Check CK levels and TSH. 2. Poor oral intake with elevated LFTs - will check sonogram of abdomen. 3. Hypertension on lisinopril which may need to be discontinued if creatinine worsens. 4. Dementia on Namenda and Aricept. 5. Normocytic  normochromic anemia - follow CBC. 6. Right foot - wound team consult.  Patient may need rehabilitation placement.  DVT prophylaxis: Lovenox. Code Status: DO NOT RESUSCITATE.  Family Communication: Patient's son.  Disposition Plan: To be determined.  Consults called: Physical therapy and social work and wound team.  Admission status: Observation.    Eduard Clos MD [facility] Pager [phone].  If 7PM-7AM, please contact night-coverage [email or web address] Password [email or web address]  02/01/2017, 12:01 AM

## 2017-02-01 NOTE — Consult Note (Addendum)
WOC Nurse wound consult note Reason for Consult: Consult requested for right foot. Family at the bedside to assess the wound. Wound type: Right foot with previous blister which has ruptured and evolved into a stage 2 pressure injury; 5X6X.1cm after loose peeling skin was removed from the wound bed with scissors, revealing red, moist wound bed with mod amt pink drainage, no odor. Pt tolerated without pain or bleeding. Left inner calf with previous blister which has ruptured, revealing a partial thickness wound; 3X2X.1cm, red and moist, no odor, small amt pink drainage. Pressure Injury POA: Yes Dressing procedure/placement/frequency: Family states home health has been following for wound care to right heel.  Aquacel to absorb drainage and provide antimicrobial benefits.  Foam dressing to left leg to promote healing.  Float heel to decrease pressure.  Discussed plan of care and family members verbalized understanding. Please re-consult if further assistance is needed.  Thank-you,  Cammie Mcgee MSN, RN, CWOCN, [place], CNS [phone]

## 2017-02-01 NOTE — Progress Notes (Signed)
Interval Note  [facility]    Admitted after midnight see H&P for full details  76 year old male with history of advanced dementia was to the ED because of weakness and frequent fall. Per family patient has been declining over the past 2 months for appetite and recurrent falls getting out of the bed. She was admitted for dehydration with hypernatremia, found to have CK elevated and getting IV fluids.  Impression Rhabdomyolysis due to frequent falls Continue IV fluids Monitor for fluid overload  Repeat CK in the morning  Shoulder pain - likely due to fall Obtain x-ray - rule out fracture Percocet when necessary  Hypertension Continue home medications  Other chronic conditions stable  Latrelle Dodrill, MD

## 2017-02-01 NOTE — Clinical Social Work Note (Signed)
Clinical Social Work Assessment  Patient Details  Name: Andre Ball MRN: [id number] Date of Birth: 04/18/1941  Date of referral:  02/01/17               Reason for consult:  Discharge Planning, Facility Placement                Permission sought to share information with:  Facility Contact Representative Permission granted to share information::  Yes, Verbal Permission Granted  Name::        Agency::     Relationship::     Contact Information:     Housing/Transportation Living arrangements for the past 2 months:  Single Family Home Source of Information:  Spouse, Adult Children Patient Interpreter Needed:  None Criminal Activity/Legal Involvement Pertinent to Current Situation/Hospitalization:  No - Comment as needed Significant Relationships:  Adult Children, Spouse Lives with:  Spouse Do you feel safe going back to the place where you live?   (SNF recommended.) Need for family participation in patient care:  Yes (Comment)  Care giving concerns:  Spouse cannot manage pt's care at home following hospital d/c.   Social Worker assessment / plan:  Pt hospitalized from home on 01/31/17 under observation status with dehydration and generalized weakness. Pt's admissions status changed on 4/6 to inpatient status. CSW met with pt's spouse and son at bedside to assist with d/c planning. Pt was sleeping soundly during visit. PT has recommended SNF placement at d/c. Family is in agreement with this plan and have requested [facility] for placement. Family feels this may be a LTC placement. Medicaid info provided. SNF search initiated and [facility] contacted and family's request. CSW will continue to follow to assist with d/c planning needs.  Employment status:  Retired Insurance information:  Medicare PT Recommendations:  Skilled Nursing Facility Information / Referral to community resources:  Skilled Nursing Facility  Patient/Family's Response to care:  Requesting SNF  placement.  Patient/Family's Understanding of and Emotional Response to Diagnosis, Current Treatment, and Prognosis:  Family is aware of pt's medical status. Spouse has been overwhelmed trying to care for pt at home and is relieved SNF placement may be possible.  Emotional Assessment Appearance:  Appears stated age Attitude/Demeanor/Rapport:  Unable to Assess Affect (typically observed):  Unable to Assess Orientation:  Oriented to Self, Oriented to Place, Oriented to  Time, Oriented to Situation Alcohol / Substance use:  Not Applicable Psych involvement (Current and /or in the community):  No (Comment)  Discharge Needs  Concerns to be addressed:  Discharge Planning Concerns Readmission within the last 30 days:  No Current discharge risk:  None Barriers to Discharge:  No Barriers Identified   ,  Lee, LCSW  [phone] 02/01/2017, 4:11 PM  

## 2017-02-02 DIAGNOSIS — L89892 Pressure ulcer of other site, stage 2: Secondary | ICD-10-CM

## 2017-02-02 DIAGNOSIS — N289 Disorder of kidney and ureter, unspecified: Secondary | ICD-10-CM

## 2017-02-02 DIAGNOSIS — T796XXD Traumatic ischemia of muscle, subsequent encounter: Secondary | ICD-10-CM

## 2017-02-02 DIAGNOSIS — W19XXXD Unspecified fall, subsequent encounter: Secondary | ICD-10-CM

## 2017-02-02 LAB — BASIC METABOLIC PANEL
ANION GAP: 7 (ref 5–15)
BUN: 26 mg/dL — ABNORMAL HIGH (ref 6–20)
CALCIUM: 8 mg/dL — AB (ref 8.9–10.3)
CO2: 28 mmol/L (ref 22–32)
CREATININE: 0.87 mg/dL (ref 0.61–1.24)
Chloride: 106 mmol/L (ref 101–111)
Glucose, Bld: 109 mg/dL — ABNORMAL HIGH (ref 65–99)
Potassium: 3.9 mmol/L (ref 3.5–5.1)
SODIUM: 141 mmol/L (ref 135–145)

## 2017-02-02 LAB — CBC WITH DIFFERENTIAL/PLATELET
BASOS ABS: 0 10*3/uL (ref 0.0–0.1)
Basophils Relative: 0 %
EOS ABS: 0.3 10*3/uL (ref 0.0–0.7)
EOS PCT: 4 %
HEMATOCRIT: 32.2 % — AB (ref 39.0–52.0)
Hemoglobin: 10.6 g/dL — ABNORMAL LOW (ref 13.0–17.0)
Lymphocytes Relative: 21 %
Lymphs Abs: 1.4 10*3/uL (ref 0.7–4.0)
MCH: 32.3 pg (ref 26.0–34.0)
MCHC: 32.9 g/dL (ref 30.0–36.0)
MCV: 98.2 fL (ref 78.0–100.0)
MONO ABS: 0.3 10*3/uL (ref 0.1–1.0)
MONOS PCT: 4 %
NEUTROS ABS: 4.7 10*3/uL (ref 1.7–7.7)
Neutrophils Relative %: 71 %
PLATELETS: 246 10*3/uL (ref 150–400)
RBC: 3.28 MIL/uL — ABNORMAL LOW (ref 4.22–5.81)
RDW: 13.2 % (ref 11.5–15.5)
WBC: 6.6 10*3/uL (ref 4.0–10.5)

## 2017-02-02 LAB — URINALYSIS, ROUTINE W REFLEX MICROSCOPIC
Bacteria, UA: NONE SEEN
Bilirubin Urine: NEGATIVE
GLUCOSE, UA: NEGATIVE mg/dL
HGB URINE DIPSTICK: NEGATIVE
Ketones, ur: NEGATIVE mg/dL
Leukocytes, UA: NEGATIVE
NITRITE: NEGATIVE
PROTEIN: NEGATIVE mg/dL
SPECIFIC GRAVITY, URINE: 1.03 (ref 1.005–1.030)
pH: 5 (ref 5.0–8.0)

## 2017-02-02 LAB — CK: CK TOTAL: 1084 U/L — AB (ref 49–397)

## 2017-02-02 LAB — SODIUM, URINE, RANDOM: SODIUM UR: 34 mmol/L

## 2017-02-02 MED ORDER — OXYCODONE-ACETAMINOPHEN 5-325 MG PO TABS: 1.0000 | ORAL_TABLET | ORAL | Status: DC | PRN

## 2017-02-02 MED ORDER — RISPERIDONE 1 MG PO TABS
1.0000 mg | ORAL_TABLET | Freq: Every day | ORAL | Status: DC
  Administered 2017-02-02 – 2017-02-04 (×3): 1 mg via ORAL
  Filled 2017-02-02 (×3): qty 1

## 2017-02-02 NOTE — Progress Notes (Signed)
PROGRESS NOTE [facility]   Andre Ball   WUJ:[id number] DOB: January 08, 1941  DOA: 01/31/2017 PCP: Evlyn Courier, MD   Brief Narrative: 76 year old male with history of advanced dementia was to the ED because of weakness and frequent fall. Per family patient has been declining over the past 2 months for appetite and recurrent falls getting out of the bed. He was admitted for dehydration with hypernatremia, found to have CK elevated and getting IV fluids.  Subjective: Patient seen and examined with wife at bedside, complaining of pain at multiple sites, feet, hip and shoulders. Patient is more awake and interactive than yesterday. Eating well, CK trending down.   Assessment & Plan: Rhabdomyolysis due to frequent falls - CK tending down  Continue IV fluids will decrease rate as patient is now tolerating PO  Encourage oral hydration  Monitor for fluid overload  Repeat CK in the morning PT evaluation recommending SNF - patient will be discharge on Monday if medically stable   Shoulder/Hip/Feet pain - likely due to fall No xray needed patient have full range of motion  Percocet when necessary  Hypertension Continue home medications  Stage 2 pressure injury of R foot  Wound care consult appreciated - following recommendations   Protein calorie malnutrition  Following dietitian recommendations  Chronic normocytic anemia - Drop in Hgb due to dilution  Monitor CBC No overt bleeding    DVT prophylaxis: Lovenox  Code Status: DNR Family Communication: Wife at bedside  Disposition Plan: SNF on Monday if labs are stable   Consultants:   WOC  Procedures:  None   Antimicrobials:  None    Objective: Vitals:   02/01/17 1421 02/01/17 2208 02/02/17 0506 02/02/17 1331  BP: (!) 122/54 (!) 110/41 (!) 123/57 (!) 110/48  Pulse: 63 67 64 65  Resp: Temp: 99.3 F (37.4 C) 97.6 F (36.4 C) 97.8 F (36.6 C) 98.6 F (37 C)  TempSrc: Oral Oral Oral Oral    SpO2: 100% 100% 100% 100%  Weight:   70 kg (154 lb 5.2 oz)   Height:        Intake/Output Summary (Last 24 hours) at 02/02/17 1531 Last data filed at 02/02/17 1300  Gross per 24 hour  Intake           2557.5 ml  Output              400 ml  Net           2157.5 ml   Filed Weights   01/31/17 2356 02/01/17 0511 02/02/17 0506  Weight: 67.2 kg (148 lb 2.4 oz) 68.3 kg (150 lb 9.2 oz) 70 kg (154 lb 5.2 oz)    Examination:  General exam: Appears calm and comfortable  HEENT: OP moist and clear Respiratory system: Clear to auscultation.  Cardiovascular system: S1 & S2 heard, RRR. No JVD, murmurs, rubs or gallops Gastrointestinal system: Abdomen is nondistended, soft and nontender.  Central nervous system: Alert and oriented. No focal neurological deficits. Extremities: No pedal edema. Full ROM on all limbs  Skin: Pressure ulcer stage 2 on R foot   Data Reviewed: I have personally reviewed following labs and imaging studies  CBC:  Recent Labs Lab 01/31/17 1958 02/01/17 0001 02/01/17 0543 02/02/17 0554  WBC 8.5 7.6 6.9 6.6  NEUTROABS 7.0  --   --  4.7  HGB 11.9* 12.1* 10.8* 10.6*  HCT 36.7* 36.1* 33.2* 32.2*  MCV 99.2 96.5 96.2 98.2  PLT 257  230 237 246   Basic Metabolic Panel:  Recent Labs Lab 01/31/17 1958 02/01/17 0105 02/01/17 0543 02/02/17 0554  NA 149*  --  147* 141  K 3.6  --  3.3* 3.9  CL 112*  --  111 106  CO2 32  --  28 28  GLUCOSE 103*  --  116* 109*  BUN 33*  --  30* 26*  CREATININE 0.99 0.96 0.86 0.87  CALCIUM 8.6*  --  8.1* 8.0*   GFR: Estimated Creatinine Clearance: 73.8 mL/min (by C-G formula based on SCr of 0.87 mg/dL). Liver Function Tests:  Recent Labs Lab 01/31/17 1958  AST 145*  ALT 74*  ALKPHOS 56  BILITOT 0.8  PROT 6.5  ALBUMIN 2.9*   No results for input(s): LIPASE, AMYLASE in the last 168 hours. No results for input(s): AMMONIA in the last 168 hours. Coagulation Profile: No results for input(s): INR, PROTIME in the last 168  hours. Cardiac Enzymes:  Recent Labs Lab 02/01/17 0105 02/02/17 0554  CKTOTAL 3,175* 1,084*   BNP (last 3 results) No results for input(s): PROBNP in the last 8760 hours. HbA1C: No results for input(s): HGBA1C in the last 72 hours. CBG: No results for input(s): GLUCAP in the last 168 hours. Lipid Profile: No results for input(s): CHOL, HDL, LDLCALC, TRIG, CHOLHDL, LDLDIRECT in the last 72 hours. Thyroid Function Tests:  Recent Labs  02/01/17 0105  TSH 1.195   Anemia Panel: No results for input(s): VITAMINB12, FOLATE, FERRITIN, TIBC, IRON, RETICCTPCT in the last 72 hours. Sepsis Labs:  Recent Labs Lab 01/31/17 2010  LATICACIDVEN 1.23    Radiology Studies: Dg Pelvis 1-2 Views  Result Date: 01/31/2017 CLINICAL DATA:  Fall with buttock pain EXAM: PELVIS - 1-2 VIEW COMPARISON:  None. FINDINGS: SI joints are symmetric bilaterally. The pubic symphysis appears intact. Both femoral heads appear normally positioned. IMPRESSION: No definite acute osseous abnormality Electronically Signed   By: Jasmine Pang M.D.   On: 01/31/2017 23:42   Ct Head Wo Contrast  Result Date: 01/31/2017 CLINICAL DATA:  Larey Seat last night.  Ecchymosis. EXAM: CT HEAD WITHOUT CONTRAST CT CERVICAL SPINE WITHOUT CONTRAST TECHNIQUE: Multidetector CT imaging of the head and cervical spine was performed following the standard protocol without intravenous contrast. Multiplanar CT image reconstructions of the cervical spine were also generated. COMPARISON:  01/16/2017 FINDINGS: CT HEAD FINDINGS Brain: There is no intracranial hemorrhage, mass or evidence of acute infarction. There is moderate generalized atrophy. There is moderate chronic microvascular ischemic change. There is no significant extra-axial fluid collection. No acute intracranial findings are evident. Vascular: No hyperdense vessel or unexpected calcification. Skull: Normal. Negative for fracture or focal lesion. Sinuses/Orbits: No acute finding. Other: None.  CT CERVICAL SPINE FINDINGS Alignment: Normal. Skull base and vertebrae: No acute fracture. No primary bone lesion or focal pathologic process. Soft tissues and spinal canal: No prevertebral fluid or swelling. No visible canal hematoma. Disc levels: Moderate cervical degenerative disc changes at C5-6. Facet articulations are arthritic but intact. Upper chest: Negative. Other: None IMPRESSION: 1. No acute intracranial findings. There is moderate generalized atrophy and chronic appearing white matter hypodensities which likely represent small vessel ischemic disease. 2. Negative for acute cervical spine fracture. Electronically Signed   By: Ellery Plunk M.D.   On: 01/31/2017 23:44   Ct Cervical Spine Wo Contrast  Result Date: 01/31/2017 CLINICAL DATA:  Larey Seat last night.  Ecchymosis. EXAM: CT HEAD WITHOUT CONTRAST CT CERVICAL SPINE WITHOUT CONTRAST TECHNIQUE: Multidetector CT imaging of the head and  cervical spine was performed following the standard protocol without intravenous contrast. Multiplanar CT image reconstructions of the cervical spine were also generated. COMPARISON:  01/16/2017 FINDINGS: CT HEAD FINDINGS Brain: There is no intracranial hemorrhage, mass or evidence of acute infarction. There is moderate generalized atrophy. There is moderate chronic microvascular ischemic change. There is no significant extra-axial fluid collection. No acute intracranial findings are evident. Vascular: No hyperdense vessel or unexpected calcification. Skull: Normal. Negative for fracture or focal lesion. Sinuses/Orbits: No acute finding. Other: None. CT CERVICAL SPINE FINDINGS Alignment: Normal. Skull base and vertebrae: No acute fracture. No primary bone lesion or focal pathologic process. Soft tissues and spinal canal: No prevertebral fluid or swelling. No visible canal hematoma. Disc levels: Moderate cervical degenerative disc changes at C5-6. Facet articulations are arthritic but intact. Upper chest: Negative.  Other: None IMPRESSION: 1. No acute intracranial findings. There is moderate generalized atrophy and chronic appearing white matter hypodensities which likely represent small vessel ischemic disease. 2. Negative for acute cervical spine fracture. Electronically Signed   By: Ellery Plunk M.D.   On: 01/31/2017 23:44   US Abdomen Complete  Result Date: 02/01/2017 CLINICAL DATA:  Abnormal LFTs EXAM: ABDOMEN ULTRASOUND COMPLETE COMPARISON:  Ultrasound 01/08/2005 FINDINGS: Gallbladder: No gallstones or wall thickening visualized. No sonographic Murphy sign noted by sonographer. Common bile duct: Diameter: 4 mm in diameter within normal limits Liver: No focal lesion identified. Within normal limits in parenchymal echogenicity. IVC: No abnormality visualized. Pancreas: Visualized portion unremarkable. Spleen: Size and appearance within normal limits. Measures 4 cm length Right Kidney: Length: 10.4 cm. Echogenicity within normal limits. No mass or hydronephrosis visualized. Left Kidney: Length: 10.4 cm. Echogenicity within normal limits. No hydronephrosis. There is a left renal cyst measures 9 x 9 mm. Abdominal aorta: No aneurysm visualized. Measures up to 2.2 cm in diameter Other findings: None. IMPRESSION: 1. No gallstones are noted within gallbladder.  Normal CBD. 2. No focal hepatic mass.  Normal liver echogenicity. 3. No hydronephrosis. No renal calculi. Left renal cyst measures 9 mm. 4. No aortic aneurysm. Electronically Signed   By: Natasha Mead M.D.   On: 02/01/2017 10:40   Dg Chest Port 1 View  Result Date: 01/31/2017 CLINICAL DATA:  Possible fall last evening.  Altered mental status. EXAM: PORTABLE CHEST 1 VIEW COMPARISON:  01/16/2017 CXR FINDINGS: The heart size and mediastinal contours are within normal limits. Atherosclerosis at the aortic arch without aneurysm. Both lungs are clear. The visualized skeletal structures are unremarkable. IMPRESSION: No active disease. Electronically Signed   By: Tollie Eth M.D.   On: 01/31/2017 20:41      Scheduled Meds: . aspirin  81 mg Oral Daily  . donepezil  10 mg Oral QHS  . dutasteride  0.5 mg Oral Daily  . enoxaparin (LOVENOX) injection  40 mg Subcutaneous Q24H  . feeding supplement (ENSURE ENLIVE)  237 mL Oral BID BM  . lisinopril  10 mg Oral Daily  . memantine  10 mg Oral BID  . montelukast  10 mg Oral QHS  . multivitamin  15 mL Oral Daily  . omega-3 acid ethyl esters  1 g Oral Daily  . sertraline  25 mg Oral Daily  . tamsulosin  0.4 mg Oral Daily   Continuous Infusions:   LOS: 1 day    Latrelle Dodrill, MD Pager: Text Page via [email or web address]  [phone]  If 7PM-7AM, please contact night-coverage [email or web address] Password [email or web address] 02/02/2017, 3:31 PM

## 2017-02-03 DIAGNOSIS — F0390 Unspecified dementia without behavioral disturbance: Secondary | ICD-10-CM

## 2017-02-03 DIAGNOSIS — I10 Essential (primary) hypertension: Secondary | ICD-10-CM

## 2017-02-03 LAB — BASIC METABOLIC PANEL
ANION GAP: 9 (ref 5–15)
BUN: 23 mg/dL — ABNORMAL HIGH (ref 6–20)
CO2: 26 mmol/L (ref 22–32)
Calcium: 8.2 mg/dL — ABNORMAL LOW (ref 8.9–10.3)
Chloride: 105 mmol/L (ref 101–111)
Creatinine, Ser: 0.73 mg/dL (ref 0.61–1.24)
GFR calc Af Amer: >60 mL/min (ref >60)
GLUCOSE: 92 mg/dL (ref 65–99)
POTASSIUM: 3.6 mmol/L (ref 3.5–5.1)
Sodium: 140 mmol/L (ref 135–145)

## 2017-02-03 LAB — CK
CK TOTAL: 822 U/L — AB (ref 49–397)
Total CK: 1122 U/L — ABNORMAL HIGH (ref 49–397)

## 2017-02-03 LAB — CBC
HEMATOCRIT: 35.3 % — AB (ref 39.0–52.0)
HEMOGLOBIN: 11.6 g/dL — AB (ref 13.0–17.0)
MCH: 32 pg (ref 26.0–34.0)
MCHC: 32.9 g/dL (ref 30.0–36.0)
MCV: 97.2 fL (ref 78.0–100.0)
Platelets: 270 10*3/uL (ref 150–400)
RBC: 3.63 MIL/uL — ABNORMAL LOW (ref 4.22–5.81)
RDW: 12.8 % (ref 11.5–15.5)
WBC: 6.1 10*3/uL (ref 4.0–10.5)

## 2017-02-03 MED ORDER — SODIUM CHLORIDE 0.9 % IV SOLN
INTRAVENOUS | Status: DC
  Administered 2017-02-03 (×2): via INTRAVENOUS

## 2017-02-03 NOTE — Progress Notes (Signed)
PROGRESS NOTE [facility]   MANDEL SEIDEN   WUJ:[id number] DOB: [birth year]  DOA: 01/31/2017 PCP: Evlyn Courier, MD   Brief Narrative: 76 year old male with history of advanced dementia was to the ED because of weakness and frequent fall. Per family patient has been declining over the past 2 months for appetite and recurrent falls getting out of the bed. He was admitted for dehydration with hypernatremia, found to have CK elevated and getting IV fluids.  Subjective: Pain improved with the addition of percocet. Patient continues to do well. Eating and interactive   Assessment & Plan: Rhabdomyolysis due to frequent falls - CK un changed today  Did not received IV fluid yesterday due to patient pulling out his IV line multiple time.  Will continue IVF today   Encourage oral hydration  Monitor for fluid overload  Repeat CK in the morning - if trending down will d/c to SNF   Shoulder/Hip/Feet pain - likely due to fall No xray needed patient have full range of motion  Percocet when necessary  Dementia  Continue Aricept  Patient with high risk of sundowning agree with risperidone  Continue to monitor   Hypertension Continue home medications  Stage 2 pressure injury of R foot  Wound care consult appreciated - following recommendations   Protein calorie malnutrition  Following dietitian recommendations  Chronic normocytic anemia - Drop in Hgb due to dilution  Monitor CBC No overt bleeding    DVT prophylaxis: Lovenox  Code Status: DNR Family Communication: Wife at bedside  Disposition Plan: SNF on Monday if labs are stable   Consultants:   WOC  Procedures:  None   Antimicrobials:  None    Objective: Vitals:   02/02/17 1331 02/02/17 2136 02/03/17 0540 02/03/17 1329  BP: (!) 110/48 (!) 117/54 (!) 126/47 (!) 129/58  Pulse: 65 65 (!) 57 63  Resp: Temp: 98.6 F (37 C) 98.5 F (36.9 C) 98.1 F (36.7 C) 98.8 F (37.1 C)  TempSrc: Oral Oral  Axillary Oral  SpO2: 100% 100% 100% 100%  Weight:   71.3 kg (157 lb 3 oz)   Height:        Intake/Output Summary (Last 24 hours) at 02/03/17 1334 Last data filed at 02/03/17 0759  Gross per 24 hour  Intake              900 ml  Output              450 ml  Net              450 ml   Filed Weights   02/01/17 0511 02/02/17 0506 02/03/17 0540  Weight: 68.3 kg (150 lb 9.2 oz) 70 kg (154 lb 5.2 oz) 71.3 kg (157 lb 3 oz)    Examination:  General exam: Calm  Respiratory system: CTA  Cardiovascular system: S1S2 RRR Gastrointestinal system: Abd soft   Central nervous system: Alert and oriented. Extremities: No LE edema  Skin: Pressure ulcer stage 2 on R foot   Data Reviewed: I have personally reviewed following labs and imaging studies  CBC:  Recent Labs Lab 01/31/17 1958 02/01/17 0001 02/01/17 0543 02/02/17 0554 02/03/17 0521  WBC 8.5 7.6 6.9 6.6 6.1  NEUTROABS 7.0  --   --  4.7  --   HGB 11.9* 12.1* 10.8* 10.6* 11.6*  HCT 36.7* 36.1* 33.2* 32.2* 35.3*  MCV 99.2 96.5 96.2 98.2 97.2  PLT 257 230 237 246 270   Basic  Metabolic Panel:  Recent Labs Lab 01/31/17 1958 02/01/17 0105 02/01/17 0543 02/02/17 0554 02/03/17 0521  NA 149*  --  147* 141 140  K 3.6  --  3.3* 3.9 3.6  CL 112*  --  111 106 105  CO2 32  --  GLUCOSE 103*  --  116* 109* 92  BUN 33*  --  30* 26* 23*  CREATININE 0.99 0.96 0.86 0.87 0.73  CALCIUM 8.6*  --  8.1* 8.0* 8.2*   GFR: Estimated Creatinine Clearance: 81.7 mL/min (by C-G formula based on SCr of 0.73 mg/dL). Liver Function Tests:  Recent Labs Lab 01/31/17 1958  AST 145*  ALT 74*  ALKPHOS 56  BILITOT 0.8  PROT 6.5  ALBUMIN 2.9*   No results for input(s): LIPASE, AMYLASE in the last 168 hours. No results for input(s): AMMONIA in the last 168 hours. Coagulation Profile: No results for input(s): INR, PROTIME in the last 168 hours. Cardiac Enzymes:  Recent Labs Lab 02/01/17 0105 02/02/17 0554 02/03/17 0521  CKTOTAL  3,175* 1,084* 1,122*   Thyroid Function Tests:  Recent Labs  02/01/17 0105  TSH 1.195   Anemia Panel: No results for input(s): VITAMINB12, FOLATE, FERRITIN, TIBC, IRON, RETICCTPCT in the last 72 hours. Sepsis Labs:  Recent Labs Lab 01/31/17 2010  LATICACIDVEN 1.23    Radiology Studies: No results found.   Scheduled Meds: . aspirin  81 mg Oral Daily  . donepezil  10 mg Oral QHS  . dutasteride  0.5 mg Oral Daily  . enoxaparin (LOVENOX) injection  40 mg Subcutaneous Q24H  . feeding supplement (ENSURE ENLIVE)  237 mL Oral BID BM  . lisinopril  10 mg Oral Daily  . memantine  10 mg Oral BID  . montelukast  10 mg Oral QHS  . multivitamin  15 mL Oral Daily  . omega-3 acid ethyl esters  1 g Oral Daily  . risperiDONE  1 mg Oral Daily  . sertraline  25 mg Oral Daily  . tamsulosin  0.4 mg Oral Daily   Continuous Infusions: . sodium chloride 75 mL/hr at 02/03/17 0933     LOS: 2 days    Latrelle Dodrill, MD Pager: Text Page via [email or web address]  [phone]  If 7PM-7AM, please contact night-coverage [email or web address] Password [email or web address] 02/03/2017, 1:34 PM

## 2017-02-03 NOTE — Progress Notes (Signed)
Rn requested CSW meet with pt wife to help answer questions.  CSW met with wife and provided reassurance about SNF transfer process.  Pt wife also asked about what she needs to bring to [facility] application appointment- CSW printed copy of Medicaid application and provided to wife which has list of required paperwork for patient to bring  No further needs at this time- plan for DC to SNF tomorrow  Jorge Ny, [place] Social Worker [phone]

## 2017-02-04 DIAGNOSIS — F339 Major depressive disorder, recurrent, unspecified: Secondary | ICD-10-CM | POA: Diagnosis not present

## 2017-02-04 DIAGNOSIS — R4182 Altered mental status, unspecified: Secondary | ICD-10-CM | POA: Diagnosis not present

## 2017-02-04 DIAGNOSIS — L89613 Pressure ulcer of right heel, stage 3: Secondary | ICD-10-CM | POA: Diagnosis not present

## 2017-02-04 DIAGNOSIS — L89109 Pressure ulcer of unspecified part of back, unspecified stage: Secondary | ICD-10-CM | POA: Diagnosis not present

## 2017-02-04 DIAGNOSIS — Z9181 History of falling: Secondary | ICD-10-CM | POA: Diagnosis not present

## 2017-02-04 DIAGNOSIS — R531 Weakness: Secondary | ICD-10-CM | POA: Diagnosis not present

## 2017-02-04 DIAGNOSIS — R5381 Other malaise: Secondary | ICD-10-CM | POA: Diagnosis not present

## 2017-02-04 DIAGNOSIS — Z6822 Body mass index (BMI) 22.0-22.9, adult: Secondary | ICD-10-CM | POA: Diagnosis not present

## 2017-02-04 DIAGNOSIS — R739 Hyperglycemia, unspecified: Secondary | ICD-10-CM | POA: Diagnosis present

## 2017-02-04 DIAGNOSIS — L89153 Pressure ulcer of sacral region, stage 3: Secondary | ICD-10-CM | POA: Diagnosis not present

## 2017-02-04 DIAGNOSIS — M25519 Pain in unspecified shoulder: Secondary | ICD-10-CM | POA: Diagnosis not present

## 2017-02-04 DIAGNOSIS — R402411 Glasgow coma scale score 13-15, in the field [EMT or ambulance]: Secondary | ICD-10-CM | POA: Diagnosis not present

## 2017-02-04 DIAGNOSIS — T796XXD Traumatic ischemia of muscle, subsequent encounter: Secondary | ICD-10-CM | POA: Diagnosis not present

## 2017-02-04 DIAGNOSIS — L89893 Pressure ulcer of other site, stage 3: Secondary | ICD-10-CM | POA: Diagnosis not present

## 2017-02-04 DIAGNOSIS — L89614 Pressure ulcer of right heel, stage 4: Secondary | ICD-10-CM | POA: Diagnosis not present

## 2017-02-04 DIAGNOSIS — F0280 Dementia in other diseases classified elsewhere without behavioral disturbance: Secondary | ICD-10-CM | POA: Diagnosis present

## 2017-02-04 DIAGNOSIS — F0390 Unspecified dementia without behavioral disturbance: Secondary | ICD-10-CM | POA: Diagnosis not present

## 2017-02-04 DIAGNOSIS — Z7189 Other specified counseling: Secondary | ICD-10-CM | POA: Diagnosis not present

## 2017-02-04 DIAGNOSIS — D649 Anemia, unspecified: Secondary | ICD-10-CM | POA: Diagnosis not present

## 2017-02-04 DIAGNOSIS — N289 Disorder of kidney and ureter, unspecified: Secondary | ICD-10-CM | POA: Diagnosis not present

## 2017-02-04 DIAGNOSIS — L89154 Pressure ulcer of sacral region, stage 4: Secondary | ICD-10-CM | POA: Diagnosis not present

## 2017-02-04 DIAGNOSIS — R195 Other fecal abnormalities: Secondary | ICD-10-CM | POA: Diagnosis not present

## 2017-02-04 DIAGNOSIS — Z7982 Long term (current) use of aspirin: Secondary | ICD-10-CM | POA: Diagnosis not present

## 2017-02-04 DIAGNOSIS — J45909 Unspecified asthma, uncomplicated: Secondary | ICD-10-CM | POA: Diagnosis present

## 2017-02-04 DIAGNOSIS — Z515 Encounter for palliative care: Secondary | ICD-10-CM | POA: Diagnosis not present

## 2017-02-04 DIAGNOSIS — L89224 Pressure ulcer of left hip, stage 4: Secondary | ICD-10-CM | POA: Diagnosis not present

## 2017-02-04 DIAGNOSIS — I10 Essential (primary) hypertension: Secondary | ICD-10-CM | POA: Diagnosis not present

## 2017-02-04 DIAGNOSIS — E46 Unspecified protein-calorie malnutrition: Secondary | ICD-10-CM | POA: Diagnosis present

## 2017-02-04 DIAGNOSIS — Z79899 Other long term (current) drug therapy: Secondary | ICD-10-CM | POA: Diagnosis not present

## 2017-02-04 DIAGNOSIS — G9341 Metabolic encephalopathy: Secondary | ICD-10-CM | POA: Diagnosis present

## 2017-02-04 DIAGNOSIS — M6281 Muscle weakness (generalized): Secondary | ICD-10-CM | POA: Diagnosis not present

## 2017-02-04 DIAGNOSIS — G309 Alzheimer's disease, unspecified: Secondary | ICD-10-CM | POA: Diagnosis present

## 2017-02-04 DIAGNOSIS — Y92129 Unspecified place in nursing home as the place of occurrence of the external cause: Secondary | ICD-10-CM | POA: Diagnosis not present

## 2017-02-04 DIAGNOSIS — Z87891 Personal history of nicotine dependence: Secondary | ICD-10-CM | POA: Diagnosis not present

## 2017-02-04 DIAGNOSIS — L89623 Pressure ulcer of left heel, stage 3: Secondary | ICD-10-CM | POA: Diagnosis present

## 2017-02-04 DIAGNOSIS — E860 Dehydration: Secondary | ICD-10-CM | POA: Diagnosis not present

## 2017-02-04 DIAGNOSIS — L8994 Pressure ulcer of unspecified site, stage 4: Secondary | ICD-10-CM | POA: Diagnosis not present

## 2017-02-04 DIAGNOSIS — D62 Acute posthemorrhagic anemia: Secondary | ICD-10-CM | POA: Diagnosis not present

## 2017-02-04 DIAGNOSIS — E785 Hyperlipidemia, unspecified: Secondary | ICD-10-CM | POA: Diagnosis present

## 2017-02-04 DIAGNOSIS — E870 Hyperosmolality and hypernatremia: Secondary | ICD-10-CM | POA: Diagnosis not present

## 2017-02-04 DIAGNOSIS — M6282 Rhabdomyolysis: Secondary | ICD-10-CM | POA: Diagnosis not present

## 2017-02-04 DIAGNOSIS — K922 Gastrointestinal hemorrhage, unspecified: Secondary | ICD-10-CM | POA: Diagnosis not present

## 2017-02-04 DIAGNOSIS — Z66 Do not resuscitate: Secondary | ICD-10-CM | POA: Diagnosis present

## 2017-02-04 DIAGNOSIS — R5383 Other fatigue: Secondary | ICD-10-CM | POA: Diagnosis not present

## 2017-02-04 DIAGNOSIS — M79673 Pain in unspecified foot: Secondary | ICD-10-CM | POA: Diagnosis not present

## 2017-02-04 DIAGNOSIS — M4628 Osteomyelitis of vertebra, sacral and sacrococcygeal region: Secondary | ICD-10-CM | POA: Diagnosis not present

## 2017-02-04 DIAGNOSIS — N400 Enlarged prostate without lower urinary tract symptoms: Secondary | ICD-10-CM | POA: Diagnosis present

## 2017-02-04 DIAGNOSIS — T39395A Adverse effect of other nonsteroidal anti-inflammatory drugs [NSAID], initial encounter: Secondary | ICD-10-CM | POA: Diagnosis present

## 2017-02-04 LAB — BASIC METABOLIC PANEL
ANION GAP: 9 (ref 5–15)
BUN: 16 mg/dL (ref 6–20)
CHLORIDE: 105 mmol/L (ref 101–111)
CO2: 23 mmol/L (ref 22–32)
Calcium: 7.7 mg/dL — ABNORMAL LOW (ref 8.9–10.3)
Creatinine, Ser: 0.68 mg/dL (ref 0.61–1.24)
GFR calc non Af Amer: >60 mL/min (ref >60)
Glucose, Bld: 98 mg/dL (ref 65–99)
Potassium: 4.1 mmol/L (ref 3.5–5.1)
Sodium: 137 mmol/L (ref 135–145)

## 2017-02-04 LAB — CK: CK TOTAL: 692 U/L — AB (ref 49–397)

## 2017-02-04 MED ORDER — ENSURE ENLIVE PO LIQD: 237.0000 mL | Freq: Two times a day (BID) | ORAL | 12 refills | Status: DC

## 2017-02-04 MED ORDER — COLLAGENASE 250 UNIT/GM EX OINT: 1.0000 "application " | TOPICAL_OINTMENT | Freq: Every day | CUTANEOUS | 0 refills | Status: DC

## 2017-02-04 MED ORDER — OXYCODONE-ACETAMINOPHEN 5-325 MG PO TABS: 1.0000 | ORAL_TABLET | Freq: Four times a day (QID) | ORAL | 0 refills | Status: DC | PRN

## 2017-02-04 NOTE — Progress Notes (Deleted)
Physician Discharge Summary  Andre Ball  ZOX:[id number]  DOB: 1941/07/05  DOA: 01/31/2017 PCP: Evlyn Courier, MD  Admit date: 01/31/2017 Discharge date: 02/04/2017  Admitted From: Home   Disposition:  SNF   Recommendations for Outpatient Follow-up:  1. Follow up with PCP in 1-2 weeks 2. Please obtain BMP/CBC in one week monitor Cr and Hgb   Discharge Condition: Stable  CODE STATUS: FULL  Diet recommendation: Heart Healthy  Brief/Interim Summary: 76 year old male with history of advanced dementia was to the ED because of weakness and frequent fall. Per family patient has been declining over the past 2 months for appetite and recurrent falls getting out of the bed. He was admitted for dehydration with hypernatremia,found to have CK elevated and getting IV fluids.  Subjective: Patient seen and examined, mild c/o pain in the bottom of the foot and shoulder pain. Patient have full ROM of all extremities. Pain medication alleviates the pain. Patient with good UOP, tolerating oral intake well. No acute events overnight    Discharge Diagnoses/Hospital Course:  Rhabdomyolysis due to frequent falls - CK improving  Patient treated with aggressive IVF and oral hydration  Encourage oral hydration   Shoulder/Hip/Feet pain - likely due to fall, from natural deconditioning of dementia  Feet pain could be secondary to neuropathy as patient c/o burning sensation, and pain is the bottom of the foot - will add Trial of neurontin No xray needed patient have full range of motion  Percocet when necessary  Dementia - declining  Continue Aricept  Continue risperidone  Continue Nemenda  Follow up with neurology as outpatient   Hypertension - stable during hospital stay  Continue home medications  Stage 2 pressure injury of R foot POA  Stage 3 pressure injury of sacrum POA  Apply Aquacel, foam dressing and float to decrease pressure  Frequent turns Apply santyl at the sacrum daily    Protein calorie malnutrition  Following dietitian recommendations  Chronic normocytic anemia - Initial drop in Hgb due to dilution, now stable  No overt bleeding  All other chronic medical condition were stable during the hospitalization.  Patient was seen by physical therapy, recommending SNF  On the day of the discharge the patient's vitals were stable, and no other acute medical condition were reported by patient. Patient was felt safe to be discharge to home   Discharge Instructions  You were cared for by a hospitalist during your hospital stay. If you have any questions about your discharge medications or the care you received while you were in the hospital after you are discharged, you can call the unit and asked to speak with the hospitalist on call if the hospitalist that took care of you is not available. Once you are discharged, your primary care physician will handle any further medical issues. Please note that NO REFILLS for any discharge medications will be authorized once you are discharged, as it is imperative that you return to your primary care physician (or establish a relationship with a primary care physician if you do not have one) for your aftercare needs so that they can reassess your need for medications and monitor your lab values.  Discharge Instructions    Call MD for:  difficulty breathing, headache or visual disturbances    Complete by:  As directed    Call MD for:  extreme fatigue    Complete by:  As directed    Call MD for:  hives    Complete by:  As directed  Call MD for:  persistant dizziness or light-headedness    Complete by:  As directed    Call MD for:  persistant nausea and vomiting    Complete by:  As directed    Call MD for:  redness, tenderness, or signs of infection (pain, swelling, redness, odor or green/yellow discharge around incision site)    Complete by:  As directed    Call MD for:  severe uncontrolled pain    Complete by:  As  directed    Call MD for:  temperature >100.4    Complete by:  As directed    Diet general    Complete by:  As directed    Increase activity slowly    Complete by:  As directed      Allergies as of 02/04/2017   No Known Allergies     Medication List    TAKE these medications   aspirin 81 MG tablet Take 81 mg by mouth daily.   collagenase ointment Commonly known as:  SANTYL Apply 1 application topically daily.   diclofenac sodium 1 % Gel Commonly known as:  VOLTAREN Apply 1 application topically as needed (for pain).   donepezil 10 MG tablet Commonly known as:  ARICEPT Take 1 tablet (10 mg total) by mouth at bedtime.   dutasteride 0.5 MG capsule Commonly known as:  AVODART Take 0.5 mg by mouth daily.   feeding supplement (ENSURE ENLIVE) Liqd Take 237 mLs by mouth 2 (two) times daily between meals.   FISH OIL PO Take 1 capsule by mouth daily.   fluticasone 50 MCG/ACT nasal spray Commonly known as:  FLONASE Place 2 sprays into the nose daily. What changed:  when to take this  reasons to take this   lisinopril 10 MG tablet Commonly known as:  PRINIVIL,ZESTRIL Take 10 mg by mouth daily.   memantine 10 MG tablet Commonly known as:  NAMENDA Take 1 tablet (10 mg total) by mouth 2 (two) times daily.   montelukast 10 MG tablet Commonly known as:  SINGULAIR Take 10 mg by mouth at bedtime.   MULTIVITAMIN PO Take 1 tablet by mouth daily.   NITROSTAT 0.4 MG SL tablet Generic drug:  nitroGLYCERIN Place 0.4 mg under the tongue every 5 (five) minutes as needed for chest pain.   oxaprozin 600 MG tablet Commonly known as:  DAYPRO Take 1 tablet (600 mg total) by mouth 2 (two) times daily.   oxyCODONE-acetaminophen 5-325 MG tablet Commonly known as:  PERCOCET/ROXICET Take 1 tablet by mouth every 6 (six) hours as needed for moderate pain.   risperiDONE 1 MG tablet Commonly known as:  RISPERDAL Take 1 tablet (1 mg total) by mouth 2 (two) times daily. What  changed:  when to take this   sertraline 25 MG tablet Commonly known as:  ZOLOFT Take 25 mg by mouth daily.   tamsulosin 0.4 MG Caps capsule Commonly known as:  FLOMAX Take 0.4 mg by mouth daily.   VITAMIN B 12 PO Take 1 tablet by mouth daily.      Contact information for after-discharge care    Destination    [facility] .   Specialty:  Skilled Nursing Facility Contact information: [address] [place] [address] [phone]             No Known Allergies  Consultations:  None    Procedures/Studies: Dg Chest 2 View  Result Date: 01/16/2017 CLINICAL DATA:  Initial evaluation for acute weakness. EXAM: CHEST  2 VIEW COMPARISON:  Prior radiograph from 12/12/2016. FINDINGS: Stable cardiomegaly. Mediastinal silhouette within normal limits. Aortic atherosclerosis noted. Lungs hypoinflated. Linear opacity at the left lung base most compatible with atelectasis. No other focal infiltrates. No pulmonary edema or pleural effusion. No pneumothorax. No acute osseus abnormality. IMPRESSION: 1. Shallow lung inflation with mild left basilar subsegmental atelectasis. 2. No other active cardiopulmonary disease. 3. Stable cardiomegaly. Electronically Signed   By: Rise Mu M.D.   On: 01/16/2017 22:53   Dg Pelvis 1-2 Views  Result Date: 01/31/2017 CLINICAL DATA:  Fall with buttock pain EXAM: PELVIS - 1-2 VIEW COMPARISON:  None. FINDINGS: SI joints are symmetric bilaterally. The pubic symphysis appears intact. Both femoral heads appear normally positioned. IMPRESSION: No definite acute osseous abnormality Electronically Signed   By: Jasmine Pang M.D.   On: 01/31/2017 23:42   Ct Head Wo Contrast  Result Date: 01/31/2017 CLINICAL DATA:  Larey Seat last night.  Ecchymosis. EXAM: CT HEAD WITHOUT CONTRAST CT CERVICAL SPINE WITHOUT CONTRAST TECHNIQUE: Multidetector CT imaging of the head and cervical spine was performed following the standard protocol  without intravenous contrast. Multiplanar CT image reconstructions of the cervical spine were also generated. COMPARISON:  01/16/2017 FINDINGS: CT HEAD FINDINGS Brain: There is no intracranial hemorrhage, mass or evidence of acute infarction. There is moderate generalized atrophy. There is moderate chronic microvascular ischemic change. There is no significant extra-axial fluid collection. No acute intracranial findings are evident. Vascular: No hyperdense vessel or unexpected calcification. Skull: Normal. Negative for fracture or focal lesion. Sinuses/Orbits: No acute finding. Other: None. CT CERVICAL SPINE FINDINGS Alignment: Normal. Skull base and vertebrae: No acute fracture. No primary bone lesion or focal pathologic process. Soft tissues and spinal canal: No prevertebral fluid or swelling. No visible canal hematoma. Disc levels: Moderate cervical degenerative disc changes at C5-6. Facet articulations are arthritic but intact. Upper chest: Negative. Other: None IMPRESSION: 1. No acute intracranial findings. There is moderate generalized atrophy and chronic appearing white matter hypodensities which likely represent small vessel ischemic disease. 2. Negative for acute cervical spine fracture. Electronically Signed   By: Ellery Plunk M.D.   On: 01/31/2017 23:44   Ct Head Wo Contrast  Result Date: 01/16/2017 CLINICAL DATA:  Left-sided weakness now resolved EXAM: CT HEAD WITHOUT CONTRAST TECHNIQUE: Contiguous axial images were obtained from the base of the skull through the vertex without intravenous contrast. COMPARISON:  12/12/2016, MRI 12/02/2014 FINDINGS: Brain: No acute territorial infarction, hemorrhage or focal mass lesion is visualized. Moderate periventricular white matter small vessel ischemic changes. Mild atrophy. Stable ventricle size. Vascular: No hyperdense vessels. Scattered calcifications at the carotid siphons. Skull: Normal. Negative for fracture or focal lesion. Sinuses/Orbits: Mucosal  thickening in the ethmoid and maxillary sinuses. No acute orbital abnormality. Other: None IMPRESSION: 1. No CT evidence for acute intracranial abnormality. MRI follow-up as clinically indicated 2. Moderate periventricular small vessel ischemic changes, unchanged. Atrophy. Electronically Signed   By: Jasmine Pang M.D.   On: 01/16/2017 23:19   Ct Cervical Spine Wo Contrast  Result Date: 01/31/2017 CLINICAL DATA:  Larey Seat last night.  Ecchymosis. EXAM: CT HEAD WITHOUT CONTRAST CT CERVICAL SPINE WITHOUT CONTRAST TECHNIQUE: Multidetector CT imaging of the head and cervical spine was performed following the standard protocol without intravenous contrast. Multiplanar CT image reconstructions of the cervical spine were also generated. COMPARISON:  01/16/2017 FINDINGS: CT HEAD FINDINGS Brain: There is no intracranial hemorrhage, mass or evidence of acute infarction. There is moderate generalized atrophy. There is moderate chronic microvascular ischemic change.  There is no significant extra-axial fluid collection. No acute intracranial findings are evident. Vascular: No hyperdense vessel or unexpected calcification. Skull: Normal. Negative for fracture or focal lesion. Sinuses/Orbits: No acute finding. Other: None. CT CERVICAL SPINE FINDINGS Alignment: Normal. Skull base and vertebrae: No acute fracture. No primary bone lesion or focal pathologic process. Soft tissues and spinal canal: No prevertebral fluid or swelling. No visible canal hematoma. Disc levels: Moderate cervical degenerative disc changes at C5-6. Facet articulations are arthritic but intact. Upper chest: Negative. Other: None IMPRESSION: 1. No acute intracranial findings. There is moderate generalized atrophy and chronic appearing white matter hypodensities which likely represent small vessel ischemic disease. 2. Negative for acute cervical spine fracture. Electronically Signed   By: Ellery Plunk M.D.   On: 01/31/2017 23:44   US Abdomen  Complete  Result Date: 02/01/2017 CLINICAL DATA:  Abnormal LFTs EXAM: ABDOMEN ULTRASOUND COMPLETE COMPARISON:  Ultrasound 01/08/2005 FINDINGS: Gallbladder: No gallstones or wall thickening visualized. No sonographic Murphy sign noted by sonographer. Common bile duct: Diameter: 4 mm in diameter within normal limits Liver: No focal lesion identified. Within normal limits in parenchymal echogenicity. IVC: No abnormality visualized. Pancreas: Visualized portion unremarkable. Spleen: Size and appearance within normal limits. Measures 4 cm length Right Kidney: Length: 10.4 cm. Echogenicity within normal limits. No mass or hydronephrosis visualized. Left Kidney: Length: 10.4 cm. Echogenicity within normal limits. No hydronephrosis. There is a left renal cyst measures 9 x 9 mm. Abdominal aorta: No aneurysm visualized. Measures up to 2.2 cm in diameter Other findings: None. IMPRESSION: 1. No gallstones are noted within gallbladder.  Normal CBD. 2. No focal hepatic mass.  Normal liver echogenicity. 3. No hydronephrosis. No renal calculi. Left renal cyst measures 9 mm. 4. No aortic aneurysm. Electronically Signed   By: Natasha Mead M.D.   On: 02/01/2017 10:40   Dg Chest Port 1 View  Result Date: 01/31/2017 CLINICAL DATA:  Possible fall last evening.  Altered mental status. EXAM: PORTABLE CHEST 1 VIEW COMPARISON:  01/16/2017 CXR FINDINGS: The heart size and mediastinal contours are within normal limits. Atherosclerosis at the aortic arch without aneurysm. Both lungs are clear. The visualized skeletal structures are unremarkable. IMPRESSION: No active disease. Electronically Signed   By: Tollie Eth M.D.   On: 01/31/2017 20:41     Discharge Exam: Vitals:   02/03/17 2045 02/04/17 0535  BP: (!) 121/46 (!) 116/48  Pulse: (!) 59 (!) 59  Resp: 18 16  Temp: 98.1 F (36.7 C) 97.7 F (36.5 C)   Vitals:   02/03/17 0540 02/03/17 1329 02/03/17 2045 02/04/17 0535  BP: (!) 126/47 (!) 129/58 (!) 121/46 (!) 116/48  Pulse:  (!) 57 63 (!) 59 (!) 59  Resp: Temp: 98.1 F (36.7 C) 98.8 F (37.1 C) 98.1 F (36.7 C) 97.7 F (36.5 C)  TempSrc: Axillary Oral Oral Oral  SpO2: 100% 100% 100% 100%  Weight: 71.3 kg (157 lb 3 oz)   72.1 kg (158 lb 15.2 oz)  Height:        General: Pt is alert, awake, not in acute distress Cardiovascular: RRR, S1/S2 +, no rubs, no gallops Respiratory: CTA bilaterally, no wheezing, no rhonchi Abdominal: Soft, NT, ND, bowel sounds + Skin: Stage 3 pressure ulcer in the sacrum base red with some yellow layer on the top.    The results of significant diagnostics from this hospitalization (including imaging, microbiology, ancillary and laboratory) are listed below for reference.     Microbiology: No  results found for this or any previous visit (from the past 240 hour(s)).   Labs: BNP (last 3 results) No results for input(s): BNP in the last 8760 hours. Basic Metabolic Panel:  Recent Labs Lab 01/31/17 1958 02/01/17 0105 02/01/17 0543 02/02/17 0554 02/03/17 0521 02/04/17 0454  NA 149*  --  147* 141 140 137  K 3.6  --  3.3* 3.9 3.6 4.1  CL 112*  --  111 106 105 105  CO2 32  --  GLUCOSE 103*  --  116* 109* 92 98  BUN 33*  --  30* 26* 23* 16  CREATININE 0.99 0.96 0.86 0.87 0.73 0.68  CALCIUM 8.6*  --  8.1* 8.0* 8.2* 7.7*   Liver Function Tests:  Recent Labs Lab 01/31/17 1958  AST 145*  ALT 74*  ALKPHOS 56  BILITOT 0.8  PROT 6.5  ALBUMIN 2.9*   No results for input(s): LIPASE, AMYLASE in the last 168 hours. No results for input(s): AMMONIA in the last 168 hours. CBC:  Recent Labs Lab 01/31/17 1958 02/01/17 0001 02/01/17 0543 02/02/17 0554 02/03/17 0521  WBC 8.5 7.6 6.9 6.6 6.1  NEUTROABS 7.0  --   --  4.7  --   HGB 11.9* 12.1* 10.8* 10.6* 11.6*  HCT 36.7* 36.1* 33.2* 32.2* 35.3*  MCV 99.2 96.5 96.2 98.2 97.2  PLT 257 230 237 246 270   Cardiac Enzymes:  Recent Labs Lab 02/01/17 0105 02/02/17 0554 02/03/17 0521  02/03/17 1552 02/04/17 0454  CKTOTAL 3,175* 1,084* 1,122* 822* 692*   Urinalysis    Component Value Date/Time   COLORURINE YELLOW 01/31/2017 1944   APPEARANCEUR CLOUDY (A) 01/31/2017 1944   LABSPEC 1.030 01/31/2017 1944   PHURINE 5.0 01/31/2017 1944   GLUCOSEU NEGATIVE 01/31/2017 1944   HGBUR NEGATIVE 01/31/2017 1944   BILIRUBINUR NEGATIVE 01/31/2017 1944   KETONESUR NEGATIVE 01/31/2017 1944   PROTEINUR NEGATIVE 01/31/2017 1944   NITRITE NEGATIVE 01/31/2017 1944   LEUKOCYTESUR NEGATIVE 01/31/2017 1944   Sepsis Labs Invalid input(s): PROCALCITONIN,  WBC,  LACTICIDVEN Microbiology No results found for this or any previous visit (from the past 240 hour(s)).   Time coordinating discharge: 40 minutes  SIGNED:  Latrelle Dodrill, MD  [facility] 02/04/2017, 11:28 AM  Pager please text page via  [email or web address] Password [email or web address]

## 2017-02-04 NOTE — Progress Notes (Signed)
Report called to Kennyth Arnold, Charity fundraiser at  [facility].   All questions answered.  VSS.  Pt wheeled out by [facility].

## 2017-02-04 NOTE — Clinical Social Work Placement (Signed)
Patient received and accepted bed offer at [facility]. [facility] contacted, family aware. Patient's RN can call report to [phone], patient going to Room 103.   CLINICAL SOCIAL WORK PLACEMENT  NOTE  Date:  02/04/2017  Patient Details  Name: Andre Ball MRN: [id number] Date of Birth: November 19, 1940  Clinical Social Work is seeking post-discharge placement for this patient at the Skilled  Nursing Facility level of care (*CSW will initial, date and re-position this form in  chart as items are completed):  Yes   Patient/family provided with [facility] Clinical Social Work Department's list of facilities offering this level of care within the geographic area requested by the patient (or if unable, by the patient's family).  Yes   Patient/family informed of their freedom to choose among providers that offer the needed level of care, that participate in Medicare, Medicaid or managed care program needed by the patient, have an available bed and are willing to accept the patient.  Yes   Patient/family informed of [facility]'s ownership interest in [facility] and [facility], as well as of the fact that they are under no obligation to receive care at these facilities.  PASRR submitted to EDS on 02/01/17     PASRR number received on 02/01/17     Existing PASRR number confirmed on       FL2 transmitted to all facilities in geographic area requested by pt/family on 02/01/17     FL2 transmitted to all facilities within larger geographic area on       Patient informed that his/her managed care company has contracts with or will negotiate with certain facilities, including the following:        Yes   Patient/family informed of bed offers received.  Patient chooses bed at [facility]     Physician recommends and patient chooses bed at      Patient to be transferred to [facility] on 02/04/17.  Patient to be transferred to facility by [facility]     Patient family  notified on 02/04/17 of transfer.  Name of family member notified:  [name]     PHYSICIAN       Additional Comment:    _______________________________________________ Antionette Poles, LCSW 02/04/2017, 1:07 PM

## 2017-02-04 NOTE — Progress Notes (Signed)
CSW spoke with patient's wife and confirmed selected SNF - [facility]. Patient's wife wants patient to be transported via [facility]. CSW confirmed with staff at [facility] that patient has a bed at the facility. CSW will continue to follow and assist with dc planning.   Celso Sickle, [place] Clinical Social Worker [facility] Cell#: [phone]

## 2017-02-04 NOTE — Discharge Summary (Signed)
Physician Discharge Summary  Andre Ball  ZOX:[id number]  DOB: 1941/04/21  DOA: 01/31/2017 PCP: Evlyn Courier, MD  Admit date: 01/31/2017 Discharge date: 02/04/2017  Admitted From: Home           Disposition:  SNF   Recommendations for Outpatient Follow-up:  1. Follow up with PCP in 1-2 weeks 2. Please obtain BMP/CBC in one week monitor Cr and Hgb   Discharge Condition: Stable  CODE STATUS: FULL  Diet recommendation: Heart Healthy  Brief/Interim Summary: 76 year old male with history of advanced dementia was to the ED because of weakness and frequent fall. Per family patient has been declining over the past 2 months for appetite and recurrent falls getting out of the bed. He was admitted for dehydration with hypernatremia,found to have CK elevated and getting IV fluids.  Subjective: Patient seen and examined, mild c/o pain in the bottom of the foot and shoulder pain. Patient have full ROM of all extremities. Pain medication alleviates the pain. Patient with good UOP, tolerating oral intake well. No acute events overnight    Discharge Diagnoses/Hospital Course:  Rhabdomyolysis due to frequent falls - CK improving  Patient treated with aggressive IVF and oral hydration  Encourage oral hydration   Shoulder/Hip/Feet pain - likely due to fall, from natural deconditioning of dementia  Feet pain could be secondary to neuropathy as patient c/o burning sensation, and pain is the bottom of the foot - will add Trial of neurontin No xray needed patient have full range of motion  Percocet when necessary  Dementia - declining  Continue Aricept  Continue risperidone  Continue Nemenda  Follow up with neurology as outpatient   Hypertension - stable during hospital stay  Continue home medications  Stage 2 pressure injury of R foot POA  Stage 3 pressure injury of sacrum POA  Apply Aquacel, foam dressing and float to decrease pressure  Frequent turns Apply santyl at the sacrum  daily   Protein calorie malnutrition  Following dietitian recommendations  Chronic normocytic anemia- Initial drop in Hgb due to dilution, now stable  No overt bleeding  All other chronic medical condition were stable during the hospitalization.  Patient was seen by physical therapy, recommending SNF  On the day of the discharge the patient's vitals were stable, and no other acute medical condition were reported by patient. Patient was felt safe to be discharge to home   Discharge Instructions  You were cared for by a hospitalist during your hospital stay. If you have any questions about your discharge medications or the care you received while you were in the hospital after you are discharged, you can call the unit and asked to speak with the hospitalist on call if the hospitalist that took care of you is not available. Once you are discharged, your primary care physician will handle any further medical issues. Please note that NO REFILLS for any discharge medications will be authorized once you are discharged, as it is imperative that you return to your primary care physician (or establish a relationship with a primary care physician if you do not have one) for your aftercare needs so that they can reassess your need for medications and monitor your lab values.      Discharge Instructions    Call MD for:  difficulty breathing, headache or visual disturbances    Complete by:  As directed    Call MD for:  extreme fatigue    Complete by:  As directed    Call MD  for:  hives    Complete by:  As directed    Call MD for:  persistant dizziness or light-headedness    Complete by:  As directed    Call MD for:  persistant nausea and vomiting    Complete by:  As directed    Call MD for:  redness, tenderness, or signs of infection (pain, swelling, redness, odor or green/yellow discharge around incision site)    Complete by:  As directed    Call MD for:  severe uncontrolled pain     Complete by:  As directed    Call MD for:  temperature >100.4    Complete by:  As directed    Diet general    Complete by:  As directed    Increase activity slowly    Complete by:  As directed      Allergies as of 02/04/2017   No Known Allergies        Medication List    TAKE these medications   aspirin 81 MG tablet Take 81 mg by mouth daily.   collagenase ointment Commonly known as:  SANTYL Apply 1 application topically daily.   diclofenac sodium 1 % Gel Commonly known as:  VOLTAREN Apply 1 application topically as needed (for pain).   donepezil 10 MG tablet Commonly known as:  ARICEPT Take 1 tablet (10 mg total) by mouth at bedtime.   dutasteride 0.5 MG capsule Commonly known as:  AVODART Take 0.5 mg by mouth daily.   feeding supplement (ENSURE ENLIVE) Liqd Take 237 mLs by mouth 2 (two) times daily between meals.   FISH OIL PO Take 1 capsule by mouth daily.   fluticasone 50 MCG/ACT nasal spray Commonly known as:  FLONASE Place 2 sprays into the nose daily. What changed:  when to take this  reasons to take this   lisinopril 10 MG tablet Commonly known as:  PRINIVIL,ZESTRIL Take 10 mg by mouth daily.   memantine 10 MG tablet Commonly known as:  NAMENDA Take 1 tablet (10 mg total) by mouth 2 (two) times daily.   montelukast 10 MG tablet Commonly known as:  SINGULAIR Take 10 mg by mouth at bedtime.   MULTIVITAMIN PO Take 1 tablet by mouth daily.   NITROSTAT 0.4 MG SL tablet Generic drug:  nitroGLYCERIN Place 0.4 mg under the tongue every 5 (five) minutes as needed for chest pain.   oxaprozin 600 MG tablet Commonly known as:  DAYPRO Take 1 tablet (600 mg total) by mouth 2 (two) times daily.   oxyCODONE-acetaminophen 5-325 MG tablet Commonly known as:  PERCOCET/ROXICET Take 1 tablet by mouth every 6 (six) hours as needed for moderate pain.   risperiDONE 1 MG tablet Commonly known as:  RISPERDAL Take 1 tablet (1  mg total) by mouth 2 (two) times daily. What changed:  when to take this   sertraline 25 MG tablet Commonly known as:  ZOLOFT Take 25 mg by mouth daily.   tamsulosin 0.4 MG Caps capsule Commonly known as:  FLOMAX Take 0.4 mg by mouth daily.   VITAMIN B 12 PO Take 1 tablet by mouth daily.         Contact information for after-discharge care        Destination        [facility] .   Specialty:  Skilled Nursing Facility Contact information: [address] [place] [address] [phone]              No  Known Allergies  Consultations:  None    Procedures/Studies: Imaging Results  Dg Chest 2 View  Result Date: 01/16/2017 CLINICAL DATA:  Initial evaluation for acute weakness. EXAM: CHEST  2 VIEW COMPARISON:  Prior radiograph from 12/12/2016. FINDINGS: Stable cardiomegaly. Mediastinal silhouette within normal limits. Aortic atherosclerosis noted. Lungs hypoinflated. Linear opacity at the left lung base most compatible with atelectasis. No other focal infiltrates. No pulmonary edema or pleural effusion. No pneumothorax. No acute osseus abnormality. IMPRESSION: 1. Shallow lung inflation with mild left basilar subsegmental atelectasis. 2. No other active cardiopulmonary disease. 3. Stable cardiomegaly. Electronically Signed   By: Rise Mu M.D.   On: 01/16/2017 22:53   Dg Pelvis 1-2 Views  Result Date: 01/31/2017 CLINICAL DATA:  Fall with buttock pain EXAM: PELVIS - 1-2 VIEW COMPARISON:  None. FINDINGS: SI joints are symmetric bilaterally. The pubic symphysis appears intact. Both femoral heads appear normally positioned. IMPRESSION: No definite acute osseous abnormality Electronically Signed   By: Jasmine Pang M.D.   On: 01/31/2017 23:42   Ct Head Wo Contrast  Result Date: 01/31/2017 CLINICAL DATA:  Larey Seat last night.  Ecchymosis. EXAM: CT HEAD WITHOUT CONTRAST CT CERVICAL SPINE WITHOUT CONTRAST TECHNIQUE:  Multidetector CT imaging of the head and cervical spine was performed following the standard protocol without intravenous contrast. Multiplanar CT image reconstructions of the cervical spine were also generated. COMPARISON:  01/16/2017 FINDINGS: CT HEAD FINDINGS Brain: There is no intracranial hemorrhage, mass or evidence of acute infarction. There is moderate generalized atrophy. There is moderate chronic microvascular ischemic change. There is no significant extra-axial fluid collection. No acute intracranial findings are evident. Vascular: No hyperdense vessel or unexpected calcification. Skull: Normal. Negative for fracture or focal lesion. Sinuses/Orbits: No acute finding. Other: None. CT CERVICAL SPINE FINDINGS Alignment: Normal. Skull base and vertebrae: No acute fracture. No primary bone lesion or focal pathologic process. Soft tissues and spinal canal: No prevertebral fluid or swelling. No visible canal hematoma. Disc levels: Moderate cervical degenerative disc changes at C5-6. Facet articulations are arthritic but intact. Upper chest: Negative. Other: None IMPRESSION: 1. No acute intracranial findings. There is moderate generalized atrophy and chronic appearing white matter hypodensities which likely represent small vessel ischemic disease. 2. Negative for acute cervical spine fracture. Electronically Signed   By: Ellery Plunk M.D.   On: 01/31/2017 23:44   Ct Head Wo Contrast  Result Date: 01/16/2017 CLINICAL DATA:  Left-sided weakness now resolved EXAM: CT HEAD WITHOUT CONTRAST TECHNIQUE: Contiguous axial images were obtained from the base of the skull through the vertex without intravenous contrast. COMPARISON:  12/12/2016, MRI 12/02/2014 FINDINGS: Brain: No acute territorial infarction, hemorrhage or focal mass lesion is visualized. Moderate periventricular white matter small vessel ischemic changes. Mild atrophy. Stable ventricle size. Vascular: No hyperdense vessels. Scattered  calcifications at the carotid siphons. Skull: Normal. Negative for fracture or focal lesion. Sinuses/Orbits: Mucosal thickening in the ethmoid and maxillary sinuses. No acute orbital abnormality. Other: None IMPRESSION: 1. No CT evidence for acute intracranial abnormality. MRI follow-up as clinically indicated 2. Moderate periventricular small vessel ischemic changes, unchanged. Atrophy. Electronically Signed   By: Jasmine Pang M.D.   On: 01/16/2017 23:19   Ct Cervical Spine Wo Contrast  Result Date: 01/31/2017 CLINICAL DATA:  Larey Seat last night.  Ecchymosis. EXAM: CT HEAD WITHOUT CONTRAST CT CERVICAL SPINE WITHOUT CONTRAST TECHNIQUE: Multidetector CT imaging of the head and cervical spine was performed following the standard protocol without intravenous contrast. Multiplanar CT image reconstructions of the cervical spine were also  generated. COMPARISON:  01/16/2017 FINDINGS: CT HEAD FINDINGS Brain: There is no intracranial hemorrhage, mass or evidence of acute infarction. There is moderate generalized atrophy. There is moderate chronic microvascular ischemic change. There is no significant extra-axial fluid collection. No acute intracranial findings are evident. Vascular: No hyperdense vessel or unexpected calcification. Skull: Normal. Negative for fracture or focal lesion. Sinuses/Orbits: No acute finding. Other: None. CT CERVICAL SPINE FINDINGS Alignment: Normal. Skull base and vertebrae: No acute fracture. No primary bone lesion or focal pathologic process. Soft tissues and spinal canal: No prevertebral fluid or swelling. No visible canal hematoma. Disc levels: Moderate cervical degenerative disc changes at C5-6. Facet articulations are arthritic but intact. Upper chest: Negative. Other: None IMPRESSION: 1. No acute intracranial findings. There is moderate generalized atrophy and chronic appearing white matter hypodensities which likely represent small vessel ischemic disease. 2. Negative for acute cervical  spine fracture. Electronically Signed   By: Ellery Plunk M.D.   On: 01/31/2017 23:44   US Abdomen Complete  Result Date: 02/01/2017 CLINICAL DATA:  Abnormal LFTs EXAM: ABDOMEN ULTRASOUND COMPLETE COMPARISON:  Ultrasound 01/08/2005 FINDINGS: Gallbladder: No gallstones or wall thickening visualized. No sonographic Murphy sign noted by sonographer. Common bile duct: Diameter: 4 mm in diameter within normal limits Liver: No focal lesion identified. Within normal limits in parenchymal echogenicity. IVC: No abnormality visualized. Pancreas: Visualized portion unremarkable. Spleen: Size and appearance within normal limits. Measures 4 cm length Right Kidney: Length: 10.4 cm. Echogenicity within normal limits. No mass or hydronephrosis visualized. Left Kidney: Length: 10.4 cm. Echogenicity within normal limits. No hydronephrosis. There is a left renal cyst measures 9 x 9 mm. Abdominal aorta: No aneurysm visualized. Measures up to 2.2 cm in diameter Other findings: None. IMPRESSION: 1. No gallstones are noted within gallbladder.  Normal CBD. 2. No focal hepatic mass.  Normal liver echogenicity. 3. No hydronephrosis. No renal calculi. Left renal cyst measures 9 mm. 4. No aortic aneurysm. Electronically Signed   By: Natasha Mead M.D.   On: 02/01/2017 10:40   Dg Chest Port 1 View  Result Date: 01/31/2017 CLINICAL DATA:  Possible fall last evening.  Altered mental status. EXAM: PORTABLE CHEST 1 VIEW COMPARISON:  01/16/2017 CXR FINDINGS: The heart size and mediastinal contours are within normal limits. Atherosclerosis at the aortic arch without aneurysm. Both lungs are clear. The visualized skeletal structures are unremarkable. IMPRESSION: No active disease. Electronically Signed   By: Tollie Eth M.D.   On: 01/31/2017 20:41       Discharge Exam:     Vitals:   02/03/17 2045 02/04/17 0535  BP: (!) 121/46 (!) 116/48  Pulse: (!) 59 (!) 59  Resp: 18 16  Temp: 98.1 F (36.7 C) 97.7 F (36.5 C)          Vitals:   02/03/17 0540 02/03/17 1329 02/03/17 2045 02/04/17 0535  BP: (!) 126/47 (!) 129/58 (!) 121/46 (!) 116/48  Pulse: (!) 57 63 (!) 59 (!) 59  Resp: 18 18 18 16   Temp: 98.1 F (36.7 C) 98.8 F (37.1 C) 98.1 F (36.7 C) 97.7 F (36.5 C)  TempSrc: Axillary Oral Oral Oral  SpO2: 100% 100% 100% 100%  Weight: 71.3 kg (157 lb 3 oz)   72.1 kg (158 lb 15.2 oz)  Height:        General: Pt is alert, awake, not in acute distress Cardiovascular: RRR, S1/S2 +, no rubs, no gallops Respiratory: CTA bilaterally, no wheezing, no rhonchi Abdominal: Soft, NT, ND, bowel sounds + Skin:  Stage 3 pressure ulcer in the sacrum base red with some yellow layer on the top.     The results of significant diagnostics from this hospitalization (including imaging, microbiology, ancillary and laboratory) are listed below for reference.     Microbiology: No results found for this or any previous visit (from the past 240 hour(s)).   Labs: BNP (last 3 results) Recent Labs (within last 365 days)  No results for input(s): BNP in the last 8760 hours.   Basic Metabolic Panel:  Last Labs    Recent Labs Lab 01/31/17 1958 02/01/17 0105 02/01/17 0543 02/02/17 0554 02/03/17 0521 02/04/17 0454  NA 149*  --  147* 141 140 137  K 3.6  --  3.3* 3.9 3.6 4.1  CL 112*  --  111 106 105 105  CO2 32  --  GLUCOSE 103*  --  116* 109* 92 98  BUN 33*  --  30* 26* 23* 16  CREATININE 0.99 0.96 0.86 0.87 0.73 0.68  CALCIUM 8.6*  --  8.1* 8.0* 8.2* 7.7*     Liver Function Tests:  Last Labs    Recent Labs Lab 01/31/17 1958  AST 145*  ALT 74*  ALKPHOS 56  BILITOT 0.8  PROT 6.5  ALBUMIN 2.9*     Last Labs   No results for input(s): LIPASE, AMYLASE in the last 168 hours.   Last Labs   No results for input(s): AMMONIA in the last 168 hours.   CBC:  Last Labs    Recent Labs Lab 01/31/17 1958 02/01/17 0001 02/01/17 0543 02/02/17 0554 02/03/17 0521  WBC 8.5 7.6  6.9 6.6 6.1  NEUTROABS 7.0  --   --  4.7  --   HGB 11.9* 12.1* 10.8* 10.6* 11.6*  HCT 36.7* 36.1* 33.2* 32.2* 35.3*  MCV 99.2 96.5 96.2 98.2 97.2  PLT 257 230 237 246 270     Cardiac Enzymes:  Last Labs    Recent Labs Lab 02/01/17 0105 02/02/17 0554 02/03/17 0521 02/03/17 1552 02/04/17 0454  CKTOTAL 3,175* 1,084* 1,122* 822* 692*     Urinalysis Labs (Brief)          Component Value Date/Time   COLORURINE YELLOW 01/31/2017 1944   APPEARANCEUR CLOUDY (A) 01/31/2017 1944   LABSPEC 1.030 01/31/2017 1944   PHURINE 5.0 01/31/2017 1944   GLUCOSEU NEGATIVE 01/31/2017 1944   HGBUR NEGATIVE 01/31/2017 1944   BILIRUBINUR NEGATIVE 01/31/2017 1944   KETONESUR NEGATIVE 01/31/2017 1944   PROTEINUR NEGATIVE 01/31/2017 1944   NITRITE NEGATIVE 01/31/2017 1944   LEUKOCYTESUR NEGATIVE 01/31/2017 1944     Sepsis Labs Last Labs   Invalid input(s): PROCALCITONIN,  WBC,  LACTICIDVEN   Microbiology No results found for this or any previous visit (from the past 240 hour(s)).   Time coordinating discharge: 40 minutes  SIGNED:  Latrelle Dodrill, MD           [facility] 02/04/2017, 11:28 AM  Pager please text page via  [email or web address] Password [email or web address]

## 2017-02-06 DIAGNOSIS — L89153 Pressure ulcer of sacral region, stage 3: Secondary | ICD-10-CM | POA: Diagnosis not present

## 2017-02-06 DIAGNOSIS — L89893 Pressure ulcer of other site, stage 3: Secondary | ICD-10-CM | POA: Diagnosis not present

## 2017-02-06 DIAGNOSIS — L89613 Pressure ulcer of right heel, stage 3: Secondary | ICD-10-CM | POA: Diagnosis not present

## 2017-02-13 ENCOUNTER — Telehealth: Payer: Self-pay | Admitting: Neurology

## 2017-02-13 NOTE — Telephone Encounter (Signed)
Pt's wife called said he is living at facility for alzheimer's.  She became teary-eyed, said he was falling and the last time he fell, they think he hit his head. Said she could not get him up when he falls anymore, could not take care of him. She wanted to thank everyone that helped with his care.

## 2017-02-13 NOTE — Telephone Encounter (Signed)
I called the wife. The patient began having some difficulty with walking and falls and generalized weakness over the last 3 weeks or so. He is in an extended care facility, his wife can no longer take care of him, we can still follow him through this office however.

## 2017-02-14 DIAGNOSIS — L89154 Pressure ulcer of sacral region, stage 4: Secondary | ICD-10-CM | POA: Diagnosis not present

## 2017-02-14 DIAGNOSIS — L89613 Pressure ulcer of right heel, stage 3: Secondary | ICD-10-CM | POA: Diagnosis not present

## 2017-02-18 ENCOUNTER — Ambulatory Visit: Payer: Medicare Other | Admitting: Nurse Practitioner

## 2017-02-19 DIAGNOSIS — M6281 Muscle weakness (generalized): Secondary | ICD-10-CM | POA: Diagnosis not present

## 2017-02-19 DIAGNOSIS — R5381 Other malaise: Secondary | ICD-10-CM | POA: Diagnosis not present

## 2017-02-19 DIAGNOSIS — F0390 Unspecified dementia without behavioral disturbance: Secondary | ICD-10-CM | POA: Diagnosis not present

## 2017-02-20 ENCOUNTER — Emergency Department (HOSPITAL_COMMUNITY): Payer: Medicare Other

## 2017-02-20 ENCOUNTER — Encounter (HOSPITAL_COMMUNITY): Payer: Self-pay | Admitting: Emergency Medicine

## 2017-02-20 ENCOUNTER — Inpatient Hospital Stay (HOSPITAL_COMMUNITY)
Admission: EM | Admit: 2017-02-20 | Discharge: 2017-02-27 | DRG: 356 | Disposition: A | Discharge status: Skilled Nursing Facility | Payer: Medicare Other | Attending: Family Medicine | Admitting: Family Medicine

## 2017-02-20 DIAGNOSIS — I10 Essential (primary) hypertension: Secondary | ICD-10-CM | POA: Diagnosis present

## 2017-02-20 DIAGNOSIS — E46 Unspecified protein-calorie malnutrition: Secondary | ICD-10-CM | POA: Diagnosis present

## 2017-02-20 DIAGNOSIS — Z87891 Personal history of nicotine dependence: Secondary | ICD-10-CM | POA: Diagnosis not present

## 2017-02-20 DIAGNOSIS — Z8249 Family history of ischemic heart disease and other diseases of the circulatory system: Secondary | ICD-10-CM

## 2017-02-20 DIAGNOSIS — T39395A Adverse effect of other nonsteroidal anti-inflammatory drugs [NSAID], initial encounter: Secondary | ICD-10-CM | POA: Diagnosis present

## 2017-02-20 DIAGNOSIS — E785 Hyperlipidemia, unspecified: Secondary | ICD-10-CM | POA: Diagnosis present

## 2017-02-20 DIAGNOSIS — K922 Gastrointestinal hemorrhage, unspecified: Principal | ICD-10-CM | POA: Diagnosis present

## 2017-02-20 DIAGNOSIS — Z6822 Body mass index (BMI) 22.0-22.9, adult: Secondary | ICD-10-CM

## 2017-02-20 DIAGNOSIS — D649 Anemia, unspecified: Secondary | ICD-10-CM | POA: Diagnosis not present

## 2017-02-20 DIAGNOSIS — Y92129 Unspecified place in nursing home as the place of occurrence of the external cause: Secondary | ICD-10-CM

## 2017-02-20 DIAGNOSIS — R402411 Glasgow coma scale score 13-15, in the field [EMT or ambulance]: Secondary | ICD-10-CM | POA: Diagnosis not present

## 2017-02-20 DIAGNOSIS — Z9181 History of falling: Secondary | ICD-10-CM | POA: Diagnosis not present

## 2017-02-20 DIAGNOSIS — R4182 Altered mental status, unspecified: Secondary | ICD-10-CM | POA: Diagnosis not present

## 2017-02-20 DIAGNOSIS — D62 Acute posthemorrhagic anemia: Secondary | ICD-10-CM | POA: Diagnosis present

## 2017-02-20 DIAGNOSIS — F0390 Unspecified dementia without behavioral disturbance: Secondary | ICD-10-CM | POA: Diagnosis not present

## 2017-02-20 DIAGNOSIS — E870 Hyperosmolality and hypernatremia: Secondary | ICD-10-CM | POA: Diagnosis present

## 2017-02-20 DIAGNOSIS — M79673 Pain in unspecified foot: Secondary | ICD-10-CM | POA: Diagnosis not present

## 2017-02-20 DIAGNOSIS — L89224 Pressure ulcer of left hip, stage 4: Secondary | ICD-10-CM | POA: Diagnosis not present

## 2017-02-20 DIAGNOSIS — N400 Enlarged prostate without lower urinary tract symptoms: Secondary | ICD-10-CM | POA: Diagnosis present

## 2017-02-20 DIAGNOSIS — L89109 Pressure ulcer of unspecified part of back, unspecified stage: Secondary | ICD-10-CM | POA: Diagnosis not present

## 2017-02-20 DIAGNOSIS — J45909 Unspecified asthma, uncomplicated: Secondary | ICD-10-CM | POA: Diagnosis present

## 2017-02-20 DIAGNOSIS — M4628 Osteomyelitis of vertebra, sacral and sacrococcygeal region: Secondary | ICD-10-CM | POA: Diagnosis present

## 2017-02-20 DIAGNOSIS — L89893 Pressure ulcer of other site, stage 3: Secondary | ICD-10-CM | POA: Diagnosis not present

## 2017-02-20 DIAGNOSIS — R262 Difficulty in walking, not elsewhere classified: Secondary | ICD-10-CM | POA: Diagnosis not present

## 2017-02-20 DIAGNOSIS — Z79899 Other long term (current) drug therapy: Secondary | ICD-10-CM | POA: Diagnosis not present

## 2017-02-20 DIAGNOSIS — Z7982 Long term (current) use of aspirin: Secondary | ICD-10-CM

## 2017-02-20 DIAGNOSIS — R195 Other fecal abnormalities: Secondary | ICD-10-CM | POA: Insufficient documentation

## 2017-02-20 DIAGNOSIS — L89614 Pressure ulcer of right heel, stage 4: Secondary | ICD-10-CM | POA: Diagnosis not present

## 2017-02-20 DIAGNOSIS — F29 Unspecified psychosis not due to a substance or known physiological condition: Secondary | ICD-10-CM | POA: Diagnosis not present

## 2017-02-20 DIAGNOSIS — F329 Major depressive disorder, single episode, unspecified: Secondary | ICD-10-CM | POA: Diagnosis not present

## 2017-02-20 DIAGNOSIS — E860 Dehydration: Secondary | ICD-10-CM | POA: Diagnosis present

## 2017-02-20 DIAGNOSIS — G309 Alzheimer's disease, unspecified: Secondary | ICD-10-CM | POA: Diagnosis present

## 2017-02-20 DIAGNOSIS — Z66 Do not resuscitate: Secondary | ICD-10-CM | POA: Diagnosis present

## 2017-02-20 DIAGNOSIS — F339 Major depressive disorder, recurrent, unspecified: Secondary | ICD-10-CM | POA: Diagnosis not present

## 2017-02-20 DIAGNOSIS — M6282 Rhabdomyolysis: Secondary | ICD-10-CM | POA: Diagnosis not present

## 2017-02-20 DIAGNOSIS — G9341 Metabolic encephalopathy: Secondary | ICD-10-CM | POA: Diagnosis present

## 2017-02-20 DIAGNOSIS — F0280 Dementia in other diseases classified elsewhere without behavioral disturbance: Secondary | ICD-10-CM | POA: Diagnosis present

## 2017-02-20 DIAGNOSIS — K219 Gastro-esophageal reflux disease without esophagitis: Secondary | ICD-10-CM | POA: Diagnosis not present

## 2017-02-20 DIAGNOSIS — R739 Hyperglycemia, unspecified: Secondary | ICD-10-CM | POA: Diagnosis present

## 2017-02-20 DIAGNOSIS — N289 Disorder of kidney and ureter, unspecified: Secondary | ICD-10-CM | POA: Diagnosis not present

## 2017-02-20 DIAGNOSIS — R531 Weakness: Secondary | ICD-10-CM

## 2017-02-20 DIAGNOSIS — M6281 Muscle weakness (generalized): Secondary | ICD-10-CM | POA: Diagnosis not present

## 2017-02-20 DIAGNOSIS — Z7189 Other specified counseling: Secondary | ICD-10-CM | POA: Diagnosis not present

## 2017-02-20 DIAGNOSIS — Z515 Encounter for palliative care: Secondary | ICD-10-CM | POA: Diagnosis present

## 2017-02-20 DIAGNOSIS — L89154 Pressure ulcer of sacral region, stage 4: Secondary | ICD-10-CM

## 2017-02-20 DIAGNOSIS — L89623 Pressure ulcer of left heel, stage 3: Secondary | ICD-10-CM | POA: Diagnosis present

## 2017-02-20 DIAGNOSIS — Z820 Family history of epilepsy and other diseases of the nervous system: Secondary | ICD-10-CM

## 2017-02-20 DIAGNOSIS — R5383 Other fatigue: Secondary | ICD-10-CM | POA: Diagnosis not present

## 2017-02-20 DIAGNOSIS — L8994 Pressure ulcer of unspecified site, stage 4: Secondary | ICD-10-CM | POA: Diagnosis not present

## 2017-02-20 DIAGNOSIS — N401 Enlarged prostate with lower urinary tract symptoms: Secondary | ICD-10-CM | POA: Diagnosis not present

## 2017-02-20 DIAGNOSIS — K921 Melena: Secondary | ICD-10-CM | POA: Diagnosis not present

## 2017-02-20 DIAGNOSIS — M25519 Pain in unspecified shoulder: Secondary | ICD-10-CM | POA: Diagnosis not present

## 2017-02-20 DIAGNOSIS — L89150 Pressure ulcer of sacral region, unstageable: Secondary | ICD-10-CM | POA: Diagnosis not present

## 2017-02-20 DIAGNOSIS — R1312 Dysphagia, oropharyngeal phase: Secondary | ICD-10-CM | POA: Diagnosis not present

## 2017-02-20 DIAGNOSIS — Z833 Family history of diabetes mellitus: Secondary | ICD-10-CM

## 2017-02-20 LAB — COMPREHENSIVE METABOLIC PANEL
ALT: 33 U/L (ref 17–63)
ANION GAP: 11 (ref 5–15)
AST: 53 U/L — ABNORMAL HIGH (ref 15–41)
Albumin: 2.7 g/dL — ABNORMAL LOW (ref 3.5–5.0)
Alkaline Phosphatase: 62 U/L (ref 38–126)
BUN: 27 mg/dL — ABNORMAL HIGH (ref 6–20)
CHLORIDE: 110 mmol/L (ref 101–111)
CO2: 24 mmol/L (ref 22–32)
Calcium: 8.5 mg/dL — ABNORMAL LOW (ref 8.9–10.3)
Creatinine, Ser: 1.07 mg/dL (ref 0.61–1.24)
Glucose, Bld: 154 mg/dL — ABNORMAL HIGH (ref 65–99)
Potassium: 3.7 mmol/L (ref 3.5–5.1)
SODIUM: 145 mmol/L (ref 135–145)
Total Bilirubin: 0.2 mg/dL — ABNORMAL LOW (ref 0.3–1.2)
Total Protein: 6.7 g/dL (ref 6.5–8.1)

## 2017-02-20 LAB — POC OCCULT BLOOD, ED: FECAL OCCULT BLD: POSITIVE — AB

## 2017-02-20 LAB — CBC WITH DIFFERENTIAL/PLATELET
BASOS PCT: 0 %
Basophils Absolute: 0 10*3/uL (ref 0.0–0.1)
Eosinophils Absolute: 0 10*3/uL (ref 0.0–0.7)
Eosinophils Relative: 0 %
HCT: 17.2 % — ABNORMAL LOW (ref 39.0–52.0)
Hemoglobin: 5.8 g/dL — CL (ref 13.0–17.0)
LYMPHS ABS: 0.7 10*3/uL (ref 0.7–4.0)
Lymphocytes Relative: 6 %
MCH: 32.2 pg (ref 26.0–34.0)
MCHC: 33.7 g/dL (ref 30.0–36.0)
MCV: 95.6 fL (ref 78.0–100.0)
MONO ABS: 0.5 10*3/uL (ref 0.1–1.0)
Monocytes Relative: 5 %
NEUTROS ABS: 9.7 10*3/uL — AB (ref 1.7–7.7)
NEUTROS PCT: 89 %
PLATELETS: 324 10*3/uL (ref 150–400)
RBC: 1.8 MIL/uL — ABNORMAL LOW (ref 4.22–5.81)
RDW: 14.8 % (ref 11.5–15.5)
WBC: 10.9 10*3/uL — ABNORMAL HIGH (ref 4.0–10.5)

## 2017-02-20 LAB — PREPARE RBC (CROSSMATCH)

## 2017-02-20 LAB — PROTIME-INR
INR: 1.11
PROTHROMBIN TIME: 14.3 s (ref 11.4–15.2)

## 2017-02-20 LAB — I-STAT CG4 LACTIC ACID, ED: LACTIC ACID, VENOUS: 2.81 mmol/L — AB (ref 0.5–1.9)

## 2017-02-20 LAB — ABO/RH: ABO/RH(D): O POS

## 2017-02-20 LAB — BRAIN NATRIURETIC PEPTIDE: B NATRIURETIC PEPTIDE 5: 176.8 pg/mL — AB (ref 0.0–100.0)

## 2017-02-20 MED ORDER — VANCOMYCIN HCL 10 G IV SOLR
1500.0000 mg | Freq: Once | INTRAVENOUS | Status: AC
  Administered 2017-02-20: 1500 mg via INTRAVENOUS
  Filled 2017-02-20: qty 1500

## 2017-02-20 MED ORDER — OXYCODONE-ACETAMINOPHEN 5-325 MG PO TABS
1.0000 | ORAL_TABLET | Freq: Four times a day (QID) | ORAL | Status: DC | PRN
  Administered 2017-02-23 – 2017-02-27 (×5): 1 via ORAL
  Filled 2017-02-20 (×5): qty 1

## 2017-02-20 MED ORDER — SODIUM CHLORIDE 0.45 % IV SOLN
INTRAVENOUS | Status: DC
  Administered 2017-02-20: 22:00:00 via INTRAVENOUS
  Filled 2017-02-20 (×3): qty 1000

## 2017-02-20 MED ORDER — NITROGLYCERIN 0.4 MG SL SUBL: 0.4000 mg | SUBLINGUAL_TABLET | SUBLINGUAL | Status: DC | PRN

## 2017-02-20 MED ORDER — RISPERIDONE 1 MG PO TABS
1.0000 mg | ORAL_TABLET | Freq: Two times a day (BID) | ORAL | Status: DC
  Administered 2017-02-21 – 2017-02-27 (×12): 1 mg via ORAL
  Filled 2017-02-20: qty 1
  Filled 2017-02-20 (×7): qty 4
  Filled 2017-02-20: qty 1
  Filled 2017-02-20 (×3): qty 4

## 2017-02-20 MED ORDER — PANTOPRAZOLE SODIUM 40 MG PO TBEC
40.0000 mg | DELAYED_RELEASE_TABLET | Freq: Two times a day (BID) | ORAL | Status: DC
  Administered 2017-02-21 (×2): 40 mg via ORAL
  Filled 2017-02-20 (×2): qty 1

## 2017-02-20 MED ORDER — ACETAMINOPHEN 325 MG PO TABS
650.0000 mg | ORAL_TABLET | Freq: Four times a day (QID) | ORAL | Status: DC | PRN
  Administered 2017-02-22 – 2017-02-26 (×2): 650 mg via ORAL
  Filled 2017-02-20 (×2): qty 2

## 2017-02-20 MED ORDER — CEFEPIME HCL 1 G IJ SOLR
1.0000 g | Freq: Three times a day (TID) | INTRAMUSCULAR | Status: DC
  Administered 2017-02-21 – 2017-02-22 (×5): 1 g via INTRAVENOUS
  Filled 2017-02-20 (×6): qty 1

## 2017-02-20 MED ORDER — MONTELUKAST SODIUM 10 MG PO TABS
10.0000 mg | ORAL_TABLET | Freq: Every day | ORAL | Status: DC
  Administered 2017-02-21 – 2017-02-26 (×7): 10 mg via ORAL
  Filled 2017-02-20 (×7): qty 1

## 2017-02-20 MED ORDER — ONDANSETRON HCL 4 MG PO TABS: 4.0000 mg | ORAL_TABLET | Freq: Four times a day (QID) | ORAL | Status: DC | PRN

## 2017-02-20 MED ORDER — MEMANTINE HCL 10 MG PO TABS
10.0000 mg | ORAL_TABLET | Freq: Two times a day (BID) | ORAL | Status: DC
  Administered 2017-02-21 – 2017-02-27 (×14): 10 mg via ORAL
  Filled 2017-02-20 (×13): qty 1

## 2017-02-20 MED ORDER — SODIUM CHLORIDE 0.9 % IV SOLN
Freq: Once | INTRAVENOUS | Status: AC
Start: 2017-02-20 — End: 2017-02-20
  Administered 2017-02-20: 250 mL via INTRAVENOUS

## 2017-02-20 MED ORDER — ACETAMINOPHEN 325 MG PO TABS
650.0000 mg | ORAL_TABLET | ORAL | Status: AC
  Administered 2017-02-20: 650 mg via ORAL
  Filled 2017-02-20: qty 2

## 2017-02-20 MED ORDER — ONDANSETRON HCL 4 MG/2ML IJ SOLN: 4.0000 mg | Freq: Four times a day (QID) | INTRAMUSCULAR | Status: DC | PRN

## 2017-02-20 MED ORDER — VITAMIN B-12 1000 MCG PO TABS
500.0000 ug | ORAL_TABLET | Freq: Every day | ORAL | Status: DC
  Administered 2017-02-21: 1000 ug via ORAL
  Administered 2017-02-22 – 2017-02-27 (×6): 500 ug via ORAL
  Filled 2017-02-20 (×7): qty 1

## 2017-02-20 MED ORDER — PANTOPRAZOLE SODIUM 40 MG IV SOLR
40.0000 mg | Freq: Once | INTRAVENOUS | Status: AC
  Administered 2017-02-20: 40 mg via INTRAVENOUS
  Filled 2017-02-20: qty 40

## 2017-02-20 MED ORDER — SERTRALINE HCL 50 MG PO TABS
25.0000 mg | ORAL_TABLET | Freq: Every day | ORAL | Status: DC
  Administered 2017-02-21: 25 mg via ORAL
  Administered 2017-02-22: 12:00:00 via ORAL
  Administered 2017-02-23 – 2017-02-27 (×5): 25 mg via ORAL
  Filled 2017-02-20 (×7): qty 1

## 2017-02-20 MED ORDER — SODIUM CHLORIDE 0.9 % IV SOLN: Freq: Once | INTRAVENOUS | Status: DC

## 2017-02-20 MED ORDER — FLUTICASONE PROPIONATE 50 MCG/ACT NA SUSP
2.0000 | Freq: Every day | NASAL | Status: DC
  Administered 2017-02-21 – 2017-02-27 (×6): 2 via NASAL
  Filled 2017-02-20: qty 16

## 2017-02-20 MED ORDER — VITAMIN B-12 500 MCG PO TABS: 500.0000 ug | ORAL_TABLET | Freq: Every day | ORAL | Status: DC

## 2017-02-20 MED ORDER — TAMSULOSIN HCL 0.4 MG PO CAPS
0.4000 mg | ORAL_CAPSULE | Freq: Every day | ORAL | Status: DC
  Administered 2017-02-21 – 2017-02-27 (×7): 0.4 mg via ORAL
  Filled 2017-02-20 (×7): qty 1

## 2017-02-20 MED ORDER — DONEPEZIL HCL 10 MG PO TABS
10.0000 mg | ORAL_TABLET | Freq: Every day | ORAL | Status: DC
  Administered 2017-02-21 – 2017-02-26 (×7): 10 mg via ORAL
  Filled 2017-02-20 (×6): qty 1

## 2017-02-20 MED ORDER — ENSURE ENLIVE PO LIQD
237.0000 mL | Freq: Two times a day (BID) | ORAL | Status: DC
  Administered 2017-02-21 – 2017-02-27 (×9): 237 mL via ORAL

## 2017-02-20 MED ORDER — FINASTERIDE 5 MG PO TABS
5.0000 mg | ORAL_TABLET | Freq: Every day | ORAL | Status: DC
  Administered 2017-02-21 – 2017-02-27 (×7): 5 mg via ORAL
  Filled 2017-02-20 (×7): qty 1

## 2017-02-20 MED ORDER — VANCOMYCIN HCL IN DEXTROSE 1-5 GM/200ML-% IV SOLN
1000.0000 mg | Freq: Two times a day (BID) | INTRAVENOUS | Status: DC
  Administered 2017-02-21 – 2017-02-26 (×12): 1000 mg via INTRAVENOUS
  Filled 2017-02-20 (×9): qty 200

## 2017-02-20 MED ORDER — CEFEPIME HCL 2 G IJ SOLR
2.0000 g | Freq: Once | INTRAMUSCULAR | Status: AC
  Administered 2017-02-20: 2 g via INTRAVENOUS
  Filled 2017-02-20: qty 2

## 2017-02-20 MED ORDER — ADULT MULTIVITAMIN W/MINERALS CH
1.0000 | ORAL_TABLET | Freq: Every day | ORAL | Status: DC
  Administered 2017-02-21 – 2017-02-27 (×7): 1 via ORAL
  Filled 2017-02-20 (×7): qty 1

## 2017-02-20 MED ORDER — OMEGA-3-ACID ETHYL ESTERS 1 G PO CAPS
1.0000 g | ORAL_CAPSULE | Freq: Every day | ORAL | Status: DC
  Administered 2017-02-21 – 2017-02-27 (×7): 1 g via ORAL
  Filled 2017-02-20 (×7): qty 1

## 2017-02-20 MED ORDER — ACETAMINOPHEN 650 MG RE SUPP: 650.0000 mg | Freq: Four times a day (QID) | RECTAL | Status: DC | PRN

## 2017-02-20 NOTE — Progress Notes (Signed)
Addendum to earlier note:  My covering physician can be reached this evening, if needed, at [phone].  Florencia Reasons, M.D. Pager [phone] If no answer or after 5 PM call [phone]

## 2017-02-20 NOTE — H&P (Signed)
History and Physical  Andre Ball YDX:[id number] DOB: Mar 10, 1941 DOA: 02/20/2017   PCP: Maggie Font, MD   Patient coming from: SNF  Chief Complaint: fatigue  HPI:  Andre Ball is a 76 y.o. male with medical history of dementia, BPH, hypertension, hyperlipidemia presenting from the nursing home secondary to fatigue. Unfortunately, the patient has a history of dementia, and he is unable to provide any history. All of this history is obtained from review of the medical record in speaking with the patient's wife. The patient was recently discharged from the hospital after a stay from 01/31/2017 through 02/04/2017 during which time the patient was treated for rhabdomyolysis secondary to frequent falls as well as his sacral and foot decubitus. The patient was discharged in stable condition to skilled nursing facility. His hemoglobin was 11.6 at the time of discharge. The patient was noted to have increasing fatigue and lethargy. Routine blood work was done and showed a hemoglobin in the 5 range. As a result, the patient was brought to the emergency department for further evaluation. There's been no recent hematemesis, hematuria, hematochezia, melena per his wife. The patient has not been started on any NSAIDs recently. There's been no fevers, chills, chest pain, abdominal pain.  In the emergency department, the patient was afebrile hemodynamically stable saturating 99% on room air. Sodium was 145. Otherwise BMP was unremarkable. Hemoglobin was noted to be 5.8 with the BBC 10.9 and platelets 224,000. INR was 1.11. Lactic acid was 2.1. EKG shows sinus rhythm with nonspecific T-wave changes. Chest x-ray was negative for any infiltrates or edema. Fecal occult blood test was positive without hematochezia per EDP.  Assessment/Plan: Acute Blood Loss Anemia/Heme Positive Stool/Symptomatic Anemia -Concerned about GI bleed -question NSAID induced--pt on DayPro as noted on MAR -[facility] has been  consulted -start PPI -clear liquids only for now -Baseline hemoglobin approximately 11 -Transfuse 3 units PRBC -Hold aspirin  Infected sacral decubitus ulcer--stage 4 -This ulcer was present prior to his last hospitalization -bone palpable on exam -Start empiric vancomycin and cefepime -MRI pelvis rule out abscess/osteomyelitis  -CRP/ESR -Consult wound care nurse   Right heel decubitus -Not infected on exam -Consult wound care nurse  Hypertension -Holding lisinopril -BP acceptable  Hypernatremia -start 1/2NS  Dementia -continue aricept and namenda  Hyperglycemia -A1C  BPH -continue tamulosin and finasteride     Past Medical History:  Diagnosis Date  . Arthritis   . Asthma   . BPH (benign prostatic hyperplasia)   . Memory deficits 11/01/2014   Past Surgical History:  Procedure Laterality Date  . TONSILLECTOMY     Social History:  reports that he has quit smoking. He has never used smokeless tobacco. He reports that he does not drink alcohol or use drugs.   Family History  Problem Relation Age of Onset  . Alzheimer's disease Mother   . Alzheimer's disease Father   . Diabetes Brother   . Alzheimer's disease Sister   . Heart disease Brother   . Diabetes Brother   . Alzheimer's disease Sister   . Alzheimer's disease Brother   . Alzheimer's disease Brother      No Known Allergies   Prior to Admission medications   Medication Sig Start Date End Date Taking? Authorizing Provider  aspirin EC 81 MG tablet Take 81 mg by mouth daily.   Yes Historical Provider, MD  collagenase (SANTYL) ointment Apply 1 application topically daily. Patient taking differently: Apply 1 application topically every evening. Pt  applies to right heel and sacrum. 02/04/17  Yes Doreatha Lew, MD  diclofenac sodium (VOLTAREN) 1 % GEL Apply 1 application topically daily as needed (for leg/shoulder pain).    Yes Historical Provider, MD  donepezil (ARICEPT) 10 MG tablet Take 1 tablet  (10 mg total) by mouth at bedtime. 08/20/16  Yes Kathrynn Ducking, MD  feeding supplement, ENSURE ENLIVE, (ENSURE ENLIVE) LIQD Take 237 mLs by mouth 2 (two) times daily between meals. 02/04/17  Yes Doreatha Lew, MD  finasteride (PROSCAR) 5 MG tablet Take 5 mg by mouth daily.   Yes Historical Provider, MD  fluticasone (FLONASE) 50 MCG/ACT nasal spray Place 2 sprays into the nose daily. 01/30/13  Yes Harden Mo, MD  lisinopril (PRINIVIL,ZESTRIL) 10 MG tablet Take 10 mg by mouth daily.   Yes Historical Provider, MD  memantine (NAMENDA) 10 MG tablet Take 1 tablet (10 mg total) by mouth 2 (two) times daily. 08/20/16  Yes Kathrynn Ducking, MD  montelukast (SINGULAIR) 10 MG tablet Take 10 mg by mouth at bedtime.   Yes Historical Provider, MD  Multiple Vitamin (MULTIVITAMIN WITH MINERALS) TABS tablet Take 1 tablet by mouth daily.   Yes Historical Provider, MD  nitroGLYCERIN (NITROSTAT) 0.4 MG SL tablet Place 0.4 mg under the tongue every 5 (five) minutes as needed for chest pain.   Yes Historical Provider, MD  omega-3 acid ethyl esters (LOVAZA) 1 g capsule Take 1 g by mouth daily.   Yes Historical Provider, MD  oxaprozin (DAYPRO) 600 MG tablet Take 1 tablet (600 mg total) by mouth 2 (two) times daily. 11/13/16  Yes Gardiner Barefoot, DPM  oxyCODONE-acetaminophen (PERCOCET/ROXICET) 5-325 MG tablet Take 1 tablet by mouth every 6 (six) hours as needed for moderate pain. 02/04/17  Yes Doreatha Lew, MD  risperiDONE (RISPERDAL) 1 MG tablet Take 1 mg by mouth 2 (two) times daily.   Yes Historical Provider, MD  sertraline (ZOLOFT) 25 MG tablet Take 25 mg by mouth daily.   Yes Historical Provider, MD  sodium hypochlorite (DAKIN'S 1/4 STRENGTH) 0.125 % SOLN Apply 1 application topically 2 (two) times daily. Pt applies to sacrum.   Yes Historical Provider, MD  tamsulosin (FLOMAX) 0.4 MG CAPS Take 0.4 mg by mouth daily.   Yes Historical Provider, MD  vitamin B-12 (CYANOCOBALAMIN) 500 MCG tablet Take 500 mcg by  mouth daily.   Yes Historical Provider, MD    Review of Systems:  Unobtainable secondary to dementia  Physical Exam: Vitals:   02/20/17 1444 02/20/17 1457 02/20/17 1600 02/20/17 1707  BP: (!) 129/54  138/60 127/62  Pulse: 92  87   Resp: '18  20 19  ' Temp:  98 F (36.7 C)    TempSrc:  Oral    SpO2: 99%  92% 96%   General:  Awake and alert, NAD, nontoxic, pleasant/cooperative Head/Eye: No conjunctival hemorrhage, no icterus, [place]/AT, No nystagmus ENT:  No icterus,  No thrush, good dentition, no pharyngeal exudate Neck:  No masses, no lymphadenpathy, no bruits CV:  RRR, no rub, no gallop, no S3 Lung:  Bibasilar crackles. No wheezing. Good air movement. Abdomen: soft/NT, +BS, nondistended, no peritoneal signs Ext: No cyanosis, No rashes, No petechiae, No lymphangitis, No edema Sacrum--4x4 cm ulcer with necrotic tissue, malodorous, bone palpable  Labs on Admission:  Basic Metabolic Panel:  Recent Labs Lab 02/20/17 1535  NA 145  K 3.7  CL 110  CO2 24  GLUCOSE 154*  BUN 27*  CREATININE 1.07  CALCIUM 8.5*  Liver Function Tests:  Recent Labs Lab 02/20/17 1535  AST 53*  ALT 33  ALKPHOS 62  BILITOT 0.2*  PROT 6.7  ALBUMIN 2.7*   No results for input(s): LIPASE, AMYLASE in the last 168 hours. No results for input(s): AMMONIA in the last 168 hours. CBC:  Recent Labs Lab 02/20/17 1535  WBC 10.9*  NEUTROABS 9.7*  HGB 5.8*  HCT 17.2*  MCV 95.6  PLT 324   Coagulation Profile:  Recent Labs Lab 02/20/17 1535  INR 1.11   Cardiac Enzymes: No results for input(s): CKTOTAL, CKMB, CKMBINDEX, TROPONINI in the last 168 hours. BNP: Invalid input(s): POCBNP CBG: No results for input(s): GLUCAP in the last 168 hours. Urine analysis:    Component Value Date/Time   COLORURINE YELLOW 01/31/2017 1944   APPEARANCEUR CLOUDY (A) 01/31/2017 1944   LABSPEC 1.030 01/31/2017 1944   PHURINE 5.0 01/31/2017 1944   GLUCOSEU NEGATIVE 01/31/2017 1944   HGBUR NEGATIVE  01/31/2017 1944   BILIRUBINUR NEGATIVE 01/31/2017 1944   KETONESUR NEGATIVE 01/31/2017 1944   PROTEINUR NEGATIVE 01/31/2017 1944   NITRITE NEGATIVE 01/31/2017 1944   LEUKOCYTESUR NEGATIVE 01/31/2017 1944   Sepsis Labs: '@LABRCNTIP' (procalcitonin:4,lacticidven:4) )No results found for this or any previous visit (from the past 240 hour(s)).   Radiological Exams on Admission: Dg Chest 2 View  Result Date: 02/20/2017 CLINICAL DATA:  Fatigue.  Dementia. EXAM: CHEST  2 VIEW COMPARISON:  One-view chest x-ray 01/31/2017. Two-view chest x-ray 01/16/2017. FINDINGS: The heart is mildly enlarged. Lung volumes are low. There is no edema. There is some blunting of the CP angles posteriorly on both sides. Degenerative changes are again seen in the thoracic spine. IMPRESSION: 1. Blunting of posterior CP angle bilaterally appears chronic. No definite effusions. 2. Otherwise negative two view chest x-ray. Electronically Signed   By: San Morelle M.D.   On: 02/20/2017 15:24    EKG: Independently reviewed. Sinus, nonspecific T wave changes    Time spent:60 minutes Code Status:   DNR Family Communication: spouse updated on phone Disposition Plan: expect 2-3 day hospitalization Consults called: [facility] DVT Prophylaxis: SCDs  Tawan Degroote, DO  [facility] Pager [phone]  If 7PM-7AM, please contact night-coverage [email or web address] Password [facility] 02/20/2017, 5:31 PM

## 2017-02-20 NOTE — Progress Notes (Signed)
Preliminary note--patient not seen yet  We have received a call, asking [place] to see this patient in consultation because of heme positive stool and anemia. We will plan to see him tomorrow, but if he should become unstable during the night, feel free to contact us for more urgent evaluation and intervention.  I did speak with the patient's wife on the phone for about 20 minutes, going over the patient's current status as discussed with the emergency room physician, and going over the pros and cons of endoscopic evaluation versus empiric medical therapy.  Taking into account the patient's dementia and overall medical condition, and the low likelihood that endoscopic evaluation would detect findings that would significantly change the patient's management, it is the patient's wife's wish that we not pursue endoscopy at the present time, and I also feel that this is the best course of action. We can review that decision according to the patient's clinical evolution and response to treatment.  Therefore, I agree with the current management that includes a clear liquid diet and monitoring of labs in the morning.  I have also ordered a standing dose of pantoprazole, orally, twice a day.

## 2017-02-20 NOTE — ED Notes (Signed)
Patient transported to CT 

## 2017-02-20 NOTE — Progress Notes (Signed)
Pharmacy Antibiotic Follow-up Note  TRESTON COKER is a 76 y.o. year-old male admitted on 02/20/2017.  The patient is currently on day 1 of Vancomycin & Cefepime for osteomyelitis.  Assessment/Plan: Vancomycin  x1, then 1gm IV every 12 hours.  Goal trough 15-20 mcg/mL.  Cefepime 2gm x1, then 1gm q8h  Temp (24hrs), Avg:98.5 F (36.9 C), Min:98 F (36.7 C), Max:99.2 F (37.3 C)   Recent Labs Lab 02/20/17 1535  WBC 10.9*    Recent Labs Lab 02/20/17 1535  CREATININE 1.07   Estimated Creatinine Clearance: 62.5 mL/min (by C-G formula based on SCr of 1.07 mg/dL).    No Known Allergies  Antimicrobials this admission: 4/25 Cefepime >>  4/25 Vancomycin >>   Levels/dose changes this admission:  Microbiology results: No cultures  Thank you for allowing pharmacy to be a part of this patient's care.  Otho Bellows PharmD Pager [phone] 02/20/2017, 7:19 PM

## 2017-02-20 NOTE — ED Provider Notes (Signed)
[facility]-EMERGENCY DEPT Provider Note   CSN: [id number] Arrival date & time: 02/20/17  1427     History   Chief Complaint Chief Complaint  Patient presents with  . low HGB    HPI Andre Ball is a 76 y.o. male.  History provided by wife as patient is only alert and oriented 1 and has dementia.  The history is provided by the patient and the spouse. The history is limited by the condition of the patient. No language interpreter was used.  Dizziness  Quality:  Lightheadedness Severity:  Moderate Onset quality:  Gradual Duration:  3 days Timing:  Constant Progression:  Unchanged Chronicity:  New Context: not with loss of consciousness   Relieved by:  Nothing Worsened by:  Nothing Ineffective treatments:  None tried Associated symptoms: no chest pain, no diarrhea, no headaches, no nausea, no shortness of breath, no syncope and no vomiting   Risk factors: anemia (per EMS)     Past Medical History:  Diagnosis Date  . Arthritis   . Asthma   . BPH (benign prostatic hyperplasia)   . Memory deficits 11/01/2014    Patient Active Problem List   Diagnosis Date Noted  . Rhabdomyolysis 02/01/2017  . Malnutrition of moderate degree 02/01/2017  . Pressure injury of skin 02/01/2017  . Dehydration 01/31/2017  . Hypernatremia 01/31/2017  . General weakness 01/31/2017  . Essential hypertension 01/31/2017  . Dementia 01/31/2017  . Memory deficits 11/01/2014    Past Surgical History:  Procedure Laterality Date  . TONSILLECTOMY         Home Medications    Prior to Admission medications   Medication Sig Start Date End Date Taking? Authorizing Provider  aspirin 81 MG tablet Take 81 mg by mouth daily.    Historical Provider, MD  collagenase (SANTYL) ointment Apply 1 application topically daily. 02/04/17   Lenox Ponds, MD  Cyanocobalamin (VITAMIN B 12 PO) Take 1 tablet by mouth daily.     Historical Provider, MD  diclofenac sodium (VOLTAREN) 1 % GEL Apply 1  application topically as needed (for pain).    Historical Provider, MD  donepezil (ARICEPT) 10 MG tablet Take 1 tablet (10 mg total) by mouth at bedtime. 08/20/16   York Spaniel, MD  dutasteride (AVODART) 0.5 MG capsule Take 0.5 mg by mouth daily. 11/02/16   Historical Provider, MD  feeding supplement, ENSURE ENLIVE, (ENSURE ENLIVE) LIQD Take 237 mLs by mouth 2 (two) times daily between meals. 02/04/17   Lenox Ponds, MD  fluticasone (FLONASE) 50 MCG/ACT nasal spray Place 2 sprays into the nose daily. Patient taking differently: Place 2 sprays into the nose daily as needed for allergies.  01/30/13   Reuben Likes, MD  lisinopril (PRINIVIL,ZESTRIL) 10 MG tablet Take 10 mg by mouth daily. 06/26/16   Historical Provider, MD  memantine (NAMENDA) 10 MG tablet Take 1 tablet (10 mg total) by mouth 2 (two) times daily. 08/20/16   York Spaniel, MD  montelukast (SINGULAIR) 10 MG tablet Take 10 mg by mouth at bedtime.    Historical Provider, MD  Multiple Vitamins-Minerals (MULTIVITAMIN PO) Take 1 tablet by mouth daily.    Historical Provider, MD  nitroGLYCERIN (NITROSTAT) 0.4 MG SL tablet Place 0.4 mg under the tongue every 5 (five) minutes as needed for chest pain.    Historical Provider, MD  Omega-3 Fatty Acids (FISH OIL PO) Take 1 capsule by mouth daily.    Historical Provider, MD  oxaprozin (DAYPRO) 600 MG tablet Take  1 tablet (600 mg total) by mouth 2 (two) times daily. Patient not taking: Reported on 01/16/2017 11/13/16   Helane Gunther, DPM  oxyCODONE-acetaminophen (PERCOCET/ROXICET) 5-325 MG tablet Take 1 tablet by mouth every 6 (six) hours as needed for moderate pain. 02/04/17   Lenox Ponds, MD  risperiDONE (RISPERDAL) 1 MG tablet Take 1 tablet (1 mg total) by mouth 2 (two) times daily. Patient taking differently: Take 1 mg by mouth daily.  12/12/16 01/31/17  Alvira Monday, MD  sertraline (ZOLOFT) 25 MG tablet Take 25 mg by mouth daily.    Historical Provider, MD  tamsulosin (FLOMAX) 0.4 MG  CAPS Take 0.4 mg by mouth daily.    Historical Provider, MD    Family History Family History  Problem Relation Age of Onset  . Alzheimer's disease Mother   . Alzheimer's disease Father   . Diabetes Brother   . Alzheimer's disease Sister   . Heart disease Brother   . Diabetes Brother   . Alzheimer's disease Sister   . Alzheimer's disease Brother   . Alzheimer's disease Brother     Social History Social History  Substance Use Topics  . Smoking status: Former Games developer  . Smokeless tobacco: Never Used  . Alcohol use No     Comment: occasionally     Allergies   Patient has no known allergies.   Review of Systems Review of Systems  Constitutional: Positive for fatigue. Negative for appetite change, chills, diaphoresis and fever.  HENT: Negative for congestion and rhinorrhea.   Eyes: Negative for visual disturbance.  Respiratory: Negative for chest tightness, shortness of breath, wheezing and stridor.   Cardiovascular: Negative for chest pain and syncope.  Gastrointestinal: Negative for abdominal pain, diarrhea, nausea and vomiting.  Genitourinary: Negative for dysuria and flank pain.  Musculoskeletal: Positive for back pain (chronic). Negative for neck pain and neck stiffness.  Skin: Positive for wound. Negative for rash.  Neurological: Positive for light-headedness. Negative for dizziness, syncope and headaches.  Psychiatric/Behavioral: Positive for confusion (unchanged from prior per wife). Negative for agitation.  All other systems reviewed and are negative.    Physical Exam Updated Vital Signs BP (!) 129/54 (BP Location: Right Arm)   Pulse 92   Temp 98 F (36.7 C) (Oral)   Resp 18   SpO2 99%   Physical Exam  Constitutional: He is oriented to person, place, and time. He appears well-developed and well-nourished. No distress.  HENT:  Head: Normocephalic and atraumatic.  Right Ear: External ear normal.  Left Ear: External ear normal.  Nose: Nose normal.    Mouth/Throat: Oropharynx is clear and moist. No oropharyngeal exudate.  Eyes: Conjunctivae and EOM are normal. Pupils are equal, round, and reactive to light.  Neck: Normal range of motion. Neck supple.  Cardiovascular: Normal rate and normal heart sounds.   No murmur heard. Pulmonary/Chest: Effort normal. No stridor. No respiratory distress. He has no wheezes. He exhibits no tenderness.  Abdominal: Soft. There is no tenderness. There is no rebound and no guarding.  Musculoskeletal: He exhibits no edema or tenderness.       Left hip: He exhibits no tenderness.       Lumbar back: He exhibits no tenderness.       Back:       Legs:      Left foot: There is no tenderness.       Feet:  Neurological: He is alert and oriented to person, place, and time. He displays normal reflexes. No cranial nerve  deficit. He exhibits normal muscle tone. Coordination normal.  Skin: Skin is warm. Capillary refill takes less than 2 seconds. No rash noted. He is not diaphoretic. No erythema.  Psychiatric: He has a normal mood and affect.  Nursing note and vitals reviewed.    ED Treatments / Results  Labs (all labs ordered are listed, but only abnormal results are displayed) Labs Reviewed  CBC WITH DIFFERENTIAL/PLATELET - Abnormal; Notable for the following:       Result Value   WBC 10.9 (*)    RBC 1.80 (*)    Hemoglobin 5.8 (*)    HCT 17.2 (*)    Neutro Abs 9.7 (*)    All other components within normal limits  COMPREHENSIVE METABOLIC PANEL - Abnormal; Notable for the following:    Glucose, Bld 154 (*)    BUN 27 (*)    Calcium 8.5 (*)    Albumin 2.7 (*)    AST 53 (*)    Total Bilirubin 0.2 (*)    All other components within normal limits  URINALYSIS, ROUTINE W REFLEX MICROSCOPIC - Abnormal; Notable for the following:    APPearance HAZY (*)    All other components within normal limits  BRAIN NATRIURETIC PEPTIDE - Abnormal; Notable for the following:    B Natriuretic Peptide 176.8 (*)    All  other components within normal limits  BASIC METABOLIC PANEL - Abnormal; Notable for the following:    BUN 23 (*)    Calcium 8.0 (*)    All other components within normal limits  CBC - Abnormal; Notable for the following:    RBC 2.74 (*)    Hemoglobin 8.5 (*)    HCT 25.1 (*)    All other components within normal limits  SEDIMENTATION RATE - Abnormal; Notable for the following:    Sed Rate 60 (*)    All other components within normal limits  I-STAT CG4 LACTIC ACID, ED - Abnormal; Notable for the following:    Lactic Acid, Venous 2.81 (*)    All other components within normal limits  POC OCCULT BLOOD, ED - Abnormal; Notable for the following:    Fecal Occult Bld POSITIVE (*)    All other components within normal limits  MRSA PCR SCREENING  PROTIME-INR  LACTIC ACID, PLASMA  HEMOGLOBIN A1C  LACTIC ACID, PLASMA  HIGH SENSITIVITY CRP  I-STAT CG4 LACTIC ACID, ED  TYPE AND SCREEN  PREPARE RBC (CROSSMATCH)  ABO/RH  PREPARE RBC (CROSSMATCH)    EKG  EKG Interpretation  Date/Time:  Wednesday February 20 2017 15:48:30 EDT Ventricular Rate:  93 PR Interval:    QRS Duration: 99 QT Interval:  372 QTC Calculation: 463 R Axis:   68 Text Interpretation:  Sinus tachycardia Paired ventricular premature complexes Aberrant complex RSR' in V1 or V2, right VCD or RVH Baseline wander in lead(s) V6 When compared to prior, no significant changes seen. No STEMI Confirmed by Rush Landmark MD, Lenzie Montesano [phone]) on 02/20/2017 3:53:33 PM       Radiology Dg Chest 2 View  Result Date: 02/20/2017 CLINICAL DATA:  Fatigue.  Dementia. EXAM: CHEST  2 VIEW COMPARISON:  One-view chest x-ray 01/31/2017. Two-view chest x-ray 01/16/2017. FINDINGS: The heart is mildly enlarged. Lung volumes are low. There is no edema. There is some blunting of the CP angles posteriorly on both sides. Degenerative changes are again seen in the thoracic spine. IMPRESSION: 1. Blunting of posterior CP angle bilaterally appears chronic. No  definite effusions. 2. Otherwise negative two view chest  x-ray. Electronically Signed   By: Marin Roberts M.D.   On: 02/20/2017 15:24    Procedures Procedures (including critical care time)  CRITICAL CARE Performed by: Canary Brim Marcelene Weidemann Total critical care time: 60 minutes Critical care time was exclusive of separately billable procedures and treating other patients. Critical care was necessary to treat or prevent imminent or life-threatening deterioration. Critical care was time spent personally by me on the following activities: development of treatment plan with patient and/or surrogate as well as nursing, discussions with consultants, evaluation of patient's response to treatment, examination of patient, obtaining history from patient or surrogate, ordering and performing treatments and interventions, ordering and review of laboratory studies, ordering and review of radiographic studies, pulse oximetry and re-evaluation of patient's condition.   Medications Ordered in ED Medications  0.9 %  sodium chloride infusion (not administered)  finasteride (PROSCAR) tablet 5 mg (5 mg Oral Given 02/21/17 1158)  multivitamin with minerals tablet 1 tablet (1 tablet Oral Given 02/21/17 1200)  risperiDONE (RISPERDAL) tablet 1 mg (1 mg Oral Given 02/21/17 1158)  omega-3 acid ethyl esters (LOVAZA) capsule 1 g (1 g Oral Given 02/21/17 1200)  feeding supplement (ENSURE ENLIVE) (ENSURE ENLIVE) liquid 237 mL (237 mLs Oral Not Given 02/21/17 1159)  oxyCODONE-acetaminophen (PERCOCET/ROXICET) 5-325 MG per tablet 1 tablet (not administered)  donepezil (ARICEPT) tablet 10 mg (10 mg Oral Given 02/21/17 0040)  memantine (NAMENDA) tablet 10 mg (10 mg Oral Given 02/21/17 1159)  nitroGLYCERIN (NITROSTAT) SL tablet 0.4 mg (not administered)  fluticasone (FLONASE) 50 MCG/ACT nasal spray 2 spray (2 sprays Each Nare Given 02/21/17 1159)  montelukast (SINGULAIR) tablet 10 mg (10 mg Oral Given 02/21/17 0040)    sertraline (ZOLOFT) tablet 25 mg (25 mg Oral Given 02/21/17 1158)  tamsulosin (FLOMAX) capsule 0.4 mg (0.4 mg Oral Given 02/21/17 1158)  acetaminophen (TYLENOL) tablet 650 mg (not administered)    Or  acetaminophen (TYLENOL) suppository 650 mg (not administered)  ondansetron (ZOFRAN) tablet 4 mg (not administered)    Or  ondansetron (ZOFRAN) injection 4 mg (not administered)  sodium chloride 0.45 % 1,000 mL with potassium chloride 20 mEq infusion ( Intravenous New Bag/Given 02/20/17 2158)  pantoprazole (PROTONIX) EC tablet 40 mg (40 mg Oral Given 02/21/17 1158)  vitamin B-12 (CYANOCOBALAMIN) tablet 500 mcg (1,000 mcg Oral Given 02/21/17 1157)  ceFEPIme (MAXIPIME) 1 g in dextrose 5 % 50 mL IVPB (1 g Intravenous New Bag/Given 02/21/17 0528)  vancomycin (VANCOCIN) IVPB 1000 mg/200 mL premix (not administered)  collagenase (SANTYL) ointment (not administered)  pantoprazole (PROTONIX) injection 40 mg (40 mg Intravenous Given 02/20/17 1559)  0.9 %  sodium chloride infusion (250 mLs Intravenous New Bag/Given 02/20/17 2322)  ceFEPIme (MAXIPIME) 2 g in dextrose 5 % 50 mL IVPB (0 g Intravenous Stopped 02/20/17 2226)  vancomycin (VANCOCIN) 1,500 mg in sodium chloride 0.9 % 500 mL IVPB (0 mg Intravenous Stopped 02/21/17 0058)  acetaminophen (TYLENOL) tablet 650 mg (650 mg Oral Given 02/20/17 2157)     Initial Impression / Assessment and Plan / ED Course  I have reviewed the triage vital signs and the nursing notes.  Pertinent labs & imaging results that were available during my care of the patient were reviewed by me and considered in my medical decision making (see chart for details).     DEAVIN FORST is a 76 y.o. male with a past medical history significant for asthma, hypertension, dementia, frequent falls, and multiple pressure ulcers on aspirin who presents with generalized fatigue, wounds becoming  foul-smelling, and blood work at rehabilitation facility showing anemia. Patient is accompanied by  wife who says that patient has been feeling fatigued for the last few days. She denies patient having any other symptoms aside from generalized weakness and the wound on his sacrum he coming more foul-smelling. Patient is only alert and oriented to himself. Patient denies any complaints other than his fatigue and generalized aching.  Wife says the patient has not had change in urination, change in bowel movements, respiratory symptoms, chest pain, or shortness of breath.  According to documentation coming patient, patient's hemoglobin is 5.3.  On my exam, patient has multiple areas of pressure ulcers on the right heel, left hip, and sacrum. The left hip and right heel appeared well-healing and superficial. The patient's wound on his sacrum was packed today and was foul-smelling with some purulence but no significant surrounding erythema. Lungs were clear and abdomen was nontender. Patient had generalized swelling in both lower extremity is which the wife says may be slightly creased from prior.  Given the patient's anemia, patient will be typed and screened. Patient repeat blood testing. Patient also have workup to look for occult infection. Anticipate admission for symptomatically anemia with his worsened fatigue.  4:25 PM Heme occult test positive. Hgb returned at 5.8 down from 11.6 2 weeks ago.   Pt given Protonix and one unit of blood ordered.   Gi will be called and pt will be admitted for symptomatic anemia and GI bleed. WIll defer to admitting team as to decision for antibiotics for sacral wound. PT admitted for further management of GI bleed and anemia.    Final Clinical Impressions(s) / ED Diagnoses   Final diagnoses:  Gastrointestinal hemorrhage, unspecified gastrointestinal hemorrhage type  Symptomatic anemia  Fecal occult blood test positive    Clinical Impression: 1. Gastrointestinal hemorrhage, unspecified gastrointestinal hemorrhage type   2. Symptomatic anemia   3. Fecal  occult blood test positive   4. Sacral decubitus ulcer, stage IV (HCC)     Disposition: Admit to Hospitalist service    Heide Scales, MD 02/21/17 1229

## 2017-02-20 NOTE — ED Notes (Signed)
Hospitalist at bedside 

## 2017-02-20 NOTE — ED Notes (Signed)
Bed: EA54 Expected date:  Expected time:  Means of arrival:  Comments: EMS Low hgb

## 2017-02-20 NOTE — ED Notes (Signed)
Patient has stage 2 pressure ulcer on right heel.    Patient has 2 healing stage 2 areas on left hip area.  Patient has large pressure sore on bottom that has packing in it according to Dr Rush Landmark who assessed wound while this RN assisted patient in side laying position.

## 2017-02-20 NOTE — ED Triage Notes (Signed)
[facility] brings patient from [facility] for low Hgb. Patient had blood work done today HGB 5.3, HCT 16.1.  Pt not ambulatory. A&Ox3, dementia. Patient's wife wants EDP to assess pressure sores that patient has on bottom.   Vitals: 114/55, 90HR, CBG 213, 100% on RA, 28RR.

## 2017-02-21 ENCOUNTER — Inpatient Hospital Stay (HOSPITAL_COMMUNITY): Payer: Medicare Other

## 2017-02-21 DIAGNOSIS — K922 Gastrointestinal hemorrhage, unspecified: Principal | ICD-10-CM

## 2017-02-21 LAB — CBC
HEMATOCRIT: 25.1 % — AB (ref 39.0–52.0)
Hemoglobin: 8.5 g/dL — ABNORMAL LOW (ref 13.0–17.0)
MCH: 31 pg (ref 26.0–34.0)
MCHC: 33.9 g/dL (ref 30.0–36.0)
MCV: 91.6 fL (ref 78.0–100.0)
Platelets: 264 10*3/uL (ref 150–400)
RBC: 2.74 MIL/uL — ABNORMAL LOW (ref 4.22–5.81)
RDW: 14.5 % (ref 11.5–15.5)
WBC: 10.3 10*3/uL (ref 4.0–10.5)

## 2017-02-21 LAB — BASIC METABOLIC PANEL
ANION GAP: 10 (ref 5–15)
BUN: 23 mg/dL — AB (ref 6–20)
CHLORIDE: 110 mmol/L (ref 101–111)
CO2: 24 mmol/L (ref 22–32)
Calcium: 8 mg/dL — ABNORMAL LOW (ref 8.9–10.3)
Creatinine, Ser: 0.91 mg/dL (ref 0.61–1.24)
GFR calc Af Amer: >60 mL/min (ref >60)
GFR calc non Af Amer: >60 mL/min (ref >60)
GLUCOSE: 95 mg/dL (ref 65–99)
POTASSIUM: 3.5 mmol/L (ref 3.5–5.1)
SODIUM: 144 mmol/L (ref 135–145)

## 2017-02-21 LAB — URINALYSIS, ROUTINE W REFLEX MICROSCOPIC
Bilirubin Urine: NEGATIVE
Glucose, UA: NEGATIVE mg/dL
Hgb urine dipstick: NEGATIVE
Ketones, ur: NEGATIVE mg/dL
LEUKOCYTES UA: NEGATIVE
NITRITE: NEGATIVE
PH: 5 (ref 5.0–8.0)
Protein, ur: NEGATIVE mg/dL
SPECIFIC GRAVITY, URINE: 1.024 (ref 1.005–1.030)

## 2017-02-21 LAB — LACTIC ACID, PLASMA
LACTIC ACID, VENOUS: 1.6 mmol/L (ref 0.5–1.9)
Lactic Acid, Venous: 1.8 mmol/L (ref 0.5–1.9)

## 2017-02-21 LAB — MRSA PCR SCREENING: MRSA BY PCR: NEGATIVE

## 2017-02-21 LAB — SEDIMENTATION RATE: SED RATE: 60 mm/h — AB (ref 0–16)

## 2017-02-21 MED ORDER — COLLAGENASE 250 UNIT/GM EX OINT
TOPICAL_OINTMENT | Freq: Every day | CUTANEOUS | Status: DC
  Administered 2017-02-22 – 2017-02-24 (×3): via TOPICAL
  Filled 2017-02-21: qty 30

## 2017-02-21 MED ORDER — POTASSIUM CHLORIDE IN NACL 20-0.45 MEQ/L-% IV SOLN
INTRAVENOUS | Status: DC
  Administered 2017-02-21 – 2017-02-26 (×5): via INTRAVENOUS
  Administered 2017-02-26: 1000 mL via INTRAVENOUS
  Filled 2017-02-21 (×9): qty 1000

## 2017-02-21 NOTE — Progress Notes (Signed)
3 units of blood total.No signs of reaction to transfusions.wbb

## 2017-02-21 NOTE — Progress Notes (Signed)
Palliative Medicine Team consult was received.  I called and spoke with his wife.  We will schedule a meeting with patient and his family tomorrow once his son arrives from out of town.  His wife will call in the AM with time.   If there are urgent needs or questions please call [phone]. Thank you for consulting out team to assist with this patients care.  Romie Minus, MD [facility] Palliative Medicine Team [phone]  NO CHARGE NOTE

## 2017-02-21 NOTE — Consult Note (Signed)
Referring Provider:  Catarina Hartshorn, MD Primary Care Physician:  Evlyn Courier, MD Primary Gastroenterologist:  None (unassigned)  Reason for Consultation:  GI bleeding  HPI:  The history is obtained from the emergency room physician and from the patient's wife on the telephone, since the patient himself has dementia.  Andre Ball is a 76 y.o. male admitted to the hospital yesterday from the nursing home after the staff noted that he was somewhat weak and tired and he was found to have a hemoglobin that was around 5, as compared to 11.6 at time of discharge from the hospital for rhabdomyolysis approximately 3 weeks ago.  On admission here, hemoglobin was 5.8, MCV 96, BUN had risen to 27 as compared to 16 at time of last visit, and rectal exam showed heme-positive stool that was brown in color, not frankly melenic.  At the nursing home, the patient was on a daily 81 mg aspirin, and was not on any PPI therapy.  Overnight, he has received a blood transfusion which is now complete. However, morning labs have not yet been obtained so his posttransfusion hemoglobin and the trend of his BUN are not currently available.   Past Medical History:  Diagnosis Date  . Arthritis   . Asthma   . BPH (benign prostatic hyperplasia)   . Memory deficits 11/01/2014    Past Surgical History:  Procedure Laterality Date  . TONSILLECTOMY      Prior to Admission medications   Medication Sig Start Date End Date Taking? Authorizing Provider  aspirin EC 81 MG tablet Take 81 mg by mouth daily.   Yes Historical Provider, MD  collagenase (SANTYL) ointment Apply 1 application topically daily. Patient taking differently: Apply 1 application topically every evening. Pt applies to right heel and sacrum. 02/04/17  Yes Lenox Ponds, MD  diclofenac sodium (VOLTAREN) 1 % GEL Apply 1 application topically daily as needed (for leg/shoulder pain).    Yes Historical Provider, MD  donepezil (ARICEPT) 10 MG tablet Take 1  tablet (10 mg total) by mouth at bedtime. 08/20/16  Yes York Spaniel, MD  feeding supplement, ENSURE ENLIVE, (ENSURE ENLIVE) LIQD Take 237 mLs by mouth 2 (two) times daily between meals. 02/04/17  Yes Lenox Ponds, MD  finasteride (PROSCAR) 5 MG tablet Take 5 mg by mouth daily.   Yes Historical Provider, MD  fluticasone (FLONASE) 50 MCG/ACT nasal spray Place 2 sprays into the nose daily. 01/30/13  Yes Reuben Likes, MD  lisinopril (PRINIVIL,ZESTRIL) 10 MG tablet Take 10 mg by mouth daily.   Yes Historical Provider, MD  memantine (NAMENDA) 10 MG tablet Take 1 tablet (10 mg total) by mouth 2 (two) times daily. 08/20/16  Yes York Spaniel, MD  montelukast (SINGULAIR) 10 MG tablet Take 10 mg by mouth at bedtime.   Yes Historical Provider, MD  Multiple Vitamin (MULTIVITAMIN WITH MINERALS) TABS tablet Take 1 tablet by mouth daily.   Yes Historical Provider, MD  nitroGLYCERIN (NITROSTAT) 0.4 MG SL tablet Place 0.4 mg under the tongue every 5 (five) minutes as needed for chest pain.   Yes Historical Provider, MD  omega-3 acid ethyl esters (LOVAZA) 1 g capsule Take 1 g by mouth daily.   Yes Historical Provider, MD  oxaprozin (DAYPRO) 600 MG tablet Take 1 tablet (600 mg total) by mouth 2 (two) times daily. 11/13/16  Yes Helane Gunther, DPM  oxyCODONE-acetaminophen (PERCOCET/ROXICET) 5-325 MG tablet Take 1 tablet by mouth every 6 (six) hours as needed for moderate  pain. 02/04/17  Yes Lenox Ponds, MD  risperiDONE (RISPERDAL) 1 MG tablet Take 1 mg by mouth 2 (two) times daily.   Yes Historical Provider, MD  sertraline (ZOLOFT) 25 MG tablet Take 25 mg by mouth daily.   Yes Historical Provider, MD  sodium hypochlorite (DAKIN'S 1/4 STRENGTH) 0.125 % SOLN Apply 1 application topically 2 (two) times daily. Pt applies to sacrum.   Yes Historical Provider, MD  tamsulosin (FLOMAX) 0.4 MG CAPS Take 0.4 mg by mouth daily.   Yes Historical Provider, MD  vitamin B-12 (CYANOCOBALAMIN) 500 MCG tablet Take 500 mcg  by mouth daily.   Yes Historical Provider, MD    Current Facility-Administered Medications  Medication Dose Route Frequency Provider Last Rate Last Dose  . 0.9 %  sodium chloride infusion   Intravenous Once Heide Scales, MD      . acetaminophen (TYLENOL) tablet 650 mg  650 mg Oral Q6H PRN Catarina Hartshorn, MD       Or  . acetaminophen (TYLENOL) suppository 650 mg  650 mg Rectal Q6H PRN Catarina Hartshorn, MD      . ceFEPIme (MAXIPIME) 1 g in dextrose 5 % 50 mL IVPB  1 g Intravenous Q8H Terri L Green, RPH 100 mL/hr at 02/21/17 0528 1 g at 02/21/17 0528  . donepezil (ARICEPT) tablet 10 mg  10 mg Oral QHS Catarina Hartshorn, MD   10 mg at 02/21/17 0040  . feeding supplement (ENSURE ENLIVE) (ENSURE ENLIVE) liquid 237 mL  237 mL Oral BID BM Catarina Hartshorn, MD      . finasteride (PROSCAR) tablet 5 mg  5 mg Oral Daily Onalee Hua Tat, MD      . fluticasone (FLONASE) 50 MCG/ACT nasal spray 2 spray  2 spray Each Nare Daily David Tat, MD      . memantine [facility]) tablet 10 mg  10 mg Oral BID Catarina Hartshorn, MD   10 mg at 02/21/17 0039  . montelukast (SINGULAIR) tablet 10 mg  10 mg Oral QHS Catarina Hartshorn, MD   10 mg at 02/21/17 0040  . multivitamin with minerals tablet 1 tablet  1 tablet Oral Daily Catarina Hartshorn, MD      . nitroGLYCERIN (NITROSTAT) SL tablet 0.4 mg  0.4 mg Sublingual Q5 min PRN Catarina Hartshorn, MD      . omega-3 acid ethyl esters (LOVAZA) capsule 1 g  1 g Oral Daily Onalee Hua Tat, MD      . ondansetron [facility]) tablet 4 mg  4 mg Oral Q6H PRN Catarina Hartshorn, MD       Or  . ondansetron (ZOFRAN) injection 4 mg  4 mg Intravenous Q6H PRN Catarina Hartshorn, MD      . oxyCODONE-acetaminophen (PERCOCET/ROXICET) 5-325 MG per tablet 1 tablet  1 tablet Oral Q6H PRN Catarina Hartshorn, MD      . pantoprazole (PROTONIX) EC tablet 40 mg  40 mg Oral BID AC Bernette Redbird, MD      . risperiDONE (RISPERDAL) tablet 1 mg  1 mg Oral BID Catarina Hartshorn, MD   1 mg at 02/21/17 0042  . sertraline (ZOLOFT) tablet 25 mg  25 mg Oral Daily Onalee Hua Tat, MD      . sodium chloride 0.45 %  1,000 mL with potassium chloride 20 mEq infusion   Intravenous Continuous Catarina Hartshorn, MD 75 mL/hr at 02/20/17 2158    . tamsulosin (FLOMAX) capsule 0.4 mg  0.4 mg Oral Daily Catarina Hartshorn, MD      . vancomycin (VANCOCIN) IVPB  1000 mg/200 mL premix  1,000 mg Intravenous Q12H Otho Bellows, RPH      . vitamin B-12 (CYANOCOBALAMIN) tablet 500 mcg  500 mcg Oral Daily Catarina Hartshorn, MD        Allergies as of 02/20/2017  . (No Known Allergies)    Family History  Problem Relation Age of Onset  . Alzheimer's disease Mother   . Alzheimer's disease Father   . Diabetes Brother   . Alzheimer's disease Sister   . Heart disease Brother   . Diabetes Brother   . Alzheimer's disease Sister   . Alzheimer's disease Brother   . Alzheimer's disease Brother     Social History   Social History  . Marital status: Married    Spouse name: N/A  . Number of children: 2  . Years of education: college de   Occupational History  . Retired    Social History Main Topics  . Smoking status: Former Games developer  . Smokeless tobacco: Never Used  . Alcohol use No     Comment: occasionally  . Drug use: No  . Sexual activity: Not on file     Comment: Married   Other Topics Concern  . Not on file   Social History Narrative   Lives at home w/ his wife   Patient is right handed.   Patient does not drink caffeine.    Review of Systems: Not obtainable in this demented patient  Physical Exam: Vital signs in last 24 hours: Temp:  [97.7 F (36.5 C)-100.9 F (38.3 C)] 97.7 F (36.5 C) (04/26 0510) Pulse Rate:  [66-92] 66 (04/26 0510) Resp:  [17-24] 20 (04/26 0510) BP: (109-138)/(49-79) 123/59 (04/26 0510) SpO2:  [92 %-100 %] 100 % (04/26 0510) Weight:  [73 kg (161 lb)] 73 kg (161 lb) (04/25 1856) Last BM Date: 02/20/17  This is an elderly but reasonably well-nourished appearing African-American male, awake and alert, but demented, with mittens on, not really answering questions appropriately. He is anicteric and  without frank pallor at this time. The skin is warm. The chest is clear, the heart is without murmur or arrhythmia, and the abdomen is without guarding, mass effect, or tenderness.  Intake/Output from previous day: 04/25 0701 - 04/26 0700 In: 1700.5 [P.O.:120; I.V.:600; Blood:980.5] Out: 300 [Urine:300] Intake/Output this shift: No intake/output data recorded.  Lab Results:  Recent Labs  02/20/17 1535  WBC 10.9*  HGB 5.8*  HCT 17.2*  PLT 324   BMET  Recent Labs  02/20/17 1535  NA 145  K 3.7  CL 110  CO2 24  GLUCOSE 154*  BUN 27*  CREATININE 1.07  CALCIUM 8.5*   LFT  Recent Labs  02/20/17 1535  PROT 6.7  ALBUMIN 2.7*  AST 53*  ALT 33  ALKPHOS 62  BILITOT 0.2*   PT/INR  Recent Labs  02/20/17 1535  LABPROT 14.3  INR 1.11    Studies/Results: Dg Chest 2 View  Result Date: 02/20/2017 CLINICAL DATA:  Fatigue.  Dementia. EXAM: CHEST  2 VIEW COMPARISON:  One-view chest x-ray 01/31/2017. Two-view chest x-ray 01/16/2017. FINDINGS: The heart is mildly enlarged. Lung volumes are low. There is no edema. There is some blunting of the CP angles posteriorly on both sides. Degenerative changes are again seen in the thoracic spine. IMPRESSION: 1. Blunting of posterior CP angle bilaterally appears chronic. No definite effusions. 2. Otherwise negative two view chest x-ray. Electronically Signed   By: Marin Roberts M.D.   On: 02/20/2017 15:24  Impression: This appears to be a subacute GI bleed, most likely from aspirin-induced gastropathy.  Plan: I spoke on the phone yesterday for 20 minutes with the patient's wife, going over the option of endoscopic evaluation versus empiric therapy with observation. The pros and cons of each course of action were discussed.  Following that discussion, it was the patient's wife's opinion, as well as my own, that this elderly, frail, demented patient with decubitus ulcerations would be best served by observation in view of his  overall stability and the relatively low likelihood that endoscopic evaluation would significantly alter his clinical management.  Accordingly, I have ordered PPI therapy, and I will follow the patient with you. If the patient were to have destabilizing acute bleeding, recurrent hemorrhage, or refractory anemia, we might consider doing endoscopy under those circumstances.  Recommendation:  1. High-dose PPI therapy (ordered; I ordered it as oral, but if patient unable to take reliable oral intake, would switch to IV)  2. Hold aspirin for at least a week, preferably 2-4 weeks.  3. Daily CBC  4. Discharge would probably be appropriate from GI bleeding standpoint if and when the patient has a stable hemoglobin for 2 or 3 consecutive days.  5. I would favor twice-daily PPI therapy for a month, and then cut back to once daily dosing which I would probably continue indefinitely, especially if it is decided to put the patient back on aspirin (which I feel would be acceptable in terms of recurrent GI bleeding risk, as long as the patient is maintained on PPI prophylaxis).  6. Please call me if you have questions or would like to discuss this patient's case in more detail.   LOS: 1 day   Gaberial Cada V  02/21/2017, 8:51 AM   Pager [phone] If no answer or after 5 PM call [phone]

## 2017-02-21 NOTE — Progress Notes (Signed)
Nursing Note: Temp 100.9.Pt due to get 2 more units of blood.A; Paged on-call and order to give tylenol and to give blood.wbb

## 2017-02-21 NOTE — Consult Note (Signed)
WOC Nurse wound consult note Reason for Consult: pressure injury Patient from SNF with pressure injuries POA to the sacrum, hip and heels per notes Wound type: Unstageable pressure injury; sacrum; 4cm x 4.5cm x 3.5cm;100% black/grey non viable tissue Stage 3 pressure injury: left heel: 2.5cm x 3.0cm x 0.2cm; 100% pink, clean, moist Stage 2 (2) left heel: distal-1cm x 2.0cm x 0.1cm and proximal-1cm x 3cm x 0.1cm; both 100% clean, pink, dry Pressure Injury POA: Yes x 4 Measurement:see above Wound bed: see above Drainage (amount, consistency, odor) purulent, foul odor, moderate drainage from the sacrum.  The other sites minimal, serosanguinous  Periwound: intact  Dressing procedure/placement/frequency: Add low air loss mattress for pressure redistribution. Add Prevalon boots bilaterally for offloading the heels Add enzymatic debridement with hydrotherapy after surgical evaluation if not a surgery candidate Silicone foam to the left hip and left heel.  RD for maximization of the patient's diet for wound healing.   Requested Diplomatic Services operational officer to order [employer] for patient. Requested bedside nurse to contact PT For hydrotherapy after surgical consultation if no surgical intervention.   Discussed POC with  bedside nurse.  Re consult if needed, will not follow at this time. Thanks  Dionel Archey [employer], RN,CWOCN, CNS [phone])

## 2017-02-21 NOTE — Clinical Social Work Note (Signed)
Clinical Social Work Assessment  Patient Details  Name: Andre Ball MRN: [id number] Date of Birth: May 19, 1941  Date of referral:  02/21/17               Reason for consult:  Other (Comment Required), Facility Placement (pt from SNF)                Permission sought to share information with:  Facility Sport and exercise psychologist, Family Supports Permission granted to share information::  Yes, Verbal Permission Granted  Name::        Agency::  [facility]  Relationship::  Wife [name] [phone]  Contact Information:     Housing/Transportation Living arrangements for the past 2 months:  [address] (prior to recent SNF placement was residing at home with family) Source of Information:  Facility, Spouse Patient Interpreter Needed:  None Criminal Activity/Legal Involvement Pertinent to Current Situation/Hospitalization:  No - Comment as needed Significant Relationships:  Adult Children, Spouse Lives with:  Facility Resident Do you feel safe going back to the place where you live?  Yes Need for family participation in patient care:  Yes (Comment) (Wife primary decision maker, pt with dementia)  Care giving concerns:  Pt from [facility], was placed at dc from [facility] 02/04/17. Per wife, pt has had good experience there and she is hopeful he can return at dc. Wife reports family feels this will become long term care. Pt has dementia and wife reports at baseline has come to require more assistance than family can provide.  Per wife, they have applied for Medicaid with DSS, next step is to "get a lawyer and find out how we can liquidate his assets to meet the Medicaid threshold."   Social Worker assessment / plan:  CSW consulted as pt is from SNF at [facility] ([place], wife hopeful for eventual LT SNF).  Met with pt at bedside. Pt drowsy but responsive and calm. Both he and wife hopeful to return to [facility].   Spoke with Juliann Pulse at [facility] and will continue  following for DC planning.  Completed FL2, will update at DC if needed.  Employment status:  Retired Forensic scientist:  Medicare PT Recommendations:  Not assessed at this time [place] / Referral to community resources:  [place]  Patient/Family's Response to care:  Pt drowsy and not oriented to situation but is pleasant. Wife appreciative of care.   Patient/Family's Understanding of and Emotional Response to Diagnosis, Current Treatment, and Prognosis:  Wife demonstrates adequate understanding of plan. Expresses some anxiety about their process of applying for medicaid and hoping pt will be able to pay for LT care eventually. Also states she is appreciative of care pt has received at [facility] and hopeful he will return at DC.  Emotional Assessment Appearance:  Appears stated age Attitude/Demeanor/Rapport:   (appropriate) Affect (typically observed):  Calm Orientation:  Oriented to Self, Oriented to Place Alcohol / Substance use:  Not Applicable Psych involvement (Current and /or in the community):  No (Comment)  Discharge Needs  Concerns to be addressed:  Discharge Planning Concerns Readmission within the last 30 days:  Yes Current discharge risk:  None Barriers to Discharge:  Continued Medical Work up   [employer], LCSW 02/21/2017, 1:39 PM

## 2017-02-21 NOTE — Consult Note (Signed)
Reason for Consult:  sacral decubitus Referring Physician: D Tat  Andre Ball is an 76 y.o. male.  HPI: Pt seen back in 07/2016 by Dr/ Jannifer [facility], with memory issues, but still driving.  Driving was discontinued at that time, he was already on Namenda, and Aricept.    He was most recently hospitalized from 4/5-02/04/17 with fall and Rhabdomyolysis, deconditioning,Progressive dementia, hypertension and Stage 2 pressure sores right foot and stage III sacral decubitus.  He is brought to the ED from [facility] for fatigue with H/H = 5.3/16. No source of bleeding could be determined   He is no longer ambulatory per the nursing note and he has pressure sore on his sacrum that is stage 4, with concerns for sepsis/osteomyelitis and abscess.   Work up so far shows low grade fever 100.9 last PM after admit, VSS. CMP OK glucose is up someBNP is low, WBC 10.9, H/H:  5.8/17.2.  Fecal occult is positive.  Pt has been transfuse 3 units and  H/H is up to 8.5/26.  We are ask to see for the decubitus.      Past Medical History:  Diagnosis Date  . Arthritis   . Asthma   . BPH (benign prostatic hyperplasia)   . Memory deficits 11/01/2014    Past Surgical History:  Procedure Laterality Date  . TONSILLECTOMY      Family History  Problem Relation Age of Onset  . Alzheimer's disease Mother   . Alzheimer's disease Father   . Diabetes Brother   . Alzheimer's disease Sister   . Heart disease Brother   . Diabetes Brother   . Alzheimer's disease Sister   . Alzheimer's disease Brother   . Alzheimer's disease Brother     Social History:  reports that he has quit smoking. He has never used smokeless tobacco. He reports that he does not drink alcohol or use drugs.  Allergies: No Known Allergies  Prior to Admission medications   Medication Sig Start Date End Date Taking? Authorizing Provider  aspirin EC 81 MG tablet Take 81 mg by mouth daily.   Yes Historical Provider, MD    collagenase (SANTYL) ointment Apply 1 application topically daily. Patient taking differently: Apply 1 application topically every evening. Pt applies to right heel and sacrum. 02/04/17  Yes Doreatha Lew, MD  diclofenac sodium (VOLTAREN) 1 % GEL Apply 1 application topically daily as needed (for leg/shoulder pain).    Yes Historical Provider, MD  donepezil (ARICEPT) 10 MG tablet Take 1 tablet (10 mg total) by mouth at bedtime. 08/20/16  Yes Kathrynn Ducking, MD  feeding supplement, ENSURE ENLIVE, (ENSURE ENLIVE) LIQD Take 237 mLs by mouth 2 (two) times daily between meals. 02/04/17  Yes Doreatha Lew, MD  finasteride (PROSCAR) 5 MG tablet Take 5 mg by mouth daily.   Yes Historical Provider, MD  fluticasone (FLONASE) 50 MCG/ACT nasal spray Place 2 sprays into the nose daily. 01/30/13  Yes Harden Mo, MD  lisinopril (PRINIVIL,ZESTRIL) 10 MG tablet Take 10 mg by mouth daily.   Yes Historical Provider, MD  memantine (NAMENDA) 10 MG tablet Take 1 tablet (10 mg total) by mouth 2 (two) times daily. 08/20/16  Yes Kathrynn Ducking, MD  montelukast (SINGULAIR) 10 MG tablet Take 10 mg by mouth at bedtime.   Yes Historical Provider, MD  Multiple Vitamin (MULTIVITAMIN WITH MINERALS) TABS tablet Take 1 tablet by mouth daily.   Yes Historical Provider, MD  nitroGLYCERIN (NITROSTAT) 0.4 MG SL  tablet Place 0.4 mg under the tongue every 5 (five) minutes as needed for chest pain.   Yes Historical Provider, MD  omega-3 acid ethyl esters (LOVAZA) 1 g capsule Take 1 g by mouth daily.   Yes Historical Provider, MD  oxaprozin (DAYPRO) 600 MG tablet Take 1 tablet (600 mg total) by mouth 2 (two) times daily. 11/13/16  Yes Gardiner Barefoot, DPM  oxyCODONE-acetaminophen (PERCOCET/ROXICET) 5-325 MG tablet Take 1 tablet by mouth every 6 (six) hours as needed for moderate pain. 02/04/17  Yes Doreatha Lew, MD  risperiDONE (RISPERDAL) 1 MG tablet Take 1 mg by mouth 2 (two) times daily.   Yes Historical Provider, MD   sertraline (ZOLOFT) 25 MG tablet Take 25 mg by mouth daily.   Yes Historical Provider, MD  sodium hypochlorite (DAKIN'S 1/4 STRENGTH) 0.125 % SOLN Apply 1 application topically 2 (two) times daily. Pt applies to sacrum.   Yes Historical Provider, MD  tamsulosin (FLOMAX) 0.4 MG CAPS Take 0.4 mg by mouth daily.   Yes Historical Provider, MD  vitamin B-12 (CYANOCOBALAMIN) 500 MCG tablet Take 500 mcg by mouth daily.   Yes Historical Provider, MD     Results for orders placed or performed during the hospital encounter of 02/20/17 (from the past 48 hour(s))  CBC with Differential     Status: Abnormal   Collection Time: 02/20/17  3:35 PM  Result Value Ref Range   WBC 10.9 (H) 4.0 - 10.5 K/uL   RBC 1.80 (L) 4.22 - 5.81 MIL/uL   Hemoglobin 5.8 (LL) 13.0 - 17.0 g/dL    Comment: REPEATED TO VERIFY CRITICAL RESULT CALLED TO, READ BACK BY AND VERIFIED WITH: K.Red [place] RM AT 5284 ON 02/20/17 BY S.VANHOORNE    HCT 17.2 (L) 39.0 - 52.0 %   MCV 95.6 78.0 - 100.0 fL   MCH 32.2 26.0 - 34.0 pg   MCHC 33.7 30.0 - 36.0 g/dL   RDW 14.8 11.5 - 15.5 %   Platelets 324 150 - 400 K/uL   Neutrophils Relative % 89 %   Lymphocytes Relative 6 %   Monocytes Relative 5 %   Eosinophils Relative 0 %   Basophils Relative 0 %   Neutro Abs 9.7 (H) 1.7 - 7.7 K/uL   Lymphs Abs 0.7 0.7 - 4.0 K/uL   Monocytes Absolute 0.5 0.1 - 1.0 K/uL   Eosinophils Absolute 0.0 0.0 - 0.7 K/uL   Basophils Absolute 0.0 0.0 - 0.1 K/uL   RBC Morphology POLYCHROMASIA PRESENT   Comprehensive metabolic panel     Status: Abnormal   Collection Time: 02/20/17  3:35 PM  Result Value Ref Range   Sodium 145 135 - 145 mmol/L   Potassium 3.7 3.5 - 5.1 mmol/L   Chloride 110 101 - 111 mmol/L   CO2 24 22 - 32 mmol/L   Glucose, Bld 154 (H) 65 - 99 mg/dL   BUN 27 (H) 6 - 20 mg/dL   Creatinine, Ser 1.07 0.61 - 1.24 mg/dL   Calcium 8.5 (L) 8.9 - 10.3 mg/dL   Total Protein 6.7 6.5 - 8.1 g/dL   Albumin 2.7 (L) 3.5 - 5.0 g/dL   AST 53 (H) 15 - 41 U/L    ALT 33 17 - 63 U/L   Alkaline Phosphatase 62 38 - 126 U/L   Total Bilirubin 0.2 (L) 0.3 - 1.2 mg/dL   GFR calc non Af Amer >60 >60 mL/min   GFR calc Af Amer >60 >60 mL/min    Comment: (NOTE) The  eGFR has been calculated using the CKD EPI equation. This calculation has not been validated in all clinical situations. eGFR's persistently <60 mL/min signify possible Chronic Kidney Disease.    Anion gap 11 5 - 15  Protime-INR     Status: None   Collection Time: 02/20/17  3:35 PM  Result Value Ref Range   Prothrombin Time 14.3 11.4 - 15.2 seconds   INR 1.11   Brain natriuretic peptide     Status: Abnormal   Collection Time: 02/20/17  3:35 PM  Result Value Ref Range   B Natriuretic Peptide 176.8 (H) 0.0 - 100.0 pg/mL  Type and screen [place]     Status: None (Preliminary result)   Collection Time: 02/20/17  3:35 PM  Result Value Ref Range   ABO/RH(D) O POS    Antibody Screen NEG    Sample Expiration 02/23/2017    Unit Number [id number]    Blood Component Type RED CELLS,LR    Unit division 00    Status of Unit ISSUED,FINAL    Transfusion Status OK TO TRANSFUSE    Crossmatch Result Compatible    Unit Number [id number]    Blood Component Type RED CELLS,LR    Unit division 00    Status of Unit ISSUED    Transfusion Status OK TO TRANSFUSE    Crossmatch Result Compatible    Unit Number [id number]    Blood Component Type RED CELLS,LR    Unit division 00    Status of Unit ALLOCATED    Transfusion Status OK TO TRANSFUSE    Crossmatch Result Compatible    Unit Number [id number]    Blood Component Type RED CELLS,LR    Unit division 00    Status of Unit ISSUED,FINAL    Transfusion Status OK TO TRANSFUSE    Crossmatch Result Compatible   ABO/Rh     Status: None   Collection Time: 02/20/17  3:40 PM  Result Value Ref Range   ABO/RH(D) O POS   I-Stat CG4 Lactic Acid, ED     Status: Abnormal   Collection Time: 02/20/17  3:47 PM  Result Value Ref  Range   Lactic Acid, Venous 2.81 (HH) 0.5 - 1.9 mmol/L   Comment NOTIFIED PHYSICIAN   POC occult blood, ED Provider will collect     Status: Abnormal   Collection Time: 02/20/17  3:47 PM  Result Value Ref Range   Fecal Occult Bld POSITIVE (A) NEGATIVE  Prepare RBC     Status: None   Collection Time: 02/20/17  4:30 PM  Result Value Ref Range   Order Confirmation ORDER PROCESSED BY BLOOD BANK   Prepare RBC     Status: None   Collection Time: 02/20/17  8:00 PM  Result Value Ref Range   Order Confirmation ORDER PROCESSED BY BLOOD BANK   Urinalysis, Routine w reflex microscopic     Status: Abnormal   Collection Time: 02/21/17  1:25 AM  Result Value Ref Range   Color, Urine YELLOW YELLOW   APPearance HAZY (A) CLEAR   Specific Gravity, Urine 1.024 1.005 - 1.030   pH 5.0 5.0 - 8.0   Glucose, UA NEGATIVE NEGATIVE mg/dL   Hgb urine dipstick NEGATIVE NEGATIVE   Bilirubin Urine NEGATIVE NEGATIVE   Ketones, ur NEGATIVE NEGATIVE mg/dL   Protein, ur NEGATIVE NEGATIVE mg/dL   Nitrite NEGATIVE NEGATIVE   Leukocytes, UA NEGATIVE NEGATIVE  Basic metabolic panel     Status: Abnormal   Collection Time: 02/21/17  8:34 AM  Result Value Ref Range   Sodium 144 135 - 145 mmol/L   Potassium 3.5 3.5 - 5.1 mmol/L   Chloride 110 101 - 111 mmol/L   CO2 24 22 - 32 mmol/L   Glucose, Bld 95 65 - 99 mg/dL   BUN 23 (H) 6 - 20 mg/dL   Creatinine, Ser 0.91 0.61 - 1.24 mg/dL   Calcium 8.0 (L) 8.9 - 10.3 mg/dL   GFR calc non Af Amer >60 >60 mL/min   GFR calc Af Amer >60 >60 mL/min    Comment: (NOTE) The eGFR has been calculated using the CKD EPI equation. This calculation has not been validated in all clinical situations. eGFR's persistently <60 mL/min signify possible Chronic Kidney Disease.    Anion gap 10 5 - 15  CBC     Status: Abnormal   Collection Time: 02/21/17  8:34 AM  Result Value Ref Range   WBC 10.3 4.0 - 10.5 K/uL   RBC 2.74 (L) 4.22 - 5.81 MIL/uL   Hemoglobin 8.5 (L) 13.0 - 17.0 g/dL     Comment: DELTA CHECK NOTED POST TRANSFUSION SPECIMEN    HCT 25.1 (L) 39.0 - 52.0 %   MCV 91.6 78.0 - 100.0 fL   MCH 31.0 26.0 - 34.0 pg   MCHC 33.9 30.0 - 36.0 g/dL   RDW 14.5 11.5 - 15.5 %   Platelets 264 150 - 400 K/uL  Lactic acid, plasma     Status: None   Collection Time: 02/21/17  8:34 AM  Result Value Ref Range   Lactic Acid, Venous 1.8 0.5 - 1.9 mmol/L  Sedimentation rate     Status: Abnormal   Collection Time: 02/21/17  8:34 AM  Result Value Ref Range   Sed Rate 60 (H) 0 - 16 mm/hr    Dg Chest 2 View  Result Date: 02/20/2017 CLINICAL DATA:  Fatigue.  Dementia. EXAM: CHEST  2 VIEW COMPARISON:  One-view chest x-ray 01/31/2017. Two-view chest x-ray 01/16/2017. FINDINGS: The heart is mildly enlarged. Lung volumes are low. There is no edema. There is some blunting of the CP angles posteriorly on both sides. Degenerative changes are again seen in the thoracic spine. IMPRESSION: 1. Blunting of posterior CP angle bilaterally appears chronic. No definite effusions. 2. Otherwise negative two view chest x-ray. Electronically Signed   By: San Morelle M.D.   On: 02/20/2017 15:24    Review of Systems  Unable to perform ROS: Dementia   Blood pressure (!) 144/54, pulse 81, temperature 99.4 F (37.4 C), temperature source Oral, resp. rate 16, height '5\' 11"'  (1.803 m), weight 73 kg (161 lb), SpO2 99 %. Physical Exam  Constitutional: No distress.  Elderly, chronically ill, confused and in [place].  Non verbal while I was in the room.  Incontinent of urine and stool.    HENT:  Head: Normocephalic and atraumatic.  Mouth/Throat: No oropharyngeal exudate.  Eyes: Right eye exhibits no discharge. Left eye exhibits no discharge. No scleral icterus.  Pupils equal  Neck: Normal range of motion. Neck supple. No JVD present. No tracheal deviation present. No thyromegaly present.  Cardiovascular: Normal rate, regular rhythm, normal heart sounds and intact distal pulses.   No murmur  heard. Respiratory: Effort normal and breath sounds normal. No respiratory distress. He has no wheezes. He has no rales. He exhibits no tenderness.  GI: Soft. Bowel sounds are normal. He exhibits no distension and no mass. There is no tenderness. There is no rebound and no guarding.  Genitourinary:  Genitourinary Comments: Stage IV decubitus 5 cm in diameter with non viable necrotic tissue still attached to skin. Picture below It measures 5 cm diameter with 4 cm deep.    Musculoskeletal: He exhibits no edema or tenderness.  Lymphadenopathy:    He has no cervical adenopathy.  Neurological:  Pt is confused,  Nonverbal during exam, cleaning him up and dressing change.    Skin: Skin is warm and dry. No rash noted. He is not diaphoretic. No erythema. No pallor.  Sacral decubitus 5 cm diameter 4 cm deep.  Psychiatric:  Pt confused and non verbal    Pt is on his left side     Assessment/Plan: Fever with probable sepsis 5 cm  X 4 cm deep sacral decubitus Dementia and confused/non verbal this PM MR pelvis pending BPH Hx of asthma Malnutrition and deconditioning  Plan:  We will get a debridement tray to bedside and remove that dead skin you can see in the picture.  I would then start on Hydrotherapy.  He will need allot of nursing care to keep him off this, air mattress, and BID dressing changes.    He will need to greatly increase his nutrition and mobility to heal this.     JENNINGS,WILLARD 02/21/2017, 11:01 AM   Agree with above. Limited options for wound other that keep it clean.  Alphonsa Overall, MD, [facility] Surgery Pager: [phone] Office phone:  [phone]

## 2017-02-21 NOTE — Progress Notes (Signed)
Nursing Note: Paged on-call and made aware that lactic acid will nned to be drawn after blood goes in the patient due to get 1 more unit of blood and no labs can be drawn while blood is infusing.wbb

## 2017-02-21 NOTE — NC FL2 (Signed)
[place] MEDICAID FL2 LEVEL OF CARE SCREENING TOOL     IDENTIFICATION  Patient Name: Andre Ball Birthdate: 02-11-1941 Sex: male Admission Date (Current Location): 02/20/2017  [facility] and [place] Number:  Producer, television/film/video and Address:  [facility],  [address] [place] [address]      Provider Number: [id number]  Attending Physician Name and Address:  Catarina Hartshorn, MD  Relative Name and Phone Number:       Current Level of Care: Hospital Recommended Level of Care: Skilled Nursing Facility Prior Approval Number:    Date Approved/Denied:   PASRR Number: [id number]  Discharge Plan: SNF    Current Diagnoses: Patient Active Problem List   Diagnosis Date Noted  . Acute blood loss anemia 02/20/2017  . Sacral decubitus ulcer, stage IV (HCC) 02/20/2017  . Fecal occult blood test positive   . Symptomatic anemia   . Rhabdomyolysis 02/01/2017  . Malnutrition of moderate degree 02/01/2017  . Pressure injury of skin 02/01/2017  . Dehydration 01/31/2017  . Hypernatremia 01/31/2017  . General weakness 01/31/2017  . Essential hypertension 01/31/2017  . Dementia 01/31/2017  . Memory deficits 11/01/2014    Orientation RESPIRATION BLADDER Height & Weight     Place    Continent Weight: 161 lb (73 kg) Height:   (180.3 cm)  BEHAVIORAL SYMPTOMS/MOOD NEUROLOGICAL BOWEL NUTRITION STATUS      Continent Diet (clear liquid)  AMBULATORY STATUS COMMUNICATION OF NEEDS Skin   Extensive Assist Verbally Other (Comment)   Unstageable pressure injury; sacrum; 4cm x 4.5cm x 3.5cm;100% black/grey non viable tissue Stage 3 pressure injury: left heel: 2.5cm x 3.0cm x 0.2cm; 100% pink, clean, moist Stage 2 (2) left heel: distal-1cm x 2.0cm x 0.1cm and proximal-1cm x 3cm x 0.1cm; both 100% clean, pink, dry Pressure Injury POA: Yes x 4 Measurement:see above Wound bed: see above Drainage (amount, consistency, odor) purulent, foul odor, moderate drainage from the  sacrum.  The other sites minimal, serosanguinous  Periwound: intact  Dressing procedure/placement/frequency: Add low air loss mattress for pressure redistribution. Add Prevalon boots bilaterally for offloading the heels Add enzymatic debridement with hydrotherapy after surgical evaluation if not a surgery candidate Silicone foam to the left hip and left heel.                      Personal Care Assistance Level of Assistance  Bathing, Feeding, Dressing Bathing Assistance: Maximum assistance Feeding assistance: Limited assistance Dressing Assistance: Maximum assistance     Functional Limitations Info  Sight, Hearing, Speech Sight Info: Adequate Hearing Info: Adequate Speech Info: Adequate    SPECIAL CARE FACTORS FREQUENCY  PT (By licensed PT), OT (By licensed OT)     PT Frequency: 5x OT Frequency: 5x            Contractures Contractures Info: Not present    Additional Factors Info  Code Status, Allergies Code Status Info: DNR Allergies Info: nka           Current Medications (02/21/2017):  This is the current hospital active medication list Current Facility-Administered Medications  Medication Dose Route Frequency Provider Last Rate Last Dose  . 0.45 % NaCl with KCl 20 mEq / L infusion   Intravenous Continuous David Tat, MD      . 0.9 %  sodium chloride infusion   Intravenous Once Canary Brim Tegeler, MD      . acetaminophen (TYLENOL) tablet 650 mg  650 mg Oral Q6H PRN Catarina Hartshorn, MD  Or  . acetaminophen (TYLENOL) suppository 650 mg  650 mg Rectal Q6H PRN Catarina Hartshorn, MD      . ceFEPIme (MAXIPIME) 1 g in dextrose 5 % 50 mL IVPB  1 g Intravenous Q8H Terri L Green, RPH 100 mL/hr at 02/21/17 0528 1 g at 02/21/17 0528  . collagenase (SANTYL) ointment   Topical Daily Catarina Hartshorn, MD      . donepezil (ARICEPT) tablet 10 mg  10 mg Oral QHS Catarina Hartshorn, MD   10 mg at 02/21/17 0040  . feeding supplement (ENSURE ENLIVE) (ENSURE ENLIVE) liquid 237 mL  237 mL Oral BID BM  Catarina Hartshorn, MD      . finasteride (PROSCAR) tablet 5 mg  5 mg Oral Daily Catarina Hartshorn, MD   5 mg at 02/21/17 1158  . fluticasone (FLONASE) 50 MCG/ACT nasal spray 2 spray  2 spray Each Nare Daily Catarina Hartshorn, MD   2 spray at 02/21/17 1159  . memantine (NAMENDA) tablet 10 mg  10 mg Oral BID Catarina Hartshorn, MD   10 mg at 02/21/17 1159  . montelukast (SINGULAIR) tablet 10 mg  10 mg Oral QHS Catarina Hartshorn, MD   10 mg at 02/21/17 0040  . multivitamin with minerals tablet 1 tablet  1 tablet Oral Daily Catarina Hartshorn, MD   1 tablet at 02/21/17 1200  . nitroGLYCERIN (NITROSTAT) SL tablet 0.4 mg  0.4 mg Sublingual Q5 min PRN Catarina Hartshorn, MD      . omega-3 acid ethyl esters (LOVAZA) capsule 1 g  1 g Oral Daily Catarina Hartshorn, MD   1 g at 02/21/17 1200  . ondansetron (ZOFRAN) tablet 4 mg  4 mg Oral Q6H PRN Catarina Hartshorn, MD       Or  . ondansetron (ZOFRAN) injection 4 mg  4 mg Intravenous Q6H PRN Catarina Hartshorn, MD      . oxyCODONE-acetaminophen (PERCOCET/ROXICET) 5-325 MG per tablet 1 tablet  1 tablet Oral Q6H PRN Catarina Hartshorn, MD      . pantoprazole (PROTONIX) EC tablet 40 mg  40 mg Oral BID AC Bernette Redbird, MD   40 mg at 02/21/17 1158  . risperiDONE (RISPERDAL) tablet 1 mg  1 mg Oral BID Catarina Hartshorn, MD   1 mg at 02/21/17 1158  . sertraline (ZOLOFT) tablet 25 mg  25 mg Oral Daily Catarina Hartshorn, MD   25 mg at 02/21/17 1158  . tamsulosin (FLOMAX) capsule 0.4 mg  0.4 mg Oral Daily Catarina Hartshorn, MD   0.4 mg at 02/21/17 1158  . vancomycin (VANCOCIN) IVPB 1000 mg/200 mL premix  1,000 mg Intravenous Q12H Otho Bellows, RPH 200 mL/hr at 02/21/17 1239 1,000 mg at 02/21/17 1239  . vitamin B-12 (CYANOCOBALAMIN) tablet 500 mcg  500 mcg Oral Daily Catarina Hartshorn, MD   1,000 mcg at 02/21/17 1157     Discharge Medications: Please see discharge summary for a list of discharge medications.  Relevant Imaging Results:  Relevant Lab Results:   Additional Information ss # [id number]  Nelwyn Salisbury, LCSW

## 2017-02-21 NOTE — Progress Notes (Signed)
PT Cancellation Note  Patient Details Name: SAYER MASINI MRN: [id number] DOB: 29-Oct-1941   Cancelled Treatment:    Reason Eval/Treat Not Completed: Hydro evaluation order received. Per WOC Nurse, PT hydrotherapy is to begin after surgical consult, if surgical intervention is not necessary. Will hold PT hydro eval for now.    Rebeca Alert, MPT Pager: [phone]

## 2017-02-21 NOTE — Progress Notes (Signed)
PROGRESS NOTE  BRUK TUMOLO VHQ:[id number] DOB: 1941/09/11 DOA: 02/20/2017 PCP: Maggie Font, MD  Brief History:   76 y.o. male with medical history of dementia, BPH, hypertension, hyperlipidemia presenting from the nursing home secondary to fatigue. Unfortunately, the patient has a history of dementia, and he is unable to provide any history. All of this history is obtained from review of the medical record in speaking with the patient's wife. The patient was recently discharged from the hospital after a stay from 01/31/2017 through 02/04/2017 during which time the patient was treated for rhabdomyolysis secondary to frequent falls as well as his sacral and foot decubitus. The patient was discharged in stable condition to skilled nursing facility. His hemoglobin was 11.6 at the time of discharge. The patient was noted to have increasing fatigue and lethargy. Routine blood work was done and showed a hemoglobin in the 5 range. As a result, the patient was brought to the emergency department for further evaluation. There's been no recent hematemesis, hematuria, hematochezia, melena per his wife. The patient has not been started on any NSAIDs recently. There's been no fevers, chills, chest pain, abdominal pain.  Assessment/Plan: Acute Blood Loss Anemia/Heme Positive Stool/Symptomatic Anemia -Concerned about GI bleed -question NSAID induced--pt on DayPro as noted on MAR -appreciate Dr. Buccini-->PPI bid, defer endoscopy unless there is recurrent hemorrhage/refractory anemia -start PPI bid x 1 month, then q day -advance to full liquid diet -Baseline hemoglobin approximately 11 -Transfused 3 units PRBC -Hold aspirin  Infected sacral decubitus ulcer--stage 4 -This ulcer was present prior to his last hospitalization -bone palpable on exam with necrotic tissue--likely underlying osteomyelitis -Continue empiric vancomycin and cefepime -MRI pelvis rule out abscess/osteomyelitis   -ESR--60 -consult general surgery for possible debridement -appreciate wound care nurse  -spouse seems "shocked"; under impression this happened "so quickly"  Acute metabolic encephalopathy -Secondary to blood loss anemia and infectious process -Spouse appears to be surprised this happened "so quickly" -remains encephalopathic  Right heel decubitus -Not infected on exam -Appreciate wound care nurse  Hypertension -Holding lisinopril -BP acceptable  Hypernatremia -Continue 1/2NS  Dementia -continue aricept and namenda  Hyperglycemia -A1C--pending -likely stress induced hyperglycemia  BPH -continue tamulosin and finasteride  Goals of Care -pt is DNR -consult palliative medicine    Disposition Plan:   Home in 3-4 days  Family Communication:   Spouse updated at bedside 02/21/17--Total time spent 35 minutes.  Greater than 50% spent face to face counseling and coordinating care.   Consultants:  [facility]; general surgery  Code Status:  FULL   DVT Prophylaxis:  SCDs   Procedures: As Listed in Progress Note Above  Antibiotics: Vancomycin 4/25>>> Cefepime 4/25>>>    Subjective: Patient opens eyes to voice. He intermittently answers questions. Denies any chest pain, short of breath, pain. No vomiting, diarrhea, respiratory distress, uncontrolled pain.  Objective: Vitals:   02/21/17 0325 02/21/17 0510 02/21/17 1018 02/21/17 1339  BP: 132/60 (!) 123/59 (!) 144/54 (!) 127/56  Pulse: 70 66 81 70  Resp: '20 20 16 16  ' Temp: 99 F (37.2 C) 97.7 F (36.5 C) 99.4 F (37.4 C) 98.5 F (36.9 C)  TempSrc: Oral Oral Oral Oral  SpO2: 100% 100% 99% 100%  Weight:      Height:        Intake/Output Summary (Last 24 hours) at 02/21/17 1553 Last data filed at 02/21/17 1537  Gross per 24 hour  Intake  2113 ml  Output              300 ml  Net             1813 ml   Weight change:  Exam:   General:  Pt is alert, does not follow commands  appropriately, not in acute distress  HEENT: No icterus, No thrush, No neck mass, [place]/AT  Cardiovascular: RRR, S1/S2, no rubs, no gallops  Respiratory: CTA bilaterally, no wheezing, no crackles, no rhonchi  Abdomen: Soft/+BS, non tender, non distended, no guarding  Extremities: No edema, No lymphangitis, No petechiae, No rashes, no synovitis   Data Reviewed: I have personally reviewed following labs and imaging studies Basic Metabolic Panel:  Recent Labs Lab 02/20/17 1535 02/21/17 0834  NA 145 144  K 3.7 3.5  CL 110 110  CO2 24 24  GLUCOSE 154* 95  BUN 27* 23*  CREATININE 1.07 0.91  CALCIUM 8.5* 8.0*   Liver Function Tests:  Recent Labs Lab 02/20/17 1535  AST 53*  ALT 33  ALKPHOS 62  BILITOT 0.2*  PROT 6.7  ALBUMIN 2.7*   No results for input(s): LIPASE, AMYLASE in the last 168 hours. No results for input(s): AMMONIA in the last 168 hours. Coagulation Profile:  Recent Labs Lab 02/20/17 1535  INR 1.11   CBC:  Recent Labs Lab 02/20/17 1535 02/21/17 0834  WBC 10.9* 10.3  NEUTROABS 9.7*  --   HGB 5.8* 8.5*  HCT 17.2* 25.1*  MCV 95.6 91.6  PLT 324 264   Cardiac Enzymes: No results for input(s): CKTOTAL, CKMB, CKMBINDEX, TROPONINI in the last 168 hours. BNP: Invalid input(s): POCBNP CBG: No results for input(s): GLUCAP in the last 168 hours. HbA1C: No results for input(s): HGBA1C in the last 72 hours. Urine analysis:    Component Value Date/Time   COLORURINE YELLOW 02/21/2017 0125   APPEARANCEUR HAZY (A) 02/21/2017 0125   LABSPEC 1.024 02/21/2017 0125   PHURINE 5.0 02/21/2017 0125   GLUCOSEU NEGATIVE 02/21/2017 0125   HGBUR NEGATIVE 02/21/2017 0125   BILIRUBINUR NEGATIVE 02/21/2017 0125   KETONESUR NEGATIVE 02/21/2017 0125   PROTEINUR NEGATIVE 02/21/2017 0125   NITRITE NEGATIVE 02/21/2017 0125   LEUKOCYTESUR NEGATIVE 02/21/2017 0125   Sepsis Labs: '@LABRCNTIP' (procalcitonin:4,lacticidven:4) ) Recent Results (from the past 240 hour(s))   MRSA PCR Screening     Status: None   Collection Time: 02/21/17  6:32 AM  Result Value Ref Range Status   MRSA by PCR NEGATIVE NEGATIVE Final    Comment:        The GeneXpert MRSA Assay (FDA approved for NASAL specimens only), is one component of a comprehensive MRSA colonization surveillance program. It is not intended to diagnose MRSA infection nor to guide or monitor treatment for MRSA infections.      Scheduled Meds: . collagenase   Topical Daily  . donepezil  10 mg Oral QHS  . feeding supplement (ENSURE ENLIVE)  237 mL Oral BID BM  . finasteride  5 mg Oral Daily  . fluticasone  2 spray Each Nare Daily  . memantine  10 mg Oral BID  . montelukast  10 mg Oral QHS  . multivitamin with minerals  1 tablet Oral Daily  . omega-3 acid ethyl esters  1 g Oral Daily  . pantoprazole  40 mg Oral BID AC  . risperiDONE  1 mg Oral BID  . sertraline  25 mg Oral Daily  . tamsulosin  0.4 mg Oral Daily  . vitamin B-12  500 mcg Oral Daily   Continuous Infusions: . 0.45 % NaCl with KCl 20 mEq / L 75 mL/hr at 02/21/17 1455  . sodium chloride    . ceFEPime (MAXIPIME) IV Stopped (02/21/17 1525)  . vancomycin Stopped (02/21/17 1455)    Procedures/Studies: Dg Chest 2 View  Result Date: 02/20/2017 CLINICAL DATA:  Fatigue.  Dementia. EXAM: CHEST  2 VIEW COMPARISON:  One-view chest x-ray 01/31/2017. Two-view chest x-ray 01/16/2017. FINDINGS: The heart is mildly enlarged. Lung volumes are low. There is no edema. There is some blunting of the CP angles posteriorly on both sides. Degenerative changes are again seen in the thoracic spine. IMPRESSION: 1. Blunting of posterior CP angle bilaterally appears chronic. No definite effusions. 2. Otherwise negative two view chest x-ray. Electronically Signed   By: San Morelle M.D.   On: 02/20/2017 15:24   Dg Pelvis 1-2 Views  Result Date: 01/31/2017 CLINICAL DATA:  Fall with buttock pain EXAM: PELVIS - 1-2 VIEW COMPARISON:  None. FINDINGS: SI  joints are symmetric bilaterally. The pubic symphysis appears intact. Both femoral heads appear normally positioned. IMPRESSION: No definite acute osseous abnormality Electronically Signed   By: Donavan Foil M.D.   On: 01/31/2017 23:42   Ct Head Wo Contrast  Result Date: 01/31/2017 CLINICAL DATA:  [place] last night.  Ecchymosis. EXAM: CT HEAD WITHOUT CONTRAST CT CERVICAL SPINE WITHOUT CONTRAST TECHNIQUE: Multidetector CT imaging of the head and cervical spine was performed following the standard protocol without intravenous contrast. Multiplanar CT image reconstructions of the cervical spine were also generated. COMPARISON:  01/16/2017 FINDINGS: CT HEAD FINDINGS Brain: There is no intracranial hemorrhage, mass or evidence of acute infarction. There is moderate generalized atrophy. There is moderate chronic microvascular ischemic change. There is no significant extra-axial fluid collection. No acute intracranial findings are evident. Vascular: No hyperdense vessel or unexpected calcification. Skull: Normal. Negative for fracture or focal lesion. Sinuses/Orbits: No acute finding. Other: None. CT CERVICAL SPINE FINDINGS Alignment: Normal. Skull base and vertebrae: No acute fracture. No primary bone lesion or focal pathologic process. Soft tissues and spinal canal: No prevertebral fluid or swelling. No visible canal hematoma. Disc levels: Moderate cervical degenerative disc changes at C5-6. Facet articulations are arthritic but intact. Upper chest: Negative. Other: None IMPRESSION: 1. No acute intracranial findings. There is moderate generalized atrophy and chronic appearing white matter hypodensities which likely represent small vessel ischemic disease. 2. Negative for acute cervical spine fracture. Electronically Signed   By: Andreas Newport M.D.   On: 01/31/2017 23:44   Ct Cervical Spine Wo Contrast  Result Date: 01/31/2017 CLINICAL DATA:  [place] last night.  Ecchymosis. EXAM: CT HEAD WITHOUT CONTRAST CT  CERVICAL SPINE WITHOUT CONTRAST TECHNIQUE: Multidetector CT imaging of the head and cervical spine was performed following the standard protocol without intravenous contrast. Multiplanar CT image reconstructions of the cervical spine were also generated. COMPARISON:  01/16/2017 FINDINGS: CT HEAD FINDINGS Brain: There is no intracranial hemorrhage, mass or evidence of acute infarction. There is moderate generalized atrophy. There is moderate chronic microvascular ischemic change. There is no significant extra-axial fluid collection. No acute intracranial findings are evident. Vascular: No hyperdense vessel or unexpected calcification. Skull: Normal. Negative for fracture or focal lesion. Sinuses/Orbits: No acute finding. Other: None. CT CERVICAL SPINE FINDINGS Alignment: Normal. Skull base and vertebrae: No acute fracture. No primary bone lesion or focal pathologic process. Soft tissues and spinal canal: No prevertebral fluid or swelling. No visible canal hematoma. Disc levels: Moderate cervical degenerative disc changes at C5-6.  Facet articulations are arthritic but intact. Upper chest: Negative. Other: None IMPRESSION: 1. No acute intracranial findings. There is moderate generalized atrophy and chronic appearing white matter hypodensities which likely represent small vessel ischemic disease. 2. Negative for acute cervical spine fracture. Electronically Signed   By: Andreas Newport M.D.   On: 01/31/2017 23:44   US Abdomen Complete  Result Date: 02/01/2017 CLINICAL DATA:  Abnormal LFTs EXAM: ABDOMEN ULTRASOUND COMPLETE COMPARISON:  Ultrasound 01/08/2005 FINDINGS: Gallbladder: No gallstones or wall thickening visualized. No sonographic Murphy sign noted by sonographer. Common bile duct: Diameter: 4 mm in diameter within normal limits Liver: No focal lesion identified. Within normal limits in parenchymal echogenicity. IVC: No abnormality visualized. Pancreas: Visualized portion unremarkable. Spleen: Size and  appearance within normal limits. Measures 4 cm length Right Kidney: Length: 10.4 cm. Echogenicity within normal limits. No mass or hydronephrosis visualized. Left Kidney: Length: 10.4 cm. Echogenicity within normal limits. No hydronephrosis. There is a left renal cyst measures 9 x 9 mm. Abdominal aorta: No aneurysm visualized. Measures up to 2.2 cm in diameter Other findings: None. IMPRESSION: 1. No gallstones are noted within gallbladder.  Normal CBD. 2. No focal hepatic mass.  Normal liver echogenicity. 3. No hydronephrosis. No renal calculi. Left renal cyst measures 9 mm. 4. No aortic aneurysm. Electronically Signed   By: Lahoma Crocker M.D.   On: 02/01/2017 10:40   Dg Chest Port 1 View  Result Date: 01/31/2017 CLINICAL DATA:  Possible fall last evening.  Altered mental status. EXAM: PORTABLE CHEST 1 VIEW COMPARISON:  01/16/2017 CXR FINDINGS: The heart size and mediastinal contours are within normal limits. Atherosclerosis at the aortic arch without aneurysm. Both lungs are clear. The visualized skeletal structures are unremarkable. IMPRESSION: No active disease. Electronically Signed   By: Ashley Royalty M.D.   On: 01/31/2017 20:41    Nicci Vaughan, DO  [facility] Pager [phone]  If 7PM-7AM, please contact night-coverage [email or web address] Password [email or web address] 02/21/2017, 3:53 PM   LOS: 1 day

## 2017-02-22 DIAGNOSIS — Z515 Encounter for palliative care: Secondary | ICD-10-CM

## 2017-02-22 DIAGNOSIS — F0390 Unspecified dementia without behavioral disturbance: Secondary | ICD-10-CM

## 2017-02-22 DIAGNOSIS — M4628 Osteomyelitis of vertebra, sacral and sacrococcygeal region: Secondary | ICD-10-CM

## 2017-02-22 DIAGNOSIS — L89109 Pressure ulcer of unspecified part of back, unspecified stage: Secondary | ICD-10-CM

## 2017-02-22 DIAGNOSIS — Z7189 Other specified counseling: Secondary | ICD-10-CM

## 2017-02-22 DIAGNOSIS — L8994 Pressure ulcer of unspecified site, stage 4: Secondary | ICD-10-CM

## 2017-02-22 LAB — HEMOGLOBIN A1C
Hgb A1c MFr Bld: 5.2 % (ref 4.8–5.6)
Mean Plasma Glucose: 103 mg/dL

## 2017-02-22 LAB — BASIC METABOLIC PANEL
ANION GAP: 8 (ref 5–15)
BUN: 19 mg/dL (ref 6–20)
CHLORIDE: 110 mmol/L (ref 101–111)
CO2: 24 mmol/L (ref 22–32)
Calcium: 7.6 mg/dL — ABNORMAL LOW (ref 8.9–10.3)
Creatinine, Ser: 0.91 mg/dL (ref 0.61–1.24)
GFR calc Af Amer: >60 mL/min (ref >60)
GFR calc non Af Amer: >60 mL/min (ref >60)
Glucose, Bld: 98 mg/dL (ref 65–99)
POTASSIUM: 3.8 mmol/L (ref 3.5–5.1)
Sodium: 142 mmol/L (ref 135–145)

## 2017-02-22 LAB — CBC
HEMATOCRIT: 25.3 % — AB (ref 39.0–52.0)
HEMOGLOBIN: 8.6 g/dL — AB (ref 13.0–17.0)
MCH: 31.3 pg (ref 26.0–34.0)
MCHC: 34 g/dL (ref 30.0–36.0)
MCV: 92 fL (ref 78.0–100.0)
Platelets: 251 10*3/uL (ref 150–400)
RBC: 2.75 MIL/uL — ABNORMAL LOW (ref 4.22–5.81)
RDW: 15 % (ref 11.5–15.5)
WBC: 14.3 10*3/uL — ABNORMAL HIGH (ref 4.0–10.5)

## 2017-02-22 LAB — HIGH SENSITIVITY CRP: CRP, High Sensitivity: 148.83 mg/L — ABNORMAL HIGH (ref 0.00–3.00)

## 2017-02-22 LAB — C-REACTIVE PROTEIN: CRP: 19.3 mg/dL — AB (ref <1.0)

## 2017-02-22 MED ORDER — UNJURY CHICKEN SOUP POWDER
2.0000 [oz_av] | Freq: Four times a day (QID) | ORAL | Status: DC
  Administered 2017-02-22 – 2017-02-26 (×14): 2 [oz_av] via ORAL
  Filled 2017-02-22 (×24): qty 27

## 2017-02-22 MED ORDER — DEXTROSE 5 % IV SOLN
2.0000 g | INTRAVENOUS | Status: DC
  Administered 2017-02-22: 19:00:00 via INTRAVENOUS
  Administered 2017-02-23 – 2017-02-26 (×4): 2 g via INTRAVENOUS
  Filled 2017-02-22 (×6): qty 2

## 2017-02-22 MED ORDER — PANTOPRAZOLE SODIUM 40 MG PO PACK
40.0000 mg | PACK | Freq: Two times a day (BID) | ORAL | Status: DC
  Administered 2017-02-22 – 2017-02-26 (×7): 40 mg via ORAL
  Filled 2017-02-22 (×10): qty 20

## 2017-02-22 MED ORDER — METRONIDAZOLE IN NACL 5-0.79 MG/ML-% IV SOLN
500.0000 mg | Freq: Three times a day (TID) | INTRAVENOUS | Status: DC
  Administered 2017-02-22 – 2017-02-27 (×15): 500 mg via INTRAVENOUS
  Filled 2017-02-22 (×15): qty 100

## 2017-02-22 NOTE — Progress Notes (Signed)
Pharmacy Antibiotic Follow-up Note  Andre Ball is a 76 y.o. year-old male admitted on 02/20/2017.  The patient is currently on day 1 of Vancomycin & Cefepime for osteomyelitis.  Today, 02/22/2017  Day #2 antibiotics  Renal: SCr WNL  WBC elevated  Tm/24h 101.5  4/26 MRI: A sacral decubitus ulcer is noted with marrow signal abnormality involving the tip of the coccyx raising concern for osteomyelitis of the distal coccygeal segment.  4/26 Bedside debridement  CRP elevated at 19.3, Sed rate inc at 60  Assessment/Plan: Vancomycin  x1, then 1gm IV every 12 hours.  Goal trough 15-20 mcg/mL.   Continue vancomycin 1gm IV q12h (goal trough 15-20 mcg/mL for osteomyelitis).   Monitor renal function  Check trough in am  Continue Cefepime 1gm q8h  Temp (24hrs), Avg:99.8 F (37.7 C), Min:98.5 F (36.9 C), Max:101.5 F (38.6 C)   Recent Labs Lab 02/20/17 1535 02/21/17 0834 02/22/17 0451  WBC 10.9* 10.3 14.3*     Recent Labs Lab 02/20/17 1535 02/21/17 0834 02/22/17 0451  CREATININE 1.07 0.91 0.91   Estimated Creatinine Clearance: 73.5 mL/min (by C-G formula based on SCr of 0.91 mg/dL).    No Known Allergies  Antimicrobials this admission: 4/25 Cefepime >>  4/25 Vancomycin >>   Levels/dose changes this admission: 4/28 0930 VT = __ mcg/mL on 1gm q12h (prior to 6th overall dose) Microbiology results: 4/26 MRSA PCR: neg No cultures  Thank you for allowing pharmacy to be a part of this patient's care.  Juliette Alcide, PharmD, BCPS.   Pager: [phone] 02/22/2017 8:57 AM

## 2017-02-22 NOTE — Progress Notes (Signed)
Initial Nutrition Assessment  DOCUMENTATION CODES:   Non-severe (moderate) malnutrition in context of chronic illness  INTERVENTION:   Ensure Enlive po BID, each supplement provides 350 kcal and 20 grams of protein  Unjury chicken Soup four times daily, Each serving provides 100kcal and 21g protein   MVI  RD will order [employer] when diet advanced  Recommend nutrition support if pt unable to meet estimated needs via oral intake as protein is essential for wound healing.   NUTRITION DIAGNOSIS:   Malnutrition (moderate) related to chronic illness, other (see comment) (dementia) as evidenced by moderate depletions of muscle mass, mild depletion of body fat.  GOAL:   Patient will meet greater than or equal to 90% of their needs  MONITOR:   PO intake, Supplement acceptance, Labs, Weight trends, Skin  REASON FOR ASSESSMENT:   Consult Wound healing  ASSESSMENT:    76 y.o. male with medical history of dementia, BPH, hypertension, hyperlipidemia presenting from the nursing home secondary to fatigue. Unfortunately, the patient has a history of dementia, and he is unable to provide any history. Admitted for infected pressure injury with possible sepsis and possible GI bleed    Pt sleeping at time of RD visit. Per nurse tech, pt ate well this morning and drank 100% of his broth. Pt currently on liquid diet. RD will order Unjury. Pt with multiple severe wounds s/p bedside debridement today. MR showed concern for osteomyelitis. Pt wt stable since last admit. GI signed off today; pt's hemoglobin stable and BUN improved per MD note. GI bleed presumed to be related to aspirin gastropathy. Adequate protein intake will be essential for wound healing. If pt unable to meet estimated needs via meals and supplements, nutrition support is recommended. Palliative care consulted and plan to meet with family today to discuss goals of care.   Medications reviewed and include: MVI, omega-3, protonix,  Vit B-12, NaCl with KCl, cefepime, vancomycin  Labs reviewed: Ca 7.6(L), CRP 19.3(H) WBC- 14.3(H), Hgb 8.6(L), Hct 25.3(L)  Nutrition-Focused physical exam completed. Findings are mild to moderate fat depletion in chest and upper arms, moderate muscle depletion in clavicles, shoulders, and temporal regions, and moderate edema in BLE.   Diet Order:  Diet clear liquid Room service appropriate? Yes; Fluid consistency: Thin  Skin:   Stage II hip x 2, Stage IV sacrum, Stage III heel  Last BM:  4/26  Height:   Ht Readings from Last 1 Encounters:  02/20/17  (1.803 m)    Weight:   Wt Readings from Last 1 Encounters:  02/20/17 161 lb (73 kg)    Ideal Body Weight:  78.1 kg  BMI:  Body mass index is 22.45 kg/m.  Estimated Nutritional Needs:   Kcal:  1850-2150kcal/day   Protein:  102-117g/day   Fluid:  >1.8L/day   EDUCATION NEEDS:   No education needs identified at this time  Betsey Holiday, RD, LDN Pager #[phone]

## 2017-02-22 NOTE — Progress Notes (Signed)
HYDROTHERAPY EVALUATION      02/22/17 1500  Subjective Assessment  Subjective pt is grossly nonverbal  Patient and Family Stated Goals pt unable-no family present  Prior Treatments unknown  Evaluation and Treatment  Evaluation and Treatment Procedures Explained to Patient/Family Yes  Evaluation and Treatment Procedures Patient unable to consent due to mental status  Pressure Injury 02/22/17 Unstageable - Full thickness tissue loss in which the base of the ulcer is covered by slough (yellow, tan, gray, green or brown) and/or eschar (tan, brown or black) in the wound bed.  Date First Assessed/Time First Assessed: 02/22/17 1345   Location: Sacrum  Staging: Unstageable - Full thickness tissue loss in which the base of the ulcer is covered by slough (yellow, tan, gray, green or brown) and/or eschar (tan, brown or black) in...  Dressing Type Moist to moist;ABD (Santyl)  Dressing Change Frequency Daily  % Wound base Black/Eschar 100%  Peri-wound Assessment Maceration;Excoriated;Bleeding;Pink  Wound Length (cm) 10 cm  Wound Width (cm) 6.5 cm  Wound Depth (cm) 3.5 cm  Undermining (cm) 12:00  3cm  Drainage Amount Moderate  Drainage Description Odor;Serosanguineous  Treatment Debridement (Selective);Hydrotherapy (Pulse lavage);Packing (Saline gauze) (Enzymatic debridement)  Hydrotherapy  Pulsed lavage therapy - wound location sacrum  Pulsed Lavage with Suction (psi) 8 psi  Pulsed Lavage with Suction - Normal Saline Used 1000 mL  Pulsed Lavage Tip Tip with splash shield  Wound Therapy - Assess/Plan/Recommendations  Wound Therapy - Clinical Statement 76 yo male admitted with anemia. Hx of dementia. Mostly bedbound. Bedside debridement by surgery 02/22/17  Wound Therapy - Functional Problem List bedbound  Factors Delaying/Impairing Wound Healing Infection - systemic/local;Incontinence;Immobility  Hydrotherapy Plan Debridement;Pulsatile lavage with suction;Patient/family education;Dressing  change  Wound Therapy - Frequency 6X / week  Wound Therapy - Current Recommendations Case manager/social work  Wound Therapy - Follow Up Recommendations Skilled nursing facility  Wound Plan Hydrotherapy and enzymatic debridement to facilitate wound healing.   Wound Therapy Goals - Improve the function of patient's integumentary system by progressing the wound(s) through the phases of wound healing by:  Decrease Necrotic Tissue to 50  Increase Granulation Tissue to 50  Improve Drainage Characteristics Min  Goals/treatment plan/discharge plan were made with and agreed upon by patient/family No, Patient unable to participate in goals/treatment/discharge plan and family unavailable  Time For Goal Achievement 2 weeks  Wound Therapy - Potential for Goals Bolivar Haw, MPT [phone]

## 2017-02-22 NOTE — Progress Notes (Signed)
PROGRESS NOTE  Andre Ball EMV:[id number] DOB: 17-Jul-1941 DOA: 02/20/2017 PCP: Maggie Font, MD  Brief History:  76 y.o.malewith medical history of dementia, BPH, hypertension, hyperlipidemia presenting from the nursing home secondary to fatigue. Unfortunately, the patient has a history of dementia, and he is unable to provide any history. All of this history is obtained from review of the medical record in speaking with the patient's wife. The patient was recently discharged from the hospital after a stay from 01/31/2017 through 02/04/2017 during which time the patient was treated for rhabdomyolysis secondary to frequent falls as well as his sacral and foot decubitus. The patient was discharged in stable condition to skilled nursing facility. His hemoglobin was 11.6 at the time of discharge. The patient was noted to have increasing fatigue and lethargy. Routine blood work was done and showed a hemoglobin in the 5 range. As a result, the patient was brought to the emergency department for further evaluation. There's been no recent hematemesis, hematuria, hematochezia, melena per his wife. The patient has not been started on any NSAIDs recently. There's been no fevers, chills, chest pain, abdominal pain.  Assessment/Plan: Acute Blood Loss Anemia/Heme Positive Stool/Symptomatic Anemia -Concerned about GI bleed -question NSAID induced--pt on DayPro as noted on MAR -appreciate Dr. 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 -This ulcer was present prior to his last hospitalization -bone palpable on exam with necrotic tissue--likely underlying osteomyelitis -Continue empiric vancomycin    -change cefepime to ceftriaxone -start metronidazole -02/21/17 MRI pelvis--tip of coccyx with abnormal marrow signal suggesting osteomyelitis, small left hip effusion  -ESR--60, CRP 19.3 -appreciate general surgery--bedside debridement on 4/27 -spouse seems "shocked"; under impression this happened "so quickly" -still having fevers with Tmax 101.5 in past 24 hours -blood cultures x 2 sets  Acute metabolic encephalopathy -Secondary to blood loss anemia and infectious process -Spouse appears to be surprised this happened "so quickly" -remains encephalopathic, but more interactive  Right heel decubitus -Not infected on exam -Appreciate wound care nurse  Hypertension -Holding lisinopril -BP acceptable  Hypernatremia -improving with IVF -Continue 1/2NS  Dementia -continue aricept and namenda  Hyperglycemia -A1C--5.2 -likely stress induced hyperglycemia  BPH -continue tamulosin and finasteride  Goals of Care -pt is DNR -appreciate palliative medicine -son appears to have better understanding of severity of clinical situation and potential poor outcomes    Disposition Plan:   SNF in 3-4 days  Family Communication:   Spouse and son updated at bedside 02/22/17--Total time spent 35 minutes.  Greater than 50% spent face to face counseling and coordinating care.   Consultants:  [facility]; general surgery; palliative medicine  Code Status:  FULL   DVT Prophylaxis:  SCDs   Procedures: As Listed in Progress Note Above  Antibiotics: Vancomycin 4/25>>> Cefepime 4/25>>>4/27 Ceftriaxone 4/27>> Metronidazole 4/27>>>    Subjective: Patient is more interactive today. He remains pleasantly confused. He denies any fevers, chills, chest pain, shortness breath, abdominal pain. He denies any significant pain in his buttock.  No vomiting or diarrhea  Objective: Vitals:   02/21/17 2135 02/22/17 0550 02/22/17 0642 02/22/17 1511  BP: (!) 113/53 (!) 123/58  (!)  112/53  Pulse: 67 68  66  Resp: '16 16  18  ' Temp:  100.2 F (37.9 C) (!) 101.5 F (38.6 C) 99.5 F (37.5 C) 98.8 F (37.1 C)  TempSrc: Oral Axillary Oral Oral  SpO2: 98% 98%    Weight:      Height:        Intake/Output Summary (Last 24 hours) at 02/22/17 1735 Last data filed at 02/22/17 0950  Gross per 24 hour  Intake          2253.75 ml  Output              150 ml  Net          2103.75 ml   Weight change:  Exam:   General:  Pt is alert, follows commands appropriately, not in acute distress  HEENT: No icterus, No thrush, No neck mass, [place]/AT  Cardiovascular: RRR, S1/S2, no rubs, no gallops  Respiratory: Poor inspiratory effort but clear to auscultation. No wheezing.  Abdomen: Soft/+BS, non tender, non distended, no guarding  Extremities: No edema, No lymphangitis, No petechiae, No rashes, no synovitis; sacral wound with malodorous odor. There is devitalized tissue. Bone is palpable. No purulent drainage. No crepitance or necrosis.   Data Reviewed: I have personally reviewed following labs and imaging studies Basic Metabolic Panel:  Recent Labs Lab 02/20/17 1535 02/21/17 0834 02/22/17 0451  NA 145 144 142  K 3.7 3.5 3.8  CL 110 110 110  CO2 '24 24 24  ' GLUCOSE 154* 95 98  BUN 27* 23* 19  CREATININE 1.07 0.91 0.91  CALCIUM 8.5* 8.0* 7.6*   Liver Function Tests:  Recent Labs Lab 02/20/17 1535  AST 53*  ALT 33  ALKPHOS 62  BILITOT 0.2*  PROT 6.7  ALBUMIN 2.7*   No results for input(s): LIPASE, AMYLASE in the last 168 hours. No results for input(s): AMMONIA in the last 168 hours. Coagulation Profile:  Recent Labs Lab 02/20/17 1535  INR 1.11   CBC:  Recent Labs Lab 02/20/17 1535 02/21/17 0834 02/22/17 0451  WBC 10.9* 10.3 14.3*  NEUTROABS 9.7*  --   --   HGB 5.8* 8.5* 8.6*  HCT 17.2* 25.1* 25.3*  MCV 95.6 91.6 92.0  PLT 324 264 251   Cardiac Enzymes: No results for input(s): CKTOTAL, CKMB, CKMBINDEX, TROPONINI in the last 168  hours. BNP: Invalid input(s): POCBNP CBG: No results for input(s): GLUCAP in the last 168 hours. HbA1C:  Recent Labs  02/21/17 0834  HGBA1C 5.2   Urine analysis:    Component Value Date/Time   COLORURINE YELLOW 02/21/2017 0125   APPEARANCEUR HAZY (A) 02/21/2017 0125   LABSPEC 1.024 02/21/2017 0125   PHURINE 5.0 02/21/2017 0125   GLUCOSEU NEGATIVE 02/21/2017 0125   HGBUR NEGATIVE 02/21/2017 0125   BILIRUBINUR NEGATIVE 02/21/2017 0125   KETONESUR NEGATIVE 02/21/2017 0125   PROTEINUR NEGATIVE 02/21/2017 0125   NITRITE NEGATIVE 02/21/2017 0125   LEUKOCYTESUR NEGATIVE 02/21/2017 0125   Sepsis Labs: '@LABRCNTIP' (procalcitonin:4,lacticidven:4) ) Recent Results (from the past 240 hour(s))  MRSA PCR Screening     Status: None   Collection Time: 02/21/17  6:32 AM  Result Value Ref Range Status   MRSA by PCR NEGATIVE NEGATIVE Final    Comment:        The GeneXpert MRSA Assay (FDA approved for NASAL specimens only), is one component of a comprehensive MRSA colonization surveillance program. It is not intended to diagnose MRSA infection nor to guide or monitor treatment for MRSA infections.      Scheduled Meds: . collagenase   Topical Daily  . donepezil  10 mg Oral QHS  . feeding supplement (ENSURE ENLIVE)  237 mL Oral BID BM  . finasteride  5 mg Oral Daily  . fluticasone  2 spray Each Nare Daily  . memantine  10 mg Oral BID  . montelukast  10 mg Oral QHS  . multivitamin with minerals  1 tablet Oral Daily  . omega-3 acid ethyl esters  1 g Oral Daily  . pantoprazole sodium  40 mg Oral BID  . protein supplement  2 oz Oral QID  . risperiDONE  1 mg Oral BID  . sertraline  25 mg Oral Daily  . tamsulosin  0.4 mg Oral Daily  . vitamin B-12  500 mcg Oral Daily   Continuous Infusions: . 0.45 % NaCl with KCl 20 mEq / L 75 mL/hr at 02/22/17 0933  . sodium chloride    . ceFEPime (MAXIPIME) IV 1 g (02/22/17 1226)  . vancomycin 1,000 mg (02/22/17 0934)     Procedures/Studies: Dg Chest 2 View  Result Date: 02/20/2017 CLINICAL DATA:  Fatigue.  Dementia. EXAM: CHEST  2 VIEW COMPARISON:  One-view chest x-ray 01/31/2017. Two-view chest x-ray 01/16/2017. FINDINGS: The heart is mildly enlarged. Lung volumes are low. There is no edema. There is some blunting of the CP angles posteriorly on both sides. Degenerative changes are again seen in the thoracic spine. IMPRESSION: 1. Blunting of posterior CP angle bilaterally appears chronic. No definite effusions. 2. Otherwise negative two view chest x-ray. Electronically Signed   By: San Morelle M.D.   On: 02/20/2017 15:24   Dg Pelvis 1-2 Views  Result Date: 01/31/2017 CLINICAL DATA:  Fall with buttock pain EXAM: PELVIS - 1-2 VIEW COMPARISON:  None. FINDINGS: SI joints are symmetric bilaterally. The pubic symphysis appears intact. Both femoral heads appear normally positioned. IMPRESSION: No definite acute osseous abnormality Electronically Signed   By: Donavan Foil M.D.   On: 01/31/2017 23:42   Ct Head Wo Contrast  Result Date: 01/31/2017 CLINICAL DATA:  [place] last night.  Ecchymosis. EXAM: CT HEAD WITHOUT CONTRAST CT CERVICAL SPINE WITHOUT CONTRAST TECHNIQUE: Multidetector CT imaging of the head and cervical spine was performed following the standard protocol without intravenous contrast. Multiplanar CT image reconstructions of the cervical spine were also generated. COMPARISON:  01/16/2017 FINDINGS: CT HEAD FINDINGS Brain: There is no intracranial hemorrhage, mass or evidence of acute infarction. There is moderate generalized atrophy. There is moderate chronic microvascular ischemic change. There is no significant extra-axial fluid collection. No acute intracranial findings are evident. Vascular: No hyperdense vessel or unexpected calcification. Skull: Normal. Negative for fracture or focal lesion. Sinuses/Orbits: No acute finding. Other: None. CT CERVICAL SPINE FINDINGS Alignment: Normal. Skull base and  vertebrae: No acute fracture. No primary bone lesion or focal pathologic process. Soft tissues and spinal canal: No prevertebral fluid or swelling. No visible canal hematoma. Disc levels: Moderate cervical degenerative disc changes at C5-6. Facet articulations are arthritic but intact. Upper chest: Negative. Other: None IMPRESSION: 1. No acute intracranial findings. There is moderate generalized atrophy and chronic appearing white matter hypodensities which likely represent small vessel ischemic disease. 2. Negative for acute cervical spine fracture. Electronically Signed   By: Andreas Newport M.D.   On: 01/31/2017 23:44   Ct Cervical Spine Wo Contrast  Result Date: 01/31/2017 CLINICAL DATA:  [place] last night.  Ecchymosis. EXAM: CT HEAD WITHOUT CONTRAST CT CERVICAL SPINE WITHOUT CONTRAST TECHNIQUE: Multidetector CT imaging of the head and cervical spine was performed following the standard protocol without intravenous contrast. Multiplanar  CT image reconstructions of the cervical spine were also generated. COMPARISON:  01/16/2017 FINDINGS: CT HEAD FINDINGS Brain: There is no intracranial hemorrhage, mass or evidence of acute infarction. There is moderate generalized atrophy. There is moderate chronic microvascular ischemic change. There is no significant extra-axial fluid collection. No acute intracranial findings are evident. Vascular: No hyperdense vessel or unexpected calcification. Skull: Normal. Negative for fracture or focal lesion. Sinuses/Orbits: No acute finding. Other: None. CT CERVICAL SPINE FINDINGS Alignment: Normal. Skull base and vertebrae: No acute fracture. No primary bone lesion or focal pathologic process. Soft tissues and spinal canal: No prevertebral fluid or swelling. No visible canal hematoma. Disc levels: Moderate cervical degenerative disc changes at C5-6. Facet articulations are arthritic but intact. Upper chest: Negative. Other: None IMPRESSION: 1. No acute intracranial findings. There  is moderate generalized atrophy and chronic appearing white matter hypodensities which likely represent small vessel ischemic disease. 2. Negative for acute cervical spine fracture. Electronically Signed   By: Andreas Newport M.D.   On: 01/31/2017 23:44   Mr Pelvis Wo Contrast  Result Date: 02/21/2017 CLINICAL DATA:  Sacral decubitus ulcer stage IV. Evaluate for possible osteomyelitis. EXAM: MRI PELVIS WITHOUT CONTRAST TECHNIQUE: Multiplanar multisequence MR imaging of the pelvis was performed. No intravenous contrast was administered. COMPARISON:  None. FINDINGS: Urinary Tract:  The urinary bladder is unremarkable. Bowel:  Unremarkable visualized pelvic bowel loops. Vascular/Lymphatic: No pathologically enlarged lymph nodes. No significant vascular abnormality seen. Reproductive:  No mass or other significant abnormality Other:  Diffuse anasarca about the pelvis and hips. Musculoskeletal: Subtle marrow signal abnormality involving the tip of the coccyx adjacent to a sacral decubitus ulcer raise concern for osteomyelitis, series 7, image 28 and series 6, image 5. Small joint effusion of the left hip. Osteoarthritic joint space narrowing of both hips. No acute fracture of the bony pelvis. The visualized lower lumbar spine demonstrates lumbar spondylosis with multilevel disc space narrowing with osteophytes. IMPRESSION: 1. Diffuse soft tissue edema consistent with anasarca. 2. A sacral decubitus ulcer is noted with marrow signal abnormality involving the tip of the coccyx raising concern for osteomyelitis of the distal coccygeal segment. 3. Lumbar spondylosis. 4. Small left hip joint effusion.  No acute osseous abnormality. Electronically Signed   By: Ashley Royalty M.D.   On: 02/21/2017 18:15   US Abdomen Complete  Result Date: 02/01/2017 CLINICAL DATA:  Abnormal LFTs EXAM: ABDOMEN ULTRASOUND COMPLETE COMPARISON:  Ultrasound 01/08/2005 FINDINGS: Gallbladder: No gallstones or wall thickening visualized. No  sonographic Murphy sign noted by sonographer. Common bile duct: Diameter: 4 mm in diameter within normal limits Liver: No focal lesion identified. Within normal limits in parenchymal echogenicity. IVC: No abnormality visualized. Pancreas: Visualized portion unremarkable. Spleen: Size and appearance within normal limits. Measures 4 cm length Right Kidney: Length: 10.4 cm. Echogenicity within normal limits. No mass or hydronephrosis visualized. Left Kidney: Length: 10.4 cm. Echogenicity within normal limits. No hydronephrosis. There is a left renal cyst measures 9 x 9 mm. Abdominal aorta: No aneurysm visualized. Measures up to 2.2 cm in diameter Other findings: None. IMPRESSION: 1. No gallstones are noted within gallbladder.  Normal CBD. 2. No focal hepatic mass.  Normal liver echogenicity. 3. No hydronephrosis. No renal calculi. Left renal cyst measures 9 mm. 4. No aortic aneurysm. Electronically Signed   By: Lahoma Crocker M.D.   On: 02/01/2017 10:40   Dg Chest Port 1 View  Result Date: 01/31/2017 CLINICAL DATA:  Possible fall last evening.  Altered mental status. EXAM: PORTABLE CHEST 1  VIEW COMPARISON:  01/16/2017 CXR FINDINGS: The heart size and mediastinal contours are within normal limits. Atherosclerosis at the aortic arch without aneurysm. Both lungs are clear. The visualized skeletal structures are unremarkable. IMPRESSION: No active disease. Electronically Signed   By: Ashley Royalty M.D.   On: 01/31/2017 20:41    Breck Hollinger, DO  [facility] Pager [phone]  If 7PM-7AM, please contact night-coverage [email or web address] Password [email or web address] 02/22/2017, 5:35 PM   LOS: 2 days

## 2017-02-22 NOTE — Progress Notes (Addendum)
Patient ID: Andre Ball, male   DOB: 21-Mar-1941, 76 y.o.   MRN: [id number]  [facility] Surgery Progress Note     Subjective: CC- sacral decubitus   Patient resting comfortably in bed. Spoke with his wife, Andre Ball, this morning on the phone and was given verbal consent to debride wound at bedside.  Objective: Vital signs in last 24 hours: Temp:  [98.5 F (36.9 C)-101.5 F (38.6 C)] 99.5 F (37.5 C) (04/27 0642) Pulse Rate:  [67-81] 68 (04/27 0550) Resp:  [16] 16 (04/27 0550) BP: (113-144)/(53-58) 123/58 (04/27 0550) SpO2:  [98 %-100 %] 98 % (04/27 0550) Last BM Date: 02/21/17  Intake/Output from previous day: 04/26 0701 - 04/27 0700 In: 2346.3 [P.O.:540; I.V.:1206.3; IV Piggyback:600] Out: 150 [Urine:150] Intake/Output this shift: No intake/output data recorded.  PE: Gen:  Confused in mittens, NAD, chronically ill appearing, nonverbal during this visit Card:  RRR, no M/G/R heard Pulm:  CTAB, no W/R/R, effort normal Abd: Soft, NT/ND, +BS Sacrum: stage IV decubitus about 5cm in diameter with significant amount of necrotic tissue around edges and deep at the base of the wound, no fluctuance  Lab Results:   Recent Labs  02/21/17 0834 02/22/17 0451  WBC 10.3 14.3*  HGB 8.5* 8.6*  HCT 25.1* 25.3*  PLT 264 251   BMET  Recent Labs  02/21/17 0834 02/22/17 0451  NA 144 142  K 3.5 3.8  CL 110 110  CO2 24 24  GLUCOSE 95 98  BUN 23* 19  CREATININE 0.91 0.91  CALCIUM 8.0* 7.6*   PT/INR  Recent Labs  02/20/17 1535  LABPROT 14.3  INR 1.11   CMP     Component Value Date/Time   NA 142 02/22/2017 0451   K 3.8 02/22/2017 0451   CL 110 02/22/2017 0451   CO2 24 02/22/2017 0451   GLUCOSE 98 02/22/2017 0451   BUN 19 02/22/2017 0451   CREATININE 0.91 02/22/2017 0451   CALCIUM 7.6 (L) 02/22/2017 0451   PROT 6.7 02/20/2017 1535   ALBUMIN 2.7 (L) 02/20/2017 1535   AST 53 (H) 02/20/2017 1535   ALT 33 02/20/2017 1535   ALKPHOS 62 02/20/2017 1535   BILITOT 0.2 (L) 02/20/2017 1535   GFRNONAA >60 02/22/2017 0451   GFRAA >60 02/22/2017 0451   Lipase  No results found for: LIPASE     Studies/Results: Dg Chest 2 View  Result Date: 02/20/2017 CLINICAL DATA:  Fatigue.  Dementia. EXAM: CHEST  2 VIEW COMPARISON:  One-view chest x-ray 01/31/2017. Two-view chest x-ray 01/16/2017. FINDINGS: The heart is mildly enlarged. Lung volumes are low. There is no edema. There is some blunting of the CP angles posteriorly on both sides. Degenerative changes are again seen in the thoracic spine. IMPRESSION: 1. Blunting of posterior CP angle bilaterally appears chronic. No definite effusions. 2. Otherwise negative two view chest x-ray. Electronically Signed   By: Marin Roberts M.D.   On: 02/20/2017 15:24   Mr Pelvis Wo Contrast  Result Date: 02/21/2017 CLINICAL DATA:  Sacral decubitus ulcer stage IV. Evaluate for possible osteomyelitis. EXAM: MRI PELVIS WITHOUT CONTRAST TECHNIQUE: Multiplanar multisequence MR imaging of the pelvis was performed. No intravenous contrast was administered. COMPARISON:  None. FINDINGS: Urinary Tract:  The urinary bladder is unremarkable. Bowel:  Unremarkable visualized pelvic bowel loops. Vascular/Lymphatic: No pathologically enlarged lymph nodes. No significant vascular abnormality seen. Reproductive:  No mass or other significant abnormality Other:  Diffuse anasarca about the pelvis and hips. Musculoskeletal: Subtle marrow signal abnormality involving the tip  of the coccyx adjacent to a sacral decubitus ulcer raise concern for osteomyelitis, series 7, image 28 and series 6, image 5. Small joint effusion of the left hip. Osteoarthritic joint space narrowing of both hips. No acute fracture of the bony pelvis. The visualized lower lumbar spine demonstrates lumbar spondylosis with multilevel disc space narrowing with osteophytes. IMPRESSION: 1. Diffuse soft tissue edema consistent with anasarca. 2. A sacral decubitus ulcer is noted  with marrow signal abnormality involving the tip of the coccyx raising concern for osteomyelitis of the distal coccygeal segment. 3. Lumbar spondylosis. 4. Small left hip joint effusion.  No acute osseous abnormality. Electronically Signed   By: Tollie Eth M.D.   On: 02/21/2017 18:15    Anti-infectives: Anti-infectives    Start     Dose/Rate Route Frequency Ordered Stop   02/21/17 0900  vancomycin (VANCOCIN) IVPB 1000 mg/200 mL premix     1,000 mg 200 mL/hr over 60 Minutes Intravenous Every 12 hours 02/20/17 1928     02/21/17 0400  ceFEPIme (MAXIPIME) 1 g in dextrose 5 % 50 mL IVPB     1 g 100 mL/hr over 30 Minutes Intravenous Every 8 hours 02/20/17 1927     02/20/17 2100  vancomycin (VANCOCIN) 1,500 mg in sodium chloride 0.9 % 500 mL IVPB     1,500 mg 250 mL/hr over 120 Minutes Intravenous  Once 02/20/17 1928 02/21/17 0058   02/20/17 2000  ceFEPIme (MAXIPIME) 2 g in dextrose 5 % 50 mL IVPB     2 g 100 mL/hr over 30 Minutes Intravenous  Once 02/20/17 1927 02/20/17 2226       Assessment/Plan Fever with probable sepsis 5 cm  X 4 cm deep sacral decubitus  - MR showed a sacral decubitus ulcer with marrow signal abnormality involving the tip of the coccyx raising concern for osteomyelitis of the distal coccygeal segment  - Verbal consent from wife obtained for bedside debride of sacral decubitus.  Procedure note:  In a semi-sterile fashion the wound was prepped with betadine and necrotic tissue was debrided (type: excisional) from wound edges (depth: skin and subcutaneous tissues) and base using scalpel and scissors.   Debrided down to healthy tissue as best as I was able. Santyl applied to wound edges. Wound repacked with saline dampened kerlex and covered with dry gauze and ABD pad. Patient tolerated procedure well.  Dementia and confused/non verbal this PM BPH Hx of asthma Malnutrition and deconditioning  Plan:  Wound debrided at bedside.   Continue BID wet to dry dressing  changes and hydrotherapy.   Continue air mattress and rotate patient frequently to decrease pressure from sacrum.    Continue working on Press photographer.   LOS: 2 days    Edson Snowball , [facility] Surgery 02/22/2017, 9:24 AM Pager: [phone] Consults: [phone] Mon-Fri 7:00 am-4:30 pm Sat-Sun 7:00 am-11:30 am  Agree with above. Will see again on Monday, 4/30, unless problem.  Ovidio Kin, MD, [facility] Surgery Pager: [phone] Office phone:  [phone]

## 2017-02-22 NOTE — Consult Note (Signed)
Consultation Note Date: 02/22/2017   Patient Name: Andre Ball  DOB: 12/08/1940  MRN: [id number]  Age / Sex: 76 y.o., male  PCP: Iona Beard, MD Referring Physician: Orson Eva, MD  Reason for Consultation: Establishing goals of care  HPI/Patient Profile: 76 y.o. male  with past medical history of dementia, BPH, htn, hlp, alzheimers demetia, prior sacral decubitus admitted on 02/20/2017 from SNF with anemia and infected sacral decubitus ulcer with concern for osteo.  Family has bee inquiring about feeding tube.  Palliative consulted for goals of care.   Clinical Assessment and Goals of Care: I met today with patient's wife and oldest son.  He has advanced dementia and is unable to participate in conversation.  His family reports that the most important thing to him is his family, including his 3 grandchildren. 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  We discussed his clinical course, including the fact that he has alzheimers, which is a chronic, progressive, terminal illness.  His family is concerned with his nutrition and has been inquiring about a feeding tube.  We discussed risk vs benefit of PEG tubes in advanced dementia and that they do not stop risk of aspiration, nor have they been shown to improve functional status in this population.  His family reports understanding and is hopeful that he will be restarted on diet soon.  We reviewed a MOST form and discussed how to develop plan of care to focus on continuing therapies that would maximize chance of being well enough to be out of the hospital and limiting therapies not in line with this goal.  Also discussed that the hospital can be useful as long as he is getting well enough from care he receives at the hospital to enjoy his time out of the hospital, but we are approaching the point where, if his goal is to be out of the hospital,  he may be better served to plan on being at home and bringing care to him at home rather repeated trips to the hospital. We discussed hospice as a tool that may be beneficial in this goal as he reaches a point where we are trying to fix problems that are not fixable.  His wife seems to be overwhelmed with discussion at times, but his son is very engaged and understands situation very clearly.  SUMMARY OF RECOMMENDATIONS   - DNR/DNI - Discussed care plan at length with son and wife.  His son seems to understand situation more clearly and reports he will continue to discuss with his mother. - We discussed MOST form.  They will review and would like to meet again on Sunday to complete. - Family would like to continue current interventions to see if he continues to improve. - Would recommend advance diet as possible.  I explained in detail to family why I do not feel he is likely to benefit from PEG, particularly if we can resume his diet soon.  They report agreement with this at this  point, but would like to discuss further if he is not going to be able to resume PO intake. - F/u on Sunday.  Family to call with time.  Code Status/Advance Care Planning:  DNR  Psycho-social/Spiritual:   Desire for further Chaplaincy support:no  Additional Recommendations: Education on Hospice  Prognosis:   Unable to determine  Discharge Planning: [facility] for rehab with Palliative care service follow-up most likely     Primary Diagnoses: Present on Admission: . Acute blood loss anemia . Hypernatremia . Essential hypertension . Dementia   I have reviewed the medical record, interviewed the patient and family, and examined the patient. The following aspects are pertinent.  Past Medical History:  Diagnosis Date  . Arthritis   . Asthma   . BPH (benign prostatic hyperplasia)   . Memory deficits 11/01/2014   Social History   Social History  . Marital status: Married    Spouse  name: N/A  . Number of children: 2  . Years of education: college de   Occupational History  . Retired    Social History Main Topics  . Smoking status: Former Research scientist (life sciences)  . Smokeless tobacco: Never Used  . Alcohol use No     Comment: occasionally  . Drug use: No  . Sexual activity: Not Asked     Comment: Married   Other Topics Concern  . None   Social History Narrative   Lives at home w/ his wife   Patient is right handed.   Patient does not drink caffeine.   Family History  Problem Relation Age of Onset  . Alzheimer's disease Mother   . Alzheimer's disease Father   . Diabetes Brother   . Alzheimer's disease Sister   . Heart disease Brother   . Diabetes Brother   . Alzheimer's disease Sister   . Alzheimer's disease Brother   . Alzheimer's disease Brother    Scheduled Meds: . collagenase   Topical Daily  . donepezil  10 mg Oral QHS  . feeding supplement (ENSURE ENLIVE)  237 mL Oral BID BM  . finasteride  5 mg Oral Daily  . fluticasone  2 spray Each Nare Daily  . memantine  10 mg Oral BID  . montelukast  10 mg Oral QHS  . multivitamin with minerals  1 tablet Oral Daily  . omega-3 acid ethyl esters  1 g Oral Daily  . pantoprazole sodium  40 mg Oral BID  . protein supplement  2 oz Oral QID  . risperiDONE  1 mg Oral BID  . sertraline  25 mg Oral Daily  . tamsulosin  0.4 mg Oral Daily  . vitamin B-12  500 mcg Oral Daily   Continuous Infusions: . 0.45 % NaCl with KCl 20 mEq / L 75 mL/hr at 02/22/17 0933  . sodium chloride    . ceFEPime (MAXIPIME) IV 1 g (02/22/17 1226)  . vancomycin 1,000 mg (02/22/17 0934)   PRN Meds:.acetaminophen **OR** acetaminophen, nitroGLYCERIN, ondansetron **OR** ondansetron (ZOFRAN) IV, oxyCODONE-acetaminophen Medications Prior to Admission:  Prior to Admission medications   Medication Sig Start Date End Date Taking? Authorizing Provider  aspirin EC 81 MG tablet Take 81 mg by mouth daily.   Yes Historical Provider, MD  collagenase  (SANTYL) ointment Apply 1 application topically daily. Patient taking differently: Apply 1 application topically every evening. Pt applies to right heel and sacrum. 02/04/17  Yes Doreatha Lew, MD  diclofenac sodium (VOLTAREN) 1 % GEL Apply 1 application topically daily as needed (  for leg/shoulder pain).    Yes Historical Provider, MD  donepezil (ARICEPT) 10 MG tablet Take 1 tablet (10 mg total) by mouth at bedtime. 08/20/16  Yes Kathrynn Ducking, MD  feeding supplement, ENSURE ENLIVE, (ENSURE ENLIVE) LIQD Take 237 mLs by mouth 2 (two) times daily between meals. 02/04/17  Yes Doreatha Lew, MD  finasteride (PROSCAR) 5 MG tablet Take 5 mg by mouth daily.   Yes Historical Provider, MD  fluticasone (FLONASE) 50 MCG/ACT nasal spray Place 2 sprays into the nose daily. 01/30/13  Yes Harden Mo, MD  lisinopril (PRINIVIL,ZESTRIL) 10 MG tablet Take 10 mg by mouth daily.   Yes Historical Provider, MD  memantine (NAMENDA) 10 MG tablet Take 1 tablet (10 mg total) by mouth 2 (two) times daily. 08/20/16  Yes Kathrynn Ducking, MD  montelukast (SINGULAIR) 10 MG tablet Take 10 mg by mouth at bedtime.   Yes Historical Provider, MD  Multiple Vitamin (MULTIVITAMIN WITH MINERALS) TABS tablet Take 1 tablet by mouth daily.   Yes Historical Provider, MD  nitroGLYCERIN (NITROSTAT) 0.4 MG SL tablet Place 0.4 mg under the tongue every 5 (five) minutes as needed for chest pain.   Yes Historical Provider, MD  omega-3 acid ethyl esters (LOVAZA) 1 g capsule Take 1 g by mouth daily.   Yes Historical Provider, MD  oxaprozin (DAYPRO) 600 MG tablet Take 1 tablet (600 mg total) by mouth 2 (two) times daily. 11/13/16  Yes Gardiner Barefoot, DPM  oxyCODONE-acetaminophen (PERCOCET/ROXICET) 5-325 MG tablet Take 1 tablet by mouth every 6 (six) hours as needed for moderate pain. 02/04/17  Yes Doreatha Lew, MD  risperiDONE (RISPERDAL) 1 MG tablet Take 1 mg by mouth 2 (two) times daily.   Yes Historical Provider, MD  sertraline (ZOLOFT)  25 MG tablet Take 25 mg by mouth daily.   Yes Historical Provider, MD  sodium hypochlorite (DAKIN'S 1/4 STRENGTH) 0.125 % SOLN Apply 1 application topically 2 (two) times daily. Pt applies to sacrum.   Yes Historical Provider, MD  tamsulosin (FLOMAX) 0.4 MG CAPS Take 0.4 mg by mouth daily.   Yes Historical Provider, MD  vitamin B-12 (CYANOCOBALAMIN) 500 MCG tablet Take 500 mcg by mouth daily.   Yes Historical Provider, MD   No Known Allergies Review of Systems  Unable to obtain  Physical Exam General: Confused, awake, in no acute distress.  HEENT: No bruits, no goiter, no JVD Heart: Regular rate and rhythm. No murmur appreciated. Lungs: Good air movement, clear Abdomen: Soft, nontender, nondistended, positive bowel sounds.  Skin: Warm and dry: Ulcers not examined (had prior wound care today). Neuro: Does not reliably follow commands   Vital Signs: BP (!) 112/53 (BP Location: Left Arm)   Pulse 66   Temp 98.8 F (37.1 C) (Oral)   Resp 18   Ht '5\' 11"'  (1.803 m)   Wt 73 kg (161 lb)   SpO2 98%   BMI 22.45 kg/m  Pain Assessment: No/denies pain   Pain Score: Asleep   SpO2: SpO2: 98 % O2 Device:SpO2: 98 % O2 Flow Rate: .   IO: Intake/output summary:  Intake/Output Summary (Last 24 hours) at 02/22/17 1557 Last data filed at 02/22/17 0950  Gross per 24 hour  Intake          2253.75 ml  Output              150 ml  Net          2103.75 ml    LBM: Last  BM Date: 02/21/17 Baseline Weight: Weight: 73 kg (161 lb) Most recent weight: Weight: 73 kg (161 lb)     Palliative Assessment/Data:   Flowsheet Rows     Most Recent Value  Intake Tab  Referral Department  Hospitalist  Unit at Time of Referral  Med/Surg Unit  Palliative Care Primary Diagnosis  Neurology  Date Notified  02/21/17  Palliative Care Type  New Palliative care  Reason for referral  Clarify Goals of Care  Date of Admission  02/20/17  Date first seen by Palliative Care  02/21/17  # of days Palliative referral  response time  0 Day(s)  # of days IP prior to Palliative referral  1  Clinical Assessment  Palliative Performance Scale Score  30%  Pain Max last 24 hours  Not able to report  Pain Min Last 24 hours  Not able to report  Psychosocial & Spiritual Assessment  Palliative Care Outcomes  Patient/Family meeting held?  Yes  Who was at the meeting?  Patient wife and son  Palliative Care Outcomes  ACP counseling assistance, Clarified goals of care      Time In: 28 Time Out: 1410 Time Total: 85 Greater than 50%  of this time was spent counseling and coordinating care related to the above assessment and plan.  Signed by: Micheline Rough, MD   Please contact Palliative Medicine Team phone at [phone] for questions and concerns.  For individual provider: See Shea Evans

## 2017-02-22 NOTE — Progress Notes (Signed)
Per discussion with the patient's nurse, he had a large, dark stool. However, his hemoglobin is stable at 8.6, and his BUN is actually improved and normal, so I believe that the stool reflects delayed passage of old blood, rather than evidence of active or ongoing bleeding.  The patient is taking small amounts by mouth, but they are crushing his medications.  This might adversely affect the enteric coating on his pantoprazole, so I am going to switch to pantoprazole suspension.  Impression:  1. Subacute GI bleed, presumably the result of aspirin gastropathy  2. Posthemorrhagic anemia, acute on chronic, stable  3. Dementia, decubitus ulcerations, nursing home patient  Recommendation:  1. As per prior notes, our current plan, consistent with patient's wife's preferences, is empiric therapy rather than endoscopic evaluation, unless the patient's clinical evolution demands otherwise (for example, severe or destabilizing bleeding)  2. I will convert the patient's pantoprazole to oral suspension formulation, instead of tablet.  3. Please see my progress note from yesterday for other recommendations.  4. We will sign off, but please feel free to call us if there are questions or if we can be of further assistance in this patient's care.  Florencia Reasons, M.D. Pager [phone] If no answer or after 5 PM call [phone]

## 2017-02-23 LAB — PREALBUMIN: PREALBUMIN: 8.7 mg/dL — AB (ref 18–38)

## 2017-02-23 LAB — BASIC METABOLIC PANEL
Anion gap: 9 (ref 5–15)
BUN: 14 mg/dL (ref 6–20)
CO2: 23 mmol/L (ref 22–32)
Calcium: 7.6 mg/dL — ABNORMAL LOW (ref 8.9–10.3)
Chloride: 106 mmol/L (ref 101–111)
Creatinine, Ser: 0.9 mg/dL (ref 0.61–1.24)
GFR calc non Af Amer: >60 mL/min (ref >60)
GLUCOSE: 102 mg/dL — AB (ref 65–99)
POTASSIUM: 3.8 mmol/L (ref 3.5–5.1)
Sodium: 138 mmol/L (ref 135–145)

## 2017-02-23 LAB — CBC
HCT: 25.4 % — ABNORMAL LOW (ref 39.0–52.0)
Hemoglobin: 8.4 g/dL — ABNORMAL LOW (ref 13.0–17.0)
MCH: 30.9 pg (ref 26.0–34.0)
MCHC: 33.1 g/dL (ref 30.0–36.0)
MCV: 93.4 fL (ref 78.0–100.0)
Platelets: 294 10*3/uL (ref 150–400)
RBC: 2.72 MIL/uL — ABNORMAL LOW (ref 4.22–5.81)
RDW: 14.5 % (ref 11.5–15.5)
WBC: 13.2 10*3/uL — ABNORMAL HIGH (ref 4.0–10.5)

## 2017-02-23 LAB — VANCOMYCIN, TROUGH: Vancomycin Tr: 18 ug/mL (ref 15–20)

## 2017-02-23 LAB — MAGNESIUM: Magnesium: 1.9 mg/dL (ref 1.7–2.4)

## 2017-02-23 NOTE — Progress Notes (Signed)
PROGRESS NOTE  Andre Ball VUD:[id number] DOB: 04-Mar-1941 DOA: 02/20/2017 PCP: Maggie Font, MD Brief History: 76 y.o.malewith medical history of dementia, BPH, hypertension, hyperlipidemia presenting from the nursing home secondary to fatigue. Unfortunately, the patient has a history of dementia, and he is unable to provide any history. All of this history is obtained from review of the medical record in speaking with the patient's wife. The patient was recently discharged from the hospital after a stay from 01/31/2017 through 02/04/2017 during which time the patient was treated for rhabdomyolysis secondary to frequent falls as well as his sacral and foot decubitus. The patient was discharged in stable condition to skilled nursing facility. His hemoglobin was 11.6 at the time of discharge. The patient was noted to have increasing fatigue and lethargy. Routine blood work was done and showed a hemoglobin in the 5 range. As a result, the patient was brought to the emergency department for further evaluation. There's been no recent hematemesis, hematuria, hematochezia, melena per his wife. The patient has not been started on any NSAIDs recently. There's been no fevers, chills, chest pain, abdominal pain.  Assessment/Plan: Acute Blood Loss Anemia/Heme Positive Stool/Symptomatic Anemia -Concerned about GI bleed -question NSAID induced--pt on DayPro as noted on MAR -appreciate Dr. 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 -This ulcer was present prior to his last hospitalization -bone palpable on exam with necrotic tissue--likely underlying osteomyelitis -Continueempiric  vancomycin/ceftriaxone/metronidazole, D#4 antibiotics -02/21/17 MRI pelvis--tip of coccyx with abnormal marrow signal suggesting osteomyelitis, small left hip effusion  -ESR--60, CRP 19.3 -appreciate general surgery--bedside debridement on 4/27 -spouse seems "shocked"; under impression this happened "so quickly" -afebrile in past 24 hours -blood cultures x 2 sets--neg to date -continue daily hydrotherapy  Acute metabolic encephalopathy -Secondary to blood loss anemia and infectious process -Spouse appears to be surprised this happened "so quickly" -remains encephalopathic, but more interactive  Right heel decubitus -Not infected on exam -Appreciatewound care nurse  Hypertension -Holding lisinopril -BP acceptable  Hypernatremia -improving with IVF -Continue1/2NS  Dementia -continue aricept and namenda  Hyperglycemia -A1C--5.2 -likely stress induced hyperglycemia  BPH -continue tamulosin and finasteride  Goals of Care -pt is DNR -appreciate palliative medicine -son appears to have better understanding of severity of clinical situation and potential poor outcomes    Disposition Plan: SNF in 2-3  days  Family Communication: No family at bedside  Consultants: [facility]; general surgery; palliative medicine  Code Status: FULL   DVT Prophylaxis: SCDs   Procedures: As Listed in Progress Note Above  Antibiotics: Vancomycin 4/25>>> Cefepime 4/25>>>4/27 Ceftriaxone 4/27>> Metronidazole 4/27>>>     Subjective: Patient is more alert and interactive. Denies any chest pain, shortness breath, leg pain. Denies any nausea, vomiting, abdominal pain. No reported diarrhea.  Objective: Vitals:   02/22/17 1511 02/22/17 2145 02/23/17 0409 02/23/17 1300  BP: (!) 112/53 (!) 115/48 109/61 (!) 121/58  Pulse: 66 64 (!) 54 60  Resp: '18 18 16 18  ' Temp: 98.8 F (37.1 C) 99.3 F (37.4 C) 98.8 F (37.1 C) 98.4 F (36.9 C)  TempSrc: Oral Oral Oral  Oral  SpO2:  98% 99% 98%  Weight:      Height:        Intake/Output Summary (Last  24 hours) at 02/23/17 1641 Last data filed at 02/23/17 1300  Gross per 24 hour  Intake             2395 ml  Output             1076 ml  Net             1319 ml   Weight change:  Exam:   General:  Pt is alert, not in acute distress  HEENT: No icterus, No thrush, No neck mass, [place]/AT  Cardiovascular: RRR, S1/S2, no rubs, no gallops  Respiratory: Diminished breath sounds at the bases. No wheezing.  Abdomen: Soft/+BS, non tender, non distended, no guarding  Extremities: No edema, No lymphangitis, No petechiae, No rashes, no synovitis   Data Reviewed: I have personally reviewed following labs and imaging studies Basic Metabolic Panel:  Recent Labs Lab 02/20/17 1535 02/21/17 0834 02/22/17 0451 02/23/17 0427  NA 145 144 142 138  K 3.7 3.5 3.8 3.8  CL 110 110 110 106  CO2 '24 24 24 23  ' GLUCOSE 154* 95 98 102*  BUN 27* 23* 19 14  CREATININE 1.07 0.91 0.91 0.90  CALCIUM 8.5* 8.0* 7.6* 7.6*  MG  --   --   --  1.9   Liver Function Tests:  Recent Labs Lab 02/20/17 1535  AST 53*  ALT 33  ALKPHOS 62  BILITOT 0.2*  PROT 6.7  ALBUMIN 2.7*   No results for input(s): LIPASE, AMYLASE in the last 168 hours. No results for input(s): AMMONIA in the last 168 hours. Coagulation Profile:  Recent Labs Lab 02/20/17 1535  INR 1.11   CBC:  Recent Labs Lab 02/20/17 1535 02/21/17 0834 02/22/17 0451 02/23/17 0427  WBC 10.9* 10.3 14.3* 13.2*  NEUTROABS 9.7*  --   --   --   HGB 5.8* 8.5* 8.6* 8.4*  HCT 17.2* 25.1* 25.3* 25.4*  MCV 95.6 91.6 92.0 93.4  PLT 324 264 251 294   Cardiac Enzymes: No results for input(s): CKTOTAL, CKMB, CKMBINDEX, TROPONINI in the last 168 hours. BNP: Invalid input(s): POCBNP CBG: No results for input(s): GLUCAP in the last 168 hours. HbA1C:  Recent Labs  02/21/17 0834  HGBA1C 5.2   Urine analysis:    Component Value Date/Time   COLORURINE YELLOW  02/21/2017 0125   APPEARANCEUR HAZY (A) 02/21/2017 0125   LABSPEC 1.024 02/21/2017 0125   PHURINE 5.0 02/21/2017 0125   GLUCOSEU NEGATIVE 02/21/2017 0125   HGBUR NEGATIVE 02/21/2017 0125   BILIRUBINUR NEGATIVE 02/21/2017 0125   KETONESUR NEGATIVE 02/21/2017 0125   PROTEINUR NEGATIVE 02/21/2017 0125   NITRITE NEGATIVE 02/21/2017 0125   LEUKOCYTESUR NEGATIVE 02/21/2017 0125   Sepsis Labs: '@LABRCNTIP' (procalcitonin:4,lacticidven:4) ) Recent Results (from the past 240 hour(s))  MRSA PCR Screening     Status: None   Collection Time: 02/21/17  6:32 AM  Result Value Ref Range Status   MRSA by PCR NEGATIVE NEGATIVE Final    Comment:        The GeneXpert MRSA Assay (FDA approved for NASAL specimens only), is one component of a comprehensive MRSA colonization surveillance program. It is not intended to diagnose MRSA infection nor to guide or monitor treatment for MRSA infections.   Culture, blood (Routine X 2) w Reflex to ID Panel     Status: None (Preliminary result)   Collection Time: 02/22/17  2:56 PM  Result Value Ref Range Status   Specimen Description BLOOD LEFT ANTECUBITAL  Final   Special Requests IN PEDIATRIC  BOTTLE Blood Culture adequate volume  Final   Culture   Final    NO GROWTH < 24 HOURS Performed at [facility], [place] [address] [place], [place] [address]    Report Status PENDING  Incomplete  Culture, blood (Routine X 2) w Reflex to ID Panel     Status: None (Preliminary result)   Collection Time: 02/22/17  2:59 PM  Result Value Ref Range Status   Specimen Description BLOOD RIGHT HAND  Final   Special Requests IN PEDIATRIC BOTTLE Blood Culture adequate volume  Final   Culture   Final    NO GROWTH < 24 HOURS Performed at [facility], [place] [address], [place], [place] [address]    Report Status PENDING  Incomplete     Scheduled Meds: . collagenase   Topical Daily  . donepezil  10 mg Oral QHS  . feeding supplement (ENSURE ENLIVE)  237 mL Oral BID  BM  . finasteride  5 mg Oral Daily  . fluticasone  2 spray Each Nare Daily  . memantine  10 mg Oral BID  . montelukast  10 mg Oral QHS  . multivitamin with minerals  1 tablet Oral Daily  . omega-3 acid ethyl esters  1 g Oral Daily  . pantoprazole sodium  40 mg Oral BID  . protein supplement  2 oz Oral QID  . risperiDONE  1 mg Oral BID  . sertraline  25 mg Oral Daily  . tamsulosin  0.4 mg Oral Daily  . vitamin B-12  500 mcg Oral Daily   Continuous Infusions: . 0.45 % NaCl with KCl 20 mEq / L 75 mL/hr at 02/23/17 0020  . cefTRIAXone (ROCEPHIN)  IV Stopped (02/22/17 1920)  . metronidazole Stopped (02/23/17 1011)  . vancomycin Stopped (02/23/17 1200)    Procedures/Studies: Dg Chest 2 View  Result Date: 02/20/2017 CLINICAL DATA:  Fatigue.  Dementia. EXAM: CHEST  2 VIEW COMPARISON:  One-view chest x-ray 01/31/2017. Two-view chest x-ray 01/16/2017. FINDINGS: The heart is mildly enlarged. Lung volumes are low. There is no edema. There is some blunting of the CP angles posteriorly on both sides. Degenerative changes are again seen in the thoracic spine. IMPRESSION: 1. Blunting of posterior CP angle bilaterally appears chronic. No definite effusions. 2. Otherwise negative two view chest x-ray. Electronically Signed   By: San Morelle M.D.   On: 02/20/2017 15:24   Dg Pelvis 1-2 Views  Result Date: 01/31/2017 CLINICAL DATA:  Fall with buttock pain EXAM: PELVIS - 1-2 VIEW COMPARISON:  None. FINDINGS: SI joints are symmetric bilaterally. The pubic symphysis appears intact. Both femoral heads appear normally positioned. IMPRESSION: No definite acute osseous abnormality Electronically Signed   By: Donavan Foil M.D.   On: 01/31/2017 23:42   Ct Head Wo Contrast  Result Date: 01/31/2017 CLINICAL DATA:  [place] last night.  Ecchymosis. EXAM: CT HEAD WITHOUT CONTRAST CT CERVICAL SPINE WITHOUT CONTRAST TECHNIQUE: Multidetector CT imaging of the head and cervical spine was performed following the  standard protocol without intravenous contrast. Multiplanar CT image reconstructions of the cervical spine were also generated. COMPARISON:  01/16/2017 FINDINGS: CT HEAD FINDINGS Brain: There is no intracranial hemorrhage, mass or evidence of acute infarction. There is moderate generalized atrophy. There is moderate chronic microvascular ischemic change. There is no significant extra-axial fluid collection. No acute intracranial findings are evident. Vascular: No hyperdense vessel or unexpected calcification. Skull: Normal. Negative for fracture or focal lesion. Sinuses/Orbits: No acute finding. Other: None. CT CERVICAL  SPINE FINDINGS Alignment: Normal. Skull base and vertebrae: No acute fracture. No primary bone lesion or focal pathologic process. Soft tissues and spinal canal: No prevertebral fluid or swelling. No visible canal hematoma. Disc levels: Moderate cervical degenerative disc changes at C5-6. Facet articulations are arthritic but intact. Upper chest: Negative. Other: None IMPRESSION: 1. No acute intracranial findings. There is moderate generalized atrophy and chronic appearing white matter hypodensities which likely represent small vessel ischemic disease. 2. Negative for acute cervical spine fracture. Electronically Signed   By: Andreas Newport M.D.   On: 01/31/2017 23:44   Ct Cervical Spine Wo Contrast  Result Date: 01/31/2017 CLINICAL DATA:  [place] last night.  Ecchymosis. EXAM: CT HEAD WITHOUT CONTRAST CT CERVICAL SPINE WITHOUT CONTRAST TECHNIQUE: Multidetector CT imaging of the head and cervical spine was performed following the standard protocol without intravenous contrast. Multiplanar CT image reconstructions of the cervical spine were also generated. COMPARISON:  01/16/2017 FINDINGS: CT HEAD FINDINGS Brain: There is no intracranial hemorrhage, mass or evidence of acute infarction. There is moderate generalized atrophy. There is moderate chronic microvascular ischemic change. There is no  significant extra-axial fluid collection. No acute intracranial findings are evident. Vascular: No hyperdense vessel or unexpected calcification. Skull: Normal. Negative for fracture or focal lesion. Sinuses/Orbits: No acute finding. Other: None. CT CERVICAL SPINE FINDINGS Alignment: Normal. Skull base and vertebrae: No acute fracture. No primary bone lesion or focal pathologic process. Soft tissues and spinal canal: No prevertebral fluid or swelling. No visible canal hematoma. Disc levels: Moderate cervical degenerative disc changes at C5-6. Facet articulations are arthritic but intact. Upper chest: Negative. Other: None IMPRESSION: 1. No acute intracranial findings. There is moderate generalized atrophy and chronic appearing white matter hypodensities which likely represent small vessel ischemic disease. 2. Negative for acute cervical spine fracture. Electronically Signed   By: Andreas Newport M.D.   On: 01/31/2017 23:44   Mr Pelvis Wo Contrast  Result Date: 02/21/2017 CLINICAL DATA:  Sacral decubitus ulcer stage IV. Evaluate for possible osteomyelitis. EXAM: MRI PELVIS WITHOUT CONTRAST TECHNIQUE: Multiplanar multisequence MR imaging of the pelvis was performed. No intravenous contrast was administered. COMPARISON:  None. FINDINGS: Urinary Tract:  The urinary bladder is unremarkable. Bowel:  Unremarkable visualized pelvic bowel loops. Vascular/Lymphatic: No pathologically enlarged lymph nodes. No significant vascular abnormality seen. Reproductive:  No mass or other significant abnormality Other:  Diffuse anasarca about the pelvis and hips. Musculoskeletal: Subtle marrow signal abnormality involving the tip of the coccyx adjacent to a sacral decubitus ulcer raise concern for osteomyelitis, series 7, image 28 and series 6, image 5. Small joint effusion of the left hip. Osteoarthritic joint space narrowing of both hips. No acute fracture of the bony pelvis. The visualized lower lumbar spine demonstrates lumbar  spondylosis with multilevel disc space narrowing with osteophytes. IMPRESSION: 1. Diffuse soft tissue edema consistent with anasarca. 2. A sacral decubitus ulcer is noted with marrow signal abnormality involving the tip of the coccyx raising concern for osteomyelitis of the distal coccygeal segment. 3. Lumbar spondylosis. 4. Small left hip joint effusion.  No acute osseous abnormality. Electronically Signed   By: Ashley Royalty M.D.   On: 02/21/2017 18:15   US Abdomen Complete  Result Date: 02/01/2017 CLINICAL DATA:  Abnormal LFTs EXAM: ABDOMEN ULTRASOUND COMPLETE COMPARISON:  Ultrasound 01/08/2005 FINDINGS: Gallbladder: No gallstones or wall thickening visualized. No sonographic Murphy sign noted by sonographer. Common bile duct: Diameter: 4 mm in diameter within normal limits Liver: No focal lesion identified. Within normal limits in parenchymal  echogenicity. IVC: No abnormality visualized. Pancreas: Visualized portion unremarkable. Spleen: Size and appearance within normal limits. Measures 4 cm length Right Kidney: Length: 10.4 cm. Echogenicity within normal limits. No mass or hydronephrosis visualized. Left Kidney: Length: 10.4 cm. Echogenicity within normal limits. No hydronephrosis. There is a left renal cyst measures 9 x 9 mm. Abdominal aorta: No aneurysm visualized. Measures up to 2.2 cm in diameter Other findings: None. IMPRESSION: 1. No gallstones are noted within gallbladder.  Normal CBD. 2. No focal hepatic mass.  Normal liver echogenicity. 3. No hydronephrosis. No renal calculi. Left renal cyst measures 9 mm. 4. No aortic aneurysm. Electronically Signed   By: Lahoma Crocker M.D.   On: 02/01/2017 10:40   Dg Chest Port 1 View  Result Date: 01/31/2017 CLINICAL DATA:  Possible fall last evening.  Altered mental status. EXAM: PORTABLE CHEST 1 VIEW COMPARISON:  01/16/2017 CXR FINDINGS: The heart size and mediastinal contours are within normal limits. Atherosclerosis at the aortic arch without aneurysm. Both  lungs are clear. The visualized skeletal structures are unremarkable. IMPRESSION: No active disease. Electronically Signed   By: Ashley Royalty M.D.   On: 01/31/2017 20:41    Keyston Ardolino, DO  [facility] Pager [phone]  If 7PM-7AM, please contact night-coverage [email or web address] Password [email or web address] 02/23/2017, 4:41 PM   LOS: 3 days

## 2017-02-23 NOTE — Progress Notes (Signed)
HYDROTHERAPY TREATMENT     02/23/17 1200  Subjective Assessment  Subjective "Some pain in my hips"  Patient and Family Stated Goals pt unable-no family present  Date of Onset (Present on admission)  Prior Treatments unknown  Evaluation and Treatment  Evaluation and Treatment Procedures Explained to Patient/Family Yes  Evaluation and Treatment Procedures Patient unable to consent due to mental status  Pressure Injury 02/22/17 Unstageable - Full thickness tissue loss in which the base of the ulcer is covered by slough (yellow, tan, gray, green or brown) and/or eschar (tan, brown or black) in the wound bed.  Date First Assessed/Time First Assessed: 02/22/17 1345   Location: Sacrum  Staging: Unstageable - Full thickness tissue loss in which the base of the ulcer is covered by slough (yellow, tan, gray, green or brown) and/or eschar (tan, brown or black) in...  Dressing Type Moist to moist; Saline Kerlix packing; Gauze (Santyl)  Dressing Change Frequency Daily  % Wound base Black/Eschar 100%  Peri-wound Assessment Maceration;Excoriated;Bleeding;Pink  Drainage Amount Moderate  Drainage Description Odor;Purulent  Treatment Debridement (Selective);Hydrotherapy (Pulse lavage);Packing (Saline gauze) (Enzymatic debridement)  Hydrotherapy  Pulsed lavage therapy - wound location sacrum  Pulsed Lavage with Suction (psi) 8 psi  Pulsed Lavage with Suction - Normal Saline Used 1000 mL  Pulsed Lavage Tip Tip with splash shield  Selective Debridement  Selective Debridement - Location sacrum  Selective Debridement - Tools Used Forceps;Scissors  Selective Debridement - Tissue Removed black necrotic tissue  Wound Therapy - Assess/Plan/Recommendations  Wound Therapy - Clinical Statement 76 yo male admitted with anemia. Hx of dementia. Mostly bedbound. Bedside debridement by surgery 02/22/17  Wound Therapy - Functional Problem List bedbound  Factors Delaying/Impairing Wound Healing Infection -  systemic/local;Incontinence;Immobility  Hydrotherapy Plan Debridement;Pulsatile lavage with suction;Patient/family education;Dressing change  Wound Therapy - Frequency 6X / week  Wound Therapy - Current Recommendations Case manager/social work  Wound Therapy - Follow Up Recommendations Skilled nursing facility  Wound Plan Hydrotherapy and enzymatic debridement to facilitate wound healing.  Very foul odor and purulent drainage noted on today. Left sticky note requesting order for [employer] use if MD/WOC agree.   Wound Therapy Goals - Improve the function of patient's integumentary system by progressing the wound(s) through the phases of wound healing by:  Decrease Necrotic Tissue to 50  Decrease Necrotic Tissue - Progress Progressing toward goal  Increase Granulation Tissue to 50  Increase Granulation Tissue - Progress Progressing toward goal  Goals/treatment plan/discharge plan were made with and agreed upon by patient/family No, Patient unable to participate in goals/treatment/discharge plan and family unavailable  Time For Goal Achievement 2 weeks  Wound Therapy - Potential for Goals Bolivar Haw, MPT [phone]

## 2017-02-23 NOTE — Progress Notes (Addendum)
Pharmacy Antibiotic Follow-up Note  Andre Ball is a 76 y.o. year-old male admitted on 02/20/2017.  The patient is currently on day 1 of Vancomycin & Cefepime for osteomyelitis.  Today, 02/23/2017  Day #3 antibiotics  VT at goal   WBC improved, afebrile   Assessment/Plan:  Continue vancomycin 1gm IV q12h (goal trough 15-20 mcg/mL for osteomyelitis).   Monitor renal function  Ceftriaxone and Flagyl per MD, dosing appropriate  Temp (24hrs), Avg:98.8 F (37.1 C), Min:98.4 F (36.9 C), Max:99.3 F (37.4 C)   Recent Labs Lab 02/20/17 1535 02/21/17 0834 02/22/17 0451 02/23/17 0427  WBC 10.9* 10.3 14.3* 13.2*     Recent Labs Lab 02/20/17 1535 02/21/17 0834 02/22/17 0451 02/23/17 0427  CREATININE 1.07 0.91 0.91 0.90   Estimated Creatinine Clearance: 74.4 mL/min (by C-G formula based on SCr of 0.9 mg/dL).    No Known Allergies   Antimicrobials this admission: 4/25 Cefepime >> 4/27 4/25 Vancomycin >>  4/27 Rocephin >> 4/27 Flagyl >>   Levels/dose changes this admission: 4/28 0930 VT = 18 mcg/mL on 1gm q12h (prior to 6th overall dose)  Microbiology results: 4/26 MRSA PCR: neg 4/27 BCx: ngtd   Thank you for allowing pharmacy to be a part of this patient's care.  Bernadene Person, PharmD, BCPS Pager: [phone] 02/23/2017, 2:20 PM

## 2017-02-24 DIAGNOSIS — L89154 Pressure ulcer of sacral region, stage 4: Secondary | ICD-10-CM

## 2017-02-24 LAB — BASIC METABOLIC PANEL
Anion gap: 7 (ref 5–15)
BUN: 12 mg/dL (ref 6–20)
CO2: 23 mmol/L (ref 22–32)
Calcium: 7.4 mg/dL — ABNORMAL LOW (ref 8.9–10.3)
Chloride: 107 mmol/L (ref 101–111)
Creatinine, Ser: 0.84 mg/dL (ref 0.61–1.24)
GFR calc Af Amer: >60 mL/min (ref >60)
Glucose, Bld: 112 mg/dL — ABNORMAL HIGH (ref 65–99)
POTASSIUM: 3.8 mmol/L (ref 3.5–5.1)
SODIUM: 137 mmol/L (ref 135–145)

## 2017-02-24 LAB — BPAM RBC
BLOOD PRODUCT EXPIRATION DATE: 201805252359
BLOOD PRODUCT EXPIRATION DATE: 201805252359
Blood Product Expiration Date: 201805252359
Blood Product Expiration Date: 201805252359
ISSUE DATE / TIME: 201804251717
ISSUE DATE / TIME: 201804252321
ISSUE DATE / TIME: 201804260301
UNIT TYPE AND RH: 5100
UNIT TYPE AND RH: 5100
UNIT TYPE AND RH: 5100
Unit Type and Rh: 5100

## 2017-02-24 LAB — TYPE AND SCREEN
ABO/RH(D): O POS
Antibody Screen: NEGATIVE
UNIT DIVISION: 0
UNIT DIVISION: 0
Unit division: 0
Unit division: 0

## 2017-02-24 MED ORDER — NON FORMULARY: Freq: Every day | Status: DC

## 2017-02-24 MED ORDER — DAKINS (1/4 STRENGTH) 0.125 % EX SOLN
Freq: Every day | CUTANEOUS | Status: AC
  Administered 2017-02-25 – 2017-02-26 (×2)
  Filled 2017-02-24: qty 473

## 2017-02-24 NOTE — Progress Notes (Signed)
PROGRESS NOTE  Andre Ball RUE:[id number] DOB: 12/15/1940 DOA: 02/20/2017 PCP: Maggie Font, MD  Brief History: 76 y.o.malewith medical history of dementia, BPH, hypertension, hyperlipidemia presenting from the nursing home secondary to fatigue. Unfortunately, the patient has a history of dementia, and he is unable to provide any history. All of this history is obtained from review of the medical record in speaking with the patient's wife. The patient was recently discharged from the hospital after a stay from 01/31/2017 through 02/04/2017 during which time the patient was treated for rhabdomyolysis secondary to frequent falls as well as his sacral and foot decubitus. The patient was discharged in stable condition to skilled nursing facility. His hemoglobin was 11.6 at the time of discharge. The patient was noted to have increasing fatigue and lethargy. Routine blood work was done and showed a hemoglobin in the 5 range. As a result, the patient was brought to the emergency department for further evaluation. There's been no recent hematemesis, hematuria, hematochezia, melena per his wife. The patient has not been started on any NSAIDs recently. There's been no fevers, chills, chest pain, abdominal pain.  Assessment/Plan: Acute Blood Loss Anemia/Heme Positive Stool/Symptomatic Anemia -Concerned about GI bleed -question NSAID induced--pt on DayPro as noted on MAR -appreciate Dr. 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 -This ulcer was present prior to his last hospitalization -bone palpable on exam with necrotic tissue-->underlying osteomyelitis clinically -Continueempiric  vancomycin/ceftriaxone/metronidazole, D#5 of 42 antibiotics -02/21/17 MRI pelvis--tip of coccyx with abnormal marrow signal suggesting osteomyelitis, small left hip effusion -ESR--60, CRP 19.3 -appreciate general surgery--bedside debridement on 4/27 -spouse seems "shocked"; under impression this happened "so quickly" -afebrile in past 48 hours -blood cultures x 2 sets--neg to date -continue daily hydrotherapy-->ok for dakins x 3 days  Acute metabolic encephalopathy -Secondary to blood loss anemia and infectious process -Spouse appears to be surprised this happened "so quickly" -improving with correction underlying medical issues  Right heel decubitus -Not infected on exam -Appreciatewound care nurse  Hypertension -Holding lisinopril -BP acceptable  Hypernatremia -improved with hypotonic IVF -Continue1/2NS  Dementia -continue aricept and namenda  Hyperglycemia -A1C--5.2 -likely stress induced hyperglycemia  BPH -continue tamulosin and finasteride  Goals of Care -pt is DNR -appreciatepalliative medicine -son appears to have better understanding of severity of clinical situation and potential poor outcomes    Disposition Plan: SNFin 1-2  days  Family Communication: No family at bedside  Consultants: [facility]; general surgery; palliative medicine  Code Status: FULL   DVT Prophylaxis: SCDs   Procedures: As Listed in Progress Note Above  Antibiotics: Vancomycin 4/25>>> Cefepime 4/25>>>4/27 Ceftriaxone 4/27>> Metronidazole 4/27>>>    Subjective: Patient is pleasantly confused.  No reports of vomiting, diarrhea, uncontrolled pain.  He denies cp, sob, abd pain.  Remainder ROS unobtainable due to dementia  Objective: Vitals:   02/23/17 0409 02/23/17 1300 02/23/17 2008 02/24/17 0448  BP: 109/61 (!) 121/58 (!) 128/59 (!) 143/64  Pulse: (!) 54 60 81 74  Resp: '16 18 14 14  ' Temp: 98.8 F (37.1 C) 98.4 F (36.9 C) 97.6 F (36.4 C)  98.5 F (36.9 C)  TempSrc: Oral Oral Oral Oral  SpO2: 99% 98% 98% 98%  Weight:      Height:  Intake/Output Summary (Last 24 hours) at 02/24/17 1234 Last data filed at 02/24/17 7989  Gross per 24 hour  Intake          2613.75 ml  Output             1350 ml  Net          1263.75 ml   Weight change:  Exam:   General:  Pt is alert,  not in acute distress  HEENT: No icterus, No thrush, No neck mass, [place]/AT  Cardiovascular: RRR, S1/S2, no rubs, no gallops  Respiratory: CTA bilaterally, no wheezing, no crackles, no rhonchi  Abdomen: Soft/+BS, non tender, non distended, no guarding  Extremities: No edema, No lymphangitis, No petechiae, No rashes, no synovitis   Data Reviewed: I have personally reviewed following labs and imaging studies Basic Metabolic Panel:  Recent Labs Lab 02/20/17 1535 02/21/17 0834 02/22/17 0451 02/23/17 0427 02/24/17 0418  NA 145 144 142 138 137  K 3.7 3.5 3.8 3.8 3.8  CL 110 110 110 106 107  CO2 '24 24 24 23 23  ' GLUCOSE 154* 95 98 102* 112*  BUN 27* 23* '19 14 12  ' CREATININE 1.07 0.91 0.91 0.90 0.84  CALCIUM 8.5* 8.0* 7.6* 7.6* 7.4*  MG  --   --   --  1.9  --    Liver Function Tests:  Recent Labs Lab 02/20/17 1535  AST 53*  ALT 33  ALKPHOS 62  BILITOT 0.2*  PROT 6.7  ALBUMIN 2.7*   No results for input(s): LIPASE, AMYLASE in the last 168 hours. No results for input(s): AMMONIA in the last 168 hours. Coagulation Profile:  Recent Labs Lab 02/20/17 1535  INR 1.11   CBC:  Recent Labs Lab 02/20/17 1535 02/21/17 0834 02/22/17 0451 02/23/17 0427  WBC 10.9* 10.3 14.3* 13.2*  NEUTROABS 9.7*  --   --   --   HGB 5.8* 8.5* 8.6* 8.4*  HCT 17.2* 25.1* 25.3* 25.4*  MCV 95.6 91.6 92.0 93.4  PLT 324 264 251 294   Cardiac Enzymes: No results for input(s): CKTOTAL, CKMB, CKMBINDEX, TROPONINI in the last 168 hours. BNP: Invalid input(s): POCBNP CBG: No results for input(s): GLUCAP in the last 168 hours. HbA1C: No results  for input(s): HGBA1C in the last 72 hours. Urine analysis:    Component Value Date/Time   COLORURINE YELLOW 02/21/2017 0125   APPEARANCEUR HAZY (A) 02/21/2017 0125   LABSPEC 1.024 02/21/2017 0125   PHURINE 5.0 02/21/2017 0125   GLUCOSEU NEGATIVE 02/21/2017 0125   HGBUR NEGATIVE 02/21/2017 0125   BILIRUBINUR NEGATIVE 02/21/2017 0125   KETONESUR NEGATIVE 02/21/2017 0125   PROTEINUR NEGATIVE 02/21/2017 0125   NITRITE NEGATIVE 02/21/2017 0125   LEUKOCYTESUR NEGATIVE 02/21/2017 0125   Sepsis Labs: '@LABRCNTIP' (procalcitonin:4,lacticidven:4) ) Recent Results (from the past 240 hour(s))  MRSA PCR Screening     Status: None   Collection Time: 02/21/17  6:32 AM  Result Value Ref Range Status   MRSA by PCR NEGATIVE NEGATIVE Final    Comment:        The GeneXpert MRSA Assay (FDA approved for NASAL specimens only), is one component of a comprehensive MRSA colonization surveillance program. It is not intended to diagnose MRSA infection nor to guide or monitor treatment for MRSA infections.   Culture, blood (Routine X 2) w Reflex to ID Panel     Status: None (Preliminary result)   Collection Time: 02/22/17  2:56 PM  Result Value Ref Range Status   Specimen Description BLOOD  LEFT ANTECUBITAL  Final   Special Requests IN PEDIATRIC BOTTLE Blood Culture adequate volume  Final   Culture   Final    NO GROWTH < 24 HOURS Performed at [facility], [address], [place], [place] [address]    Report Status PENDING  Incomplete  Culture, blood (Routine X 2) w Reflex to ID Panel     Status: None (Preliminary result)   Collection Time: 02/22/17  2:59 PM  Result Value Ref Range Status   Specimen Description BLOOD RIGHT HAND  Final   Special Requests IN PEDIATRIC BOTTLE Blood Culture adequate volume  Final   Culture   Final    NO GROWTH < 24 HOURS Performed at [facility], [place] [address], [place],  [address]    Report Status PENDING  Incomplete     Scheduled Meds: .  collagenase   Topical Daily  . donepezil  10 mg Oral QHS  . feeding supplement (ENSURE ENLIVE)  237 mL Oral BID BM  . finasteride  5 mg Oral Daily  . fluticasone  2 spray Each Nare Daily  . memantine  10 mg Oral BID  . montelukast  10 mg Oral QHS  . multivitamin with minerals  1 tablet Oral Daily  . omega-3 acid ethyl esters  1 g Oral Daily  . pantoprazole sodium  40 mg Oral BID  . protein supplement  2 oz Oral QID  . risperiDONE  1 mg Oral BID  . sertraline  25 mg Oral Daily  . tamsulosin  0.4 mg Oral Daily  . vitamin B-12  500 mcg Oral Daily   Continuous Infusions: . 0.45 % NaCl with KCl 20 mEq / L 75 mL/hr at 02/23/17 2125  . cefTRIAXone (ROCEPHIN)  IV Stopped (02/23/17 1928)  . metronidazole 500 mg (02/24/17 0924)  . vancomycin 1,000 mg (02/24/17 1006)    Procedures/Studies: Dg Chest 2 View  Result Date: 02/20/2017 CLINICAL DATA:  Fatigue.  Dementia. EXAM: CHEST  2 VIEW COMPARISON:  One-view chest x-ray 01/31/2017. Two-view chest x-ray 01/16/2017. FINDINGS: The heart is mildly enlarged. Lung volumes are low. There is no edema. There is some blunting of the CP angles posteriorly on both sides. Degenerative changes are again seen in the thoracic spine. IMPRESSION: 1. Blunting of posterior CP angle bilaterally appears chronic. No definite effusions. 2. Otherwise negative two view chest x-ray. Electronically Signed   By: San Morelle M.D.   On: 02/20/2017 15:24   Dg Pelvis 1-2 Views  Result Date: 01/31/2017 CLINICAL DATA:  Fall with buttock pain EXAM: PELVIS - 1-2 VIEW COMPARISON:  None. FINDINGS: SI joints are symmetric bilaterally. The pubic symphysis appears intact. Both femoral heads appear normally positioned. IMPRESSION: No definite acute osseous abnormality Electronically Signed   By: Donavan Foil M.D.   On: 01/31/2017 23:42   Ct Head Wo Contrast  Result Date: 01/31/2017 CLINICAL DATA:  [place] last night.  Ecchymosis. EXAM: CT HEAD WITHOUT CONTRAST CT CERVICAL SPINE  WITHOUT CONTRAST TECHNIQUE: Multidetector CT imaging of the head and cervical spine was performed following the standard protocol without intravenous contrast. Multiplanar CT image reconstructions of the cervical spine were also generated. COMPARISON:  01/16/2017 FINDINGS: CT HEAD FINDINGS Brain: There is no intracranial hemorrhage, mass or evidence of acute infarction. There is moderate generalized atrophy. There is moderate chronic microvascular ischemic change. There is no significant extra-axial fluid collection. No acute intracranial findings are evident. Vascular: No hyperdense vessel or unexpected calcification. Skull: Normal. Negative for  fracture or focal lesion. Sinuses/Orbits: No acute finding. Other: None. CT CERVICAL SPINE FINDINGS Alignment: Normal. Skull base and vertebrae: No acute fracture. No primary bone lesion or focal pathologic process. Soft tissues and spinal canal: No prevertebral fluid or swelling. No visible canal hematoma. Disc levels: Moderate cervical degenerative disc changes at C5-6. Facet articulations are arthritic but intact. Upper chest: Negative. Other: None IMPRESSION: 1. No acute intracranial findings. There is moderate generalized atrophy and chronic appearing white matter hypodensities which likely represent small vessel ischemic disease. 2. Negative for acute cervical spine fracture. Electronically Signed   By: Andreas Newport M.D.   On: 01/31/2017 23:44   Ct Cervical Spine Wo Contrast  Result Date: 01/31/2017 CLINICAL DATA:  [place] last night.  Ecchymosis. EXAM: CT HEAD WITHOUT CONTRAST CT CERVICAL SPINE WITHOUT CONTRAST TECHNIQUE: Multidetector CT imaging of the head and cervical spine was performed following the standard protocol without intravenous contrast. Multiplanar CT image reconstructions of the cervical spine were also generated. COMPARISON:  01/16/2017 FINDINGS: CT HEAD FINDINGS Brain: There is no intracranial hemorrhage, mass or evidence of acute infarction.  There is moderate generalized atrophy. There is moderate chronic microvascular ischemic change. There is no significant extra-axial fluid collection. No acute intracranial findings are evident. Vascular: No hyperdense vessel or unexpected calcification. Skull: Normal. Negative for fracture or focal lesion. Sinuses/Orbits: No acute finding. Other: None. CT CERVICAL SPINE FINDINGS Alignment: Normal. Skull base and vertebrae: No acute fracture. No primary bone lesion or focal pathologic process. Soft tissues and spinal canal: No prevertebral fluid or swelling. No visible canal hematoma. Disc levels: Moderate cervical degenerative disc changes at C5-6. Facet articulations are arthritic but intact. Upper chest: Negative. Other: None IMPRESSION: 1. No acute intracranial findings. There is moderate generalized atrophy and chronic appearing white matter hypodensities which likely represent small vessel ischemic disease. 2. Negative for acute cervical spine fracture. Electronically Signed   By: Andreas Newport M.D.   On: 01/31/2017 23:44   Mr Pelvis Wo Contrast  Result Date: 02/21/2017 CLINICAL DATA:  Sacral decubitus ulcer stage IV. Evaluate for possible osteomyelitis. EXAM: MRI PELVIS WITHOUT CONTRAST TECHNIQUE: Multiplanar multisequence MR imaging of the pelvis was performed. No intravenous contrast was administered. COMPARISON:  None. FINDINGS: Urinary Tract:  The urinary bladder is unremarkable. Bowel:  Unremarkable visualized pelvic bowel loops. Vascular/Lymphatic: No pathologically enlarged lymph nodes. No significant vascular abnormality seen. Reproductive:  No mass or other significant abnormality Other:  Diffuse anasarca about the pelvis and hips. Musculoskeletal: Subtle marrow signal abnormality involving the tip of the coccyx adjacent to a sacral decubitus ulcer raise concern for osteomyelitis, series 7, image 28 and series 6, image 5. Small joint effusion of the left hip. Osteoarthritic joint space  narrowing of both hips. No acute fracture of the bony pelvis. The visualized lower lumbar spine demonstrates lumbar spondylosis with multilevel disc space narrowing with osteophytes. IMPRESSION: 1. Diffuse soft tissue edema consistent with anasarca. 2. A sacral decubitus ulcer is noted with marrow signal abnormality involving the tip of the coccyx raising concern for osteomyelitis of the distal coccygeal segment. 3. Lumbar spondylosis. 4. Small left hip joint effusion.  No acute osseous abnormality. Electronically Signed   By: Ashley Royalty M.D.   On: 02/21/2017 18:15   US Abdomen Complete  Result Date: 02/01/2017 CLINICAL DATA:  Abnormal LFTs EXAM: ABDOMEN ULTRASOUND COMPLETE COMPARISON:  Ultrasound 01/08/2005 FINDINGS: Gallbladder: No gallstones or wall thickening visualized. No sonographic Murphy sign noted by sonographer. Common bile duct: Diameter: 4 mm in diameter within  normal limits Liver: No focal lesion identified. Within normal limits in parenchymal echogenicity. IVC: No abnormality visualized. Pancreas: Visualized portion unremarkable. Spleen: Size and appearance within normal limits. Measures 4 cm length Right Kidney: Length: 10.4 cm. Echogenicity within normal limits. No mass or hydronephrosis visualized. Left Kidney: Length: 10.4 cm. Echogenicity within normal limits. No hydronephrosis. There is a left renal cyst measures 9 x 9 mm. Abdominal aorta: No aneurysm visualized. Measures up to 2.2 cm in diameter Other findings: None. IMPRESSION: 1. No gallstones are noted within gallbladder.  Normal CBD. 2. No focal hepatic mass.  Normal liver echogenicity. 3. No hydronephrosis. No renal calculi. Left renal cyst measures 9 mm. 4. No aortic aneurysm. Electronically Signed   By: Lahoma Crocker M.D.   On: 02/01/2017 10:40   Dg Chest Port 1 View  Result Date: 01/31/2017 CLINICAL DATA:  Possible fall last evening.  Altered mental status. EXAM: PORTABLE CHEST 1 VIEW COMPARISON:  01/16/2017 CXR FINDINGS: The heart  size and mediastinal contours are within normal limits. Atherosclerosis at the aortic arch without aneurysm. Both lungs are clear. The visualized skeletal structures are unremarkable. IMPRESSION: No active disease. Electronically Signed   By: Ashley Royalty M.D.   On: 01/31/2017 20:41    Braydan Marriott, DO  [facility] Pager [phone]  If 7PM-7AM, please contact night-coverage [email or web address] Password [facility] 02/24/2017, 12:34 PM   LOS: 4 days

## 2017-02-24 NOTE — Progress Notes (Signed)
NO CHARGE NOTE  Palliative care  Chart reviewed.  Patient seen and briefly examined.  No family at bedside.  Plan is for return to SNF when medically ready.  I had discussed completion of MOST form prior to discharge with family.  They are to call to set up time if they would like to complete prior to discharge.  Will check in again tomorrow.  Please call if there are specific areas in which I may be of assistance in the care of [name].  Romie Minus, MD [facility] Palliative Medicine Team [phone]

## 2017-02-24 NOTE — Progress Notes (Signed)
Palliative care Progress Note  Reason for visit: Goals of care in light of advanced dementia with continued decline and complication including infected sacral decubitus ulcer  I met with patient's wife and son. We reviewed a MOST form and discussed how to develop plan of care to focus on continuing therapies that would maximize chance of being well enough to enjoy time outside of the hospital and limiting therapies not in line with this goal.  We completed MOST form today. DNR, Limited additional interventions, IVF and ABX if indicated, time limited trial of feeding tube.  Wife is in agreement that a good plan would be to plan to transition to SNF for rehab as they have previously been arranging. He has done well with rehabbing in the past. If he does well and continues to thrive, I encouraged they continue with this plan. If, however he is unable to regain function and he continues to decline, I recommended that she speak with his PCP to determine if he may be better served by focusing his care on staying at home with support of organization such as hospice.  I also discussed palliative care following at SNF to help guide this decision maklng and she is in agreement that this would be beneficial.  Please include order for outpatient palliative care to follow at SNF if agreed that this is appropriate.  Total time: 40 minutes Greater than 50%  of this time was spent counseling and coordinating care related to the above assessment and plan.  Micheline Rough, MD [place] Team [phone]

## 2017-02-25 LAB — CBC
HCT: 29.4 % — ABNORMAL LOW (ref 39.0–52.0)
HEMOGLOBIN: 9.8 g/dL — AB (ref 13.0–17.0)
MCH: 31 pg (ref 26.0–34.0)
MCHC: 33.3 g/dL (ref 30.0–36.0)
MCV: 93 fL (ref 78.0–100.0)
PLATELETS: 428 10*3/uL — AB (ref 150–400)
RBC: 3.16 MIL/uL — ABNORMAL LOW (ref 4.22–5.81)
RDW: 14.4 % (ref 11.5–15.5)
WBC: 12.9 10*3/uL — AB (ref 4.0–10.5)

## 2017-02-25 NOTE — Progress Notes (Signed)
Assessment Active Problems:   Sacral decubitus ulcer, stage IV (HCC)   Osteomyelitis of vertebra, sacral and sacrococcygeal region [facility])   Plan:  Start hydrotherapy.  Check wound later this week.   LOS: 5 days        Chief Complaint/Subjective: He states his is having a little pain at wound site.  Objective: Vital signs in last 24 hours: Temp:  [97.9 F (36.6 C)-99.3 F (37.4 C)] 99.3 F (37.4 C) (04/30 0500) Pulse Rate:  [72-78] 77 (04/30 0500) Resp:  [16-18] 16 (04/30 0500) BP: (120-154)/(53-78) 154/53 (04/30 0500) SpO2:  [96 %-99 %] 97 % (04/30 0500) Last BM Date: 02/24/17  Intake/Output from previous day: 04/29 0701 - 04/30 0700 In: 770 [P.O.:220; I.V.:200; IV Piggyback:350] Out: 500 [Urine:500] Intake/Output this shift: No intake/output data recorded.  PE: General- In NAD.  Awake and alert. Skin-sacral wound with exposed bone and 15-20 necrotic tissue at base right lower side.  Lab Results:   Recent Labs  02/23/17 0427 02/25/17 0404  WBC 13.2* 12.9*  HGB 8.4* 9.8*  HCT 25.4* 29.4*  PLT 294 428*   BMET  Recent Labs  02/23/17 0427 02/24/17 0418  NA 138 137  K 3.8 3.8  CL 106 107  CO2 23 23  GLUCOSE 102* 112*  BUN 14 12  CREATININE 0.90 0.84  CALCIUM 7.6* 7.4*   PT/INR No results for input(s): LABPROT, INR in the last 72 hours. Comprehensive Metabolic Panel:    Component Value Date/Time   NA 137 02/24/2017 0418   NA 138 02/23/2017 0427   K 3.8 02/24/2017 0418   K 3.8 02/23/2017 0427   CL 107 02/24/2017 0418   CL 106 02/23/2017 0427   CO2 23 02/24/2017 0418   CO2 23 02/23/2017 0427   BUN 12 02/24/2017 0418   BUN 14 02/23/2017 0427   CREATININE 0.84 02/24/2017 0418   CREATININE 0.90 02/23/2017 0427   GLUCOSE 112 (H) 02/24/2017 0418   GLUCOSE 102 (H) 02/23/2017 0427   CALCIUM 7.4 (L) 02/24/2017 0418   CALCIUM 7.6 (L) 02/23/2017 0427   AST 53 (H) 02/20/2017 1535   AST 145 (H) 01/31/2017 1958   ALT 33 02/20/2017 1535   ALT 74 (H)  01/31/2017 1958   ALKPHOS 62 02/20/2017 1535   ALKPHOS 56 01/31/2017 1958   BILITOT 0.2 (L) 02/20/2017 1535   BILITOT 0.8 01/31/2017 1958   PROT 6.7 02/20/2017 1535   PROT 6.5 01/31/2017 1958   ALBUMIN 2.7 (L) 02/20/2017 1535   ALBUMIN 2.9 (L) 01/31/2017 1958     Studies/Results: No results found.  Anti-infectives: Anti-infectives    Start     Dose/Rate Route Frequency Ordered Stop   02/22/17 1800  cefTRIAXone (ROCEPHIN) 2 g in dextrose 5 % 50 mL IVPB     2 g 100 mL/hr over 30 Minutes Intravenous Every 24 hours 02/22/17 1750     02/22/17 1800  metroNIDAZOLE (FLAGYL) IVPB 500 mg     500 mg 100 mL/hr over 60 Minutes Intravenous Every 8 hours 02/22/17 1750     02/21/17 0900  vancomycin (VANCOCIN) IVPB 1000 mg/200 mL premix     1,000 mg 200 mL/hr over 60 Minutes Intravenous Every 12 hours 02/20/17 1928     02/21/17 0400  ceFEPIme (MAXIPIME) 1 g in dextrose 5 % 50 mL IVPB  Status:  Discontinued     1 g 100 mL/hr over 30 Minutes Intravenous Every 8 hours 02/20/17 1927 02/22/17 1750   02/20/17 2100  vancomycin (VANCOCIN) 1,500  mg in sodium chloride 0.9 % 500 mL IVPB     1,500 mg 250 mL/hr over 120 Minutes Intravenous  Once 02/20/17 1928 02/21/17 0058   02/20/17 2000  ceFEPIme (MAXIPIME) 2 g in dextrose 5 % 50 mL IVPB     2 g 100 mL/hr over 30 Minutes Intravenous  Once 02/20/17 1927 02/20/17 2226       Lott Seelbach J 02/25/2017

## 2017-02-25 NOTE — Progress Notes (Signed)
HYDROTHERAPY TREATMENT      02/25/17 1300  Subjective Assessment  Patient and Family Stated Goals pt unable-no family present  Prior Treatments unknown  Evaluation and Treatment  Evaluation and Treatment Procedures Explained to Patient/Family Yes  Evaluation and Treatment Procedures Patient unable to consent due to mental status  Pressure Injury 02/22/17 Unstageable - Full thickness tissue loss in which the base of the ulcer is covered by slough (yellow, tan, gray, green or brown) and/or eschar (tan, brown or black) in the wound bed.  Date First Assessed/Time First Assessed: 02/22/17 1345   Location: Sacrum  Staging: Unstageable - Full thickness tissue loss in which the base of the ulcer is covered by slough (yellow, tan, gray, green or brown) and/or eschar (tan, brown or black) in...  Dressing Type ABD;Gauze;Dakin's soaked Kerlix  Dressing Change Frequency Daily  % Wound base Red or Granulating 25%  % Wound base Yellow/Fibrinous Exudate 25%  % Wound base Black/Eschar 50%  Drainage Amount Moderate  Drainage Description Odor;Serosanguineous  Treatment Debridement (Selective);Hydrotherapy (Pulse lavage);Packing (Saline gauze)  Hydrotherapy  Pulsed lavage therapy - wound location sacrum  Pulsed Lavage with Suction (psi) 8 psi  Pulsed Lavage with Suction - Normal Saline Used 1000 mL  Pulsed Lavage Tip Tip with splash shield  Selective Debridement  Selective Debridement - Location sacrum  Selective Debridement - Tools Used Forceps;Scissors  Selective Debridement - Tissue Removed black necrotic tissue  Wound Therapy - Assess/Plan/Recommendations  Wound Therapy - Clinical Statement 76 yo male admitted with anemia. Hx of dementia. Mostly bedbound. Bedside debridement by surgery 02/22/17  Wound Therapy - Functional Problem List bedbound  Factors Delaying/Impairing Wound Healing Infection - systemic/local;Incontinence;Immobility  Hydrotherapy Plan Debridement;Pulsatile lavage with  suction;Patient/family education;Dressing change  Wound Therapy - Frequency 6X / week  Wound Therapy - Current Recommendations Case manager/social work  Wound Therapy - Follow Up Recommendations Skilled nursing facility  Wound Plan Hydrotherapy and enzymatic debridement to facilitate wound healing.  Very foul odor and purulent drainage noted on today. Spoke with WOC Nurse who entered order for Dakin's to start today.   Wound Therapy Goals - Improve the function of patient's integumentary system by progressing the wound(s) through the phases of wound healing by:  Decrease Necrotic Tissue to 50  Decrease Necrotic Tissue - Progress Progressing toward goal  Increase Granulation Tissue to 50  Increase Granulation Tissue - Progress Progressing toward goal  Goals/treatment plan/discharge plan were made with and agreed upon by patient/family No, Patient unable to participate in goals/treatment/discharge plan and family unavailable  Time For Goal Achievement 2 weeks  Wound Therapy - Potential for Goals Bolivar Haw, MPT [phone]

## 2017-02-25 NOTE — Consult Note (Signed)
WOC re-consulted for request for Dakin's solution.  PT Andre Ball contacted WOC nurse this am for same.  After review of the record, 1/4" Dakin's has been ordered and wound care orders have been updated.  I have DCed the order for enzymatic debridement ointment for now.  Hogan Hoobler [facility], CNS [phone]

## 2017-02-25 NOTE — Progress Notes (Signed)
CSW following for disposition/DC planning. Pt from [facility] SNF and hopes to return at DC.   Spoke with Clydie Braun at [facility], who is aware.  Spoke with pt's wife Britta Mccreedy, reiterates her desire for pt to return to [facility] at DC when stable. States she is in process of applying for MCD for patient as she anticipates LTC will be needed for him eventually.   Britta Mccreedy and Clydie Braun were updated that pt receiving hydrotherapy, a service [facility] cannot provide, and if hydrotherapy still needed at DC, new facility search will be needed. Both understanding.  CSW will continue following for DC planning.   Ilean Skill, MSW, LCSW Clinical Social Work 02/25/2017 [phone]

## 2017-02-25 NOTE — Care Management Important Message (Signed)
Important Message  Patient Details  Name: MAN EFFERTZ MRN: [id number] Date of Birth: [birth year]   Medicare Important Message Given:  Yes    Caren Macadam 02/25/2017, 11:42 AMImportant Message  Patient Details  Name: JOMES GIRALDO MRN: [id number] Date of Birth: 1941-05-31   Medicare Important Message Given:  Yes    Caren Macadam 02/25/2017, 11:41 AM

## 2017-02-25 NOTE — Progress Notes (Addendum)
Pharmacy Antibiotic Follow-up Note  Andre Ball is a 76 y.o. year-old male admitted on 02/20/2017.  The patient is currently on vancomycin per pharmacy (and ceftriaxone, metronidazole) for osteomyelitis.  Today, 02/25/2017  Day #5 antibiotics  Renal: SCr WNL  WBC elevated, trending down  Afebrile  Vancomycin trough within goal 4/28  4/26 MRI: A sacral decubitus ulcer is noted with marrow signal abnormality involving the tip of the coccyx raising concern for osteomyelitis of the distal coccygeal segment.  4/26 Bedside debridement  CRP elevated at 19.3, Sed rate inc at 60  Assessment/Plan:  Continue vancomycin 1gm IV q12h (goal trough 15-20 mcg/mL for osteomyelitis).   Monitor renal function  Ceftriaxone and metronidazole per [facility]  Temp (24hrs), Avg:98.6 F (37 C), Min:97.9 F (36.6 C), Max:99.3 F (37.4 C)   Recent Labs Lab 02/20/17 1535 02/21/17 0834 02/22/17 0451 02/23/17 0427 02/25/17 0404  WBC 10.9* 10.3 14.3* 13.2* 12.9*     Recent Labs Lab 02/20/17 1535 02/21/17 0834 02/22/17 0451 02/23/17 0427 02/24/17 0418  CREATININE 1.07 0.91 0.91 0.90 0.84   Estimated Creatinine Clearance: 79.7 mL/min (by C-G formula based on SCr of 0.84 mg/dL).    No Known Allergies  Antimicrobials this admission: 4/25 Cefepime >>4/27 4/25 Vancomycin >> 4/27 Rocephin >> 4/27 Flagyl >>  Levels/dose changes this admission: 4/28 0930 VT = 18 mcg/mL on 1gm q12h (prior to 6th overall dose)  Microbiology results: 4/26 MRSA PCR: neg No cultures  Thank you for allowing pharmacy to be a part of this patient's care.  Juliette Alcide, PharmD, BCPS.   Pager: [phone] 02/25/2017 9:23 AM

## 2017-02-25 NOTE — Progress Notes (Signed)
PROGRESS NOTE  Andre Ball WGY:[id number] DOB: 06/06/1941 DOA: 02/20/2017 PCP: Maggie Font, MD  Brief History: 76 y.o.malewith medical history of dementia, BPH, hypertension, hyperlipidemia presenting from the nursing home secondary to fatigue. Unfortunately, the patient has a history of dementia, and he is unable to provide any history. All of this history is obtained from review of the medical record in speaking with the patient's wife. The patient was recently discharged from the hospital after a stay from 01/31/2017 through 02/04/2017 during which time the patient was treated for rhabdomyolysis secondary to frequent falls as well as his sacral and foot decubitus. The patient was discharged in stable condition to skilled nursing facility. His hemoglobin was 11.6 at the time of discharge. The patient was noted to have increasing fatigue and lethargy. Routine blood work was done and showed a hemoglobin in the 5 range. As a result, the patient was brought to the emergency department for further evaluation. There's been no recent hematemesis, hematuria, hematochezia, melena per his wife. The patient has not been started on any NSAIDs recently. There's been no fevers, chills, chest pain, abdominal pain.  Assessment/Plan: Acute Blood Loss Anemia/Heme Positive Stool/Symptomatic Anemia -Concerned about GI bleed -question NSAID induced--pt on DayPro as noted on MAR -appreciate Dr. 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 -This ulcer was present prior to his last hospitalization -bone palpable on exam with necrotic tissue-->underlying osteomyelitis clinically -Continueempiric  vancomycin/ceftriaxone/metronidazole, D#6 of 42 antibiotics -02/21/17 MRI pelvis--tip of coccyx with abnormal marrow signal suggesting osteomyelitis, small left hip effusion -ESR--60, CRP 19.3 -appreciate general surgery--bedside debridement on 4/27 -spouse seems "shocked"; under impression this happened "so quickly" -afebrile in past 72 hours -blood cultures x 2 sets--neg to date -continue daily hydrotherapy-->ok for dakins x 3 days--D#1 today -still with devitalized tissues and foul odor, still requires daily hydrotherapy -place PICC on 5/1 if blood cultures remain neg  Acute metabolic encephalopathy -Secondary to blood loss anemia and infectious process -Spouse appears to be surprised this happened "so quickly" -improving with correction underlying medical issues  Right heel decubitus -Not infected on exam -Appreciatewound care nurse  Hypertension -Holding lisinopril -BP acceptable  Hypernatremia -improved with hypotonic IVF -Continue1/2NS  Dementia -continue aricept and namenda  Hyperglycemia -A1C--5.2 -likely stress induced hyperglycemia  BPH -continue tamulosin and finasteride  Goals of Care -pt is DNR -appreciatepalliative medicine -son appears to have better understanding of severity of clinical situation and potential poor outcomes    Disposition Plan: SNF when sacrum improves with hydrotherapy and cleared by surgery Family Communication: No family at bedside  Consultants: [facility]; general surgery; palliative medicine  Code Status: FULL   DVT Prophylaxis: SCDs   Procedures: As Listed in Progress Note Above  Antibiotics: Vancomycin 4/25>>> Cefepime 4/25>>>4/27 Ceftriaxone 4/27>> Metronidazole 4/27>>>     Subjective: Patient denies fevers, chills, headache, chest pain, dyspnea, nausea, vomiting, diarrhea, abdominal pain, dysuria, hematuria, hematochezia, and melena.   Objective: Vitals:   02/24/17 1300  02/24/17 2209 02/25/17 0500 02/25/17 1316  BP: 136/61 120/78 (!) 154/53 136/67  Pulse: 78 72 77 70  Resp: _0 Temp: 97.9 F (36.6 C) 98.7 F (37.1 C) 99.3 F (37.4 C) 98.6 F (37 C)  TempSrc: Oral Oral Oral Axillary  SpO2: 99% 96% 97%   Weight:      Height:        Intake/Output Summary (Last 24 hours) at 02/25/17 1620 Last data filed at 02/25/17 0900  Gross per 24 hour  Intake              650 ml  Output              500 ml  Net              150 ml   Weight change:  Exam:   General:  Pt is alert, follows commands appropriately, not in acute distress  HEENT: No icterus, No thrush, No neck mass, [place]/AT  Cardiovascular: RRR, S1/S2, no rubs, no gallops  Respiratory: poor inspiratory effort, but CTA  Abdomen: Soft/+BS, non tender, non distended, no guarding  Extremities: No edema, No lymphangitis, No petechiae, No rashes, no synovitis   Data Reviewed: I have personally reviewed following labs and imaging studies Basic Metabolic Panel:  Recent Labs Lab 02/20/17 1535 02/21/17 0834 02/22/17 0451 02/23/17 0427 02/24/17 0418  NA 145 144 142 138 137  K 3.7 3.5 3.8 3.8 3.8  CL 110 110 110 106 107  CO2 _0 GLUCOSE 154* 95 98 102* 112*  BUN 27* 23* _1 CREATININE 1.07 0.91 0.91 0.90 0.84  CALCIUM 8.5* 8.0* 7.6* 7.6* 7.4*  MG  --   --   --  1.9  --    Liver Function Tests:  Recent Labs Lab 02/20/17 1535  AST 53*  ALT 33  ALKPHOS 62  BILITOT 0.2*  PROT 6.7  ALBUMIN 2.7*   No results for input(s): LIPASE, AMYLASE in the last 168 hours. No results for input(s): AMMONIA in the last 168 hours. Coagulation Profile:  Recent Labs Lab 02/20/17 1535  INR 1.11   CBC:  Recent Labs Lab 02/20/17 1535 02/21/17 0834 02/22/17 0451 02/23/17 0427 02/25/17 0404  WBC 10.9* 10.3 14.3* 13.2* 12.9*  NEUTROABS 9.7*  --   --   --   --   HGB 5.8* 8.5* 8.6* 8.4* 9.8*  HCT 17.2* 25.1* 25.3* 25.4* 29.4*  MCV 95.6 91.6 92.0 93.4 93.0  PLT 324  264 251 294 428*   Cardiac Enzymes: No results for input(s): CKTOTAL, CKMB, CKMBINDEX, TROPONINI in the last 168 hours. BNP: Invalid input(s): POCBNP CBG: No results for input(s): GLUCAP in the last 168 hours. HbA1C: No results for input(s): HGBA1C in the last 72 hours. Urine analysis:    Component Value Date/Time   COLORURINE YELLOW 02/21/2017 0125   APPEARANCEUR HAZY (A) 02/21/2017 0125   LABSPEC 1.024 02/21/2017 0125   PHURINE 5.0 02/21/2017 0125   GLUCOSEU NEGATIVE 02/21/2017 0125   HGBUR NEGATIVE 02/21/2017 0125   BILIRUBINUR NEGATIVE 02/21/2017 0125   KETONESUR NEGATIVE 02/21/2017 0125   PROTEINUR NEGATIVE 02/21/2017 0125   NITRITE NEGATIVE 02/21/2017 0125   LEUKOCYTESUR NEGATIVE 02/21/2017 0125   Sepsis Labs: _2 (procalcitonin:4,lacticidven:4) ) Recent Results (from the past 240 hour(s))  MRSA PCR Screening     Status: None   Collection Time: 02/21/17  6:32 AM  Result Value Ref Range Status   MRSA by PCR NEGATIVE NEGATIVE Final    Comment:        The GeneXpert MRSA Assay (FDA approved for NASAL specimens only), is one component of a comprehensive MRSA colonization surveillance program. It is not intended to diagnose MRSA infection nor to guide or monitor treatment for MRSA infections.  Culture, blood (Routine X 2) w Reflex to ID Panel     Status: None (Preliminary result)   Collection Time: 02/22/17  2:56 PM  Result Value Ref Range Status   Specimen Description BLOOD LEFT ANTECUBITAL  Final   Special Requests IN PEDIATRIC BOTTLE Blood Culture adequate volume  Final   Culture   Final    NO GROWTH 3 DAYS Performed at [facility], [place] [address] [place], [place] [address]    Report Status PENDING  Incomplete  Culture, blood (Routine X 2) w Reflex to ID Panel     Status: None (Preliminary result)   Collection Time: 02/22/17  2:59 PM  Result Value Ref Range Status   Specimen Description BLOOD RIGHT HAND  Final   Special Requests IN PEDIATRIC  BOTTLE Blood Culture adequate volume  Final   Culture   Final    NO GROWTH 3 DAYS Performed at [facility], [place] [address] [place], [place] [address]    Report Status PENDING  Incomplete     Scheduled Meds: . donepezil  10 mg Oral QHS  . feeding supplement (ENSURE ENLIVE)  237 mL Oral BID BM  . finasteride  5 mg Oral Daily  . fluticasone  2 spray Each Nare Daily  . memantine  10 mg Oral BID  . montelukast  10 mg Oral QHS  . multivitamin with minerals  1 tablet Oral Daily  . omega-3 acid ethyl esters  1 g Oral Daily  . pantoprazole sodium  40 mg Oral BID  . protein supplement  2 oz Oral QID  . risperiDONE  1 mg Oral BID  . sertraline  25 mg Oral Daily  . sodium hypochlorite   Irrigation Daily  . tamsulosin  0.4 mg Oral Daily  . vitamin B-12  500 mcg Oral Daily   Continuous Infusions: . 0.45 % NaCl with KCl 20 mEq / L 75 mL/hr at 02/23/17 2125  . cefTRIAXone (ROCEPHIN)  IV Stopped (02/24/17 1808)  . metronidazole Stopped (02/25/17 1310)  . vancomycin Stopped (02/25/17 1410)    Procedures/Studies: Dg Chest 2 View  Result Date: 02/20/2017 CLINICAL DATA:  Fatigue.  Dementia. EXAM: CHEST  2 VIEW COMPARISON:  One-view chest x-ray 01/31/2017. Two-view chest x-ray 01/16/2017. FINDINGS: The heart is mildly enlarged. Lung volumes are low. There is no edema. There is some blunting of the CP angles posteriorly on both sides. Degenerative changes are again seen in the thoracic spine. IMPRESSION: 1. Blunting of posterior CP angle bilaterally appears chronic. No definite effusions. 2. Otherwise negative two view chest x-ray. Electronically Signed   By: San Morelle M.D.   On: 02/20/2017 15:24   Dg Pelvis 1-2 Views  Result Date: 01/31/2017 CLINICAL DATA:  Fall with buttock pain EXAM: PELVIS - 1-2 VIEW COMPARISON:  None. FINDINGS: SI joints are symmetric bilaterally. The pubic symphysis appears intact. Both femoral heads appear normally positioned. IMPRESSION: No definite acute  osseous abnormality Electronically Signed   By: Donavan Foil M.D.   On: 01/31/2017 23:42   Ct Head Wo Contrast  Result Date: 01/31/2017 CLINICAL DATA:  [place] last night.  Ecchymosis. EXAM: CT HEAD WITHOUT CONTRAST CT CERVICAL SPINE WITHOUT CONTRAST TECHNIQUE: Multidetector CT imaging of the head and cervical spine was performed following the standard protocol without intravenous contrast. Multiplanar CT image reconstructions of the cervical spine were also generated. COMPARISON:  01/16/2017 FINDINGS: CT HEAD FINDINGS Brain: There is no intracranial hemorrhage, mass or evidence of acute infarction. There is  moderate generalized atrophy. There is moderate chronic microvascular ischemic change. There is no significant extra-axial fluid collection. No acute intracranial findings are evident. Vascular: No hyperdense vessel or unexpected calcification. Skull: Normal. Negative for fracture or focal lesion. Sinuses/Orbits: No acute finding. Other: None. CT CERVICAL SPINE FINDINGS Alignment: Normal. Skull base and vertebrae: No acute fracture. No primary bone lesion or focal pathologic process. Soft tissues and spinal canal: No prevertebral fluid or swelling. No visible canal hematoma. Disc levels: Moderate cervical degenerative disc changes at C5-6. Facet articulations are arthritic but intact. Upper chest: Negative. Other: None IMPRESSION: 1. No acute intracranial findings. There is moderate generalized atrophy and chronic appearing white matter hypodensities which likely represent small vessel ischemic disease. 2. Negative for acute cervical spine fracture. Electronically Signed   By: Andreas Newport M.D.   On: 01/31/2017 23:44   Ct Cervical Spine Wo Contrast  Result Date: 01/31/2017 CLINICAL DATA:  [place] last night.  Ecchymosis. EXAM: CT HEAD WITHOUT CONTRAST CT CERVICAL SPINE WITHOUT CONTRAST TECHNIQUE: Multidetector CT imaging of the head and cervical spine was performed following the standard protocol without  intravenous contrast. Multiplanar CT image reconstructions of the cervical spine were also generated. COMPARISON:  01/16/2017 FINDINGS: CT HEAD FINDINGS Brain: There is no intracranial hemorrhage, mass or evidence of acute infarction. There is moderate generalized atrophy. There is moderate chronic microvascular ischemic change. There is no significant extra-axial fluid collection. No acute intracranial findings are evident. Vascular: No hyperdense vessel or unexpected calcification. Skull: Normal. Negative for fracture or focal lesion. Sinuses/Orbits: No acute finding. Other: None. CT CERVICAL SPINE FINDINGS Alignment: Normal. Skull base and vertebrae: No acute fracture. No primary bone lesion or focal pathologic process. Soft tissues and spinal canal: No prevertebral fluid or swelling. No visible canal hematoma. Disc levels: Moderate cervical degenerative disc changes at C5-6. Facet articulations are arthritic but intact. Upper chest: Negative. Other: None IMPRESSION: 1. No acute intracranial findings. There is moderate generalized atrophy and chronic appearing white matter hypodensities which likely represent small vessel ischemic disease. 2. Negative for acute cervical spine fracture. Electronically Signed   By: Andreas Newport M.D.   On: 01/31/2017 23:44   Mr Pelvis Wo Contrast  Result Date: 02/21/2017 CLINICAL DATA:  Sacral decubitus ulcer stage IV. Evaluate for possible osteomyelitis. EXAM: MRI PELVIS WITHOUT CONTRAST TECHNIQUE: Multiplanar multisequence MR imaging of the pelvis was performed. No intravenous contrast was administered. COMPARISON:  None. FINDINGS: Urinary Tract:  The urinary bladder is unremarkable. Bowel:  Unremarkable visualized pelvic bowel loops. Vascular/Lymphatic: No pathologically enlarged lymph nodes. No significant vascular abnormality seen. Reproductive:  No mass or other significant abnormality Other:  Diffuse anasarca about the pelvis and hips. Musculoskeletal: Subtle marrow  signal abnormality involving the tip of the coccyx adjacent to a sacral decubitus ulcer raise concern for osteomyelitis, series 7, image 28 and series 6, image 5. Small joint effusion of the left hip. Osteoarthritic joint space narrowing of both hips. No acute fracture of the bony pelvis. The visualized lower lumbar spine demonstrates lumbar spondylosis with multilevel disc space narrowing with osteophytes. IMPRESSION: 1. Diffuse soft tissue edema consistent with anasarca. 2. A sacral decubitus ulcer is noted with marrow signal abnormality involving the tip of the coccyx raising concern for osteomyelitis of the distal coccygeal segment. 3. Lumbar spondylosis. 4. Small left hip joint effusion.  No acute osseous abnormality. Electronically Signed   By: Ashley Royalty M.D.   On: 02/21/2017 18:15   US Abdomen Complete  Result Date: 02/01/2017 CLINICAL DATA:  Abnormal LFTs EXAM: ABDOMEN ULTRASOUND COMPLETE COMPARISON:  Ultrasound 01/08/2005 FINDINGS: Gallbladder: No gallstones or wall thickening visualized. No sonographic Murphy sign noted by sonographer. Common bile duct: Diameter: 4 mm in diameter within normal limits Liver: No focal lesion identified. Within normal limits in parenchymal echogenicity. IVC: No abnormality visualized. Pancreas: Visualized portion unremarkable. Spleen: Size and appearance within normal limits. Measures 4 cm length Right Kidney: Length: 10.4 cm. Echogenicity within normal limits. No mass or hydronephrosis visualized. Left Kidney: Length: 10.4 cm. Echogenicity within normal limits. No hydronephrosis. There is a left renal cyst measures 9 x 9 mm. Abdominal aorta: No aneurysm visualized. Measures up to 2.2 cm in diameter Other findings: None. IMPRESSION: 1. No gallstones are noted within gallbladder.  Normal CBD. 2. No focal hepatic mass.  Normal liver echogenicity. 3. No hydronephrosis. No renal calculi. Left renal cyst measures 9 mm. 4. No aortic aneurysm. Electronically Signed   By: Lahoma Crocker M.D.   On: 02/01/2017 10:40   Dg Chest Port 1 View  Result Date: 01/31/2017 CLINICAL DATA:  Possible fall last evening.  Altered mental status. EXAM: PORTABLE CHEST 1 VIEW COMPARISON:  01/16/2017 CXR FINDINGS: The heart size and mediastinal contours are within normal limits. Atherosclerosis at the aortic arch without aneurysm. Both lungs are clear. The visualized skeletal structures are unremarkable. IMPRESSION: No active disease. Electronically Signed   By: Ashley Royalty M.D.   On: 01/31/2017 20:41    Lyrik Buresh, DO  [facility] Pager [phone]  If 7PM-7AM, please contact night-coverage [email or web address] Password [email or web address] 02/25/2017, 4:20 PM   LOS: 5 days

## 2017-02-26 LAB — CREATININE, SERUM
CREATININE: 0.8 mg/dL (ref 0.61–1.24)
GFR calc Af Amer: >60 mL/min (ref >60)
GFR calc non Af Amer: >60 mL/min (ref >60)

## 2017-02-26 MED ORDER — PANTOPRAZOLE SODIUM 40 MG PO TBEC
40.0000 mg | DELAYED_RELEASE_TABLET | Freq: Two times a day (BID) | ORAL | Status: DC
  Administered 2017-02-26 – 2017-02-27 (×3): 40 mg via ORAL
  Filled 2017-02-26 (×3): qty 1

## 2017-02-26 MED ORDER — HALOPERIDOL LACTATE 5 MG/ML IJ SOLN: 5.0000 mg | Freq: Once | INTRAMUSCULAR | Status: DC

## 2017-02-26 NOTE — Progress Notes (Signed)
PROGRESS NOTE  Andre Ball JAS:[id number] DOB: 12-Jan-1941 DOA: 02/20/2017 PCP: Maggie Font, MD  Brief History: 76 y.o.malewith medical history of dementia, BPH, hypertension, hyperlipidemia presenting from the nursing home secondary to fatigue. Unfortunately, the patient has a history of dementia, and he is unable to provide any history. All of this history is obtained from review of the medical record in speaking with the patient's wife. The patient was recently discharged from the hospital after a stay from 01/31/2017 through 02/04/2017 during which time the patient was treated for rhabdomyolysis secondary to frequent falls as well as his sacral and foot decubitus. The patient was discharged in stable condition to skilled nursing facility. His hemoglobin was 11.6 at the time of discharge. The patient was noted to have increasing fatigue and lethargy. Routine blood work was done and showed a hemoglobin in the 5 range. As a result, the patient was brought to the emergency department for further evaluation. There's been no recent hematemesis, hematuria, hematochezia, melena per his wife. The patient has not been started on any NSAIDs recently. There's been no fevers, chills, chest pain, abdominal pain.  During the initial evaluation, the patient was noted to have a 100% necrotic sacral wound with malodorous drainage. The bone was palpable suggesting osteomyelitis. 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 The patient antibiotics were adjusted to vancomycin, cefazolin, and metronidazole. Gen. surgery was consulted. They performed a bedside debridement and recommended hydrotherapy.  Assessment/Plan: Acute Blood Loss Anemia/Heme Positive Stool/Symptomatic Anemia -Concerned about GI bleed -question NSAID induced--pt on DayPro as noted on MAR -appreciate Dr. 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 -This ulcer was present prior to his last hospitalization -bone palpable on exam with necrotic tissue-->underlying osteomyelitis clinically -Continueempiric vancomycin/ceftriaxone/metronidazole, D#7 of 42antibiotics -02/21/17 MRI pelvis--tip of coccyx with abnormal marrow signal suggesting osteomyelitis, small left hip effusion -4/26--ESR--60, CRP 19.3 -appreciate general surgery--bedside debridement on 4/27 -afebrile in past 96hours -blood cultures x 2 sets--neg to date -continue daily hydrotherapy-->ok for dakins x 3 days--D#2 today -02/26/17--personally examined sacral wound-->~40% granulation, 60% devitalized tissue,slough -place PICC  -repeat CRP, ESR  Acute metabolic encephalopathy -Secondary to blood loss anemia and infectious process -Spouse appears to be surprised this happened "so quickly" -improving with correction underlying medical issues -now back to baseline  Right heel decubitus -Not infected on exam -Appreciatewound care nurse  Hypertension -Holding lisinopril -BP acceptable  Hypernatremia -improvedwith hypotonic IVF -Continue1/2NS  Dementia -continue aricept and namenda  Hyperglycemia -A1C--5.2 -likely stress induced hyperglycemia  BPH -continue tamulosin and finasteride  Goals of Care -pt is DNR -appreciatepalliative medicine -son appears to have better understanding of severity of clinical situation and potential poor outcomes    Disposition Plan: SNF when sacrum improves with hydrotherapy and cleared by surgery; Possible LTAC if continued need for hydrotherapy  Family Communication: No family at bedside  Consultants: [facility]; general surgery; palliative medicine  Code Status: FULL   DVT  Prophylaxis: SCDs   Procedures: As Listed in Progress Note Above  Antibiotics: Vancomycin 4/25>>> Cefepime 4/25>>>4/27 Ceftriaxone 4/27>> Metronidazole 4/27>>>    Subjective:  patient is awake and alert. He states that he feels hungry. Denies any fevers, chills, chest pain, shortness breath, nausea, vomiting, diarrhea, abdominal pain. He denies any  pain in his buttocks.   Objective: Vitals:   02/25/17 0500 02/25/17 1316 02/25/17 2028 02/26/17 0457  BP: (!) 154/53 136/67 (!) 149/69 (!) 158/58  Pulse: 77 70 65 78  Resp: '16 16 18 18  ' Temp: 99.3 F (37.4 C) 98.6 F (37 C) 98.5 F (36.9 C) 98.6 F (37 C)  TempSrc: Oral Axillary Oral Axillary  SpO2: 97%  98% 99%  Weight:      Height:        Intake/Output Summary (Last 24 hours) at 02/26/17 1344 Last data filed at 02/26/17 0951  Gross per 24 hour  Intake             1300 ml  Output             1575 ml  Net             -275 ml   Weight change:  Exam:   General:  Pt is alert, follows commands appropriately, not in acute distress  HEENT: No icterus, No thrush, No neck mass, Register/AT  Cardiovascular: RRR, S1/S2, no rubs, no gallops  Respiratory: Bibasilar crackles. No wheezing. Good air movement., no wheezing, no crackles, no rhonchi  Abdomen: Soft/+BS, non tender, non distended, no guarding  Extremities: No edema, No lymphangitis, No petechiae, No rashes, no synovitis; sacral wound approximately 6cm with undermining and bone palpable--approx 40% granulation   Data Reviewed: I have personally reviewed following labs and imaging studies Basic Metabolic Panel:  Recent Labs Lab 02/20/17 1535 02/21/17 0834 02/22/17 0451 02/23/17 0427 02/24/17 0418 02/26/17 0329  NA 145 144 142 138 137  --   K 3.7 3.5 3.8 3.8 3.8  --   CL 110 110 110 106 107  --   CO2 '24 24 24 23 23  ' --   GLUCOSE 154* 95 98 102* 112*  --   BUN 27* 23* '19 14 12  ' --   CREATININE 1.07 0.91 0.91 0.90 0.84 0.80  CALCIUM 8.5* 8.0* 7.6* 7.6*  7.4*  --   MG  --   --   --  1.9  --   --    Liver Function Tests:  Recent Labs Lab 02/20/17 1535  AST 53*  ALT 33  ALKPHOS 62  BILITOT 0.2*  PROT 6.7  ALBUMIN 2.7*   No results for input(s): LIPASE, AMYLASE in the last 168 hours. No results for input(s): AMMONIA in the last 168 hours. Coagulation Profile:  Recent Labs Lab 02/20/17 1535  INR 1.11   CBC:  Recent Labs Lab 02/20/17 1535 02/21/17 0834 02/22/17 0451 02/23/17 0427 02/25/17 0404  WBC 10.9* 10.3 14.3* 13.2* 12.9*  NEUTROABS 9.7*  --   --   --   --   HGB 5.8* 8.5* 8.6* 8.4* 9.8*  HCT 17.2* 25.1* 25.3* 25.4* 29.4*  MCV 95.6 91.6 92.0 93.4 93.0  PLT 324 264 251 294 428*   Cardiac Enzymes: No results for input(s): CKTOTAL, CKMB, CKMBINDEX, TROPONINI in the last 168 hours. BNP: Invalid input(s): POCBNP CBG: No results for input(s): GLUCAP in the last 168 hours. HbA1C: No results for input(s): HGBA1C in the last 72 hours. Urine analysis:    Component Value Date/Time   COLORURINE YELLOW 02/21/2017 0125   APPEARANCEUR HAZY (A) 02/21/2017 0125   LABSPEC 1.024 02/21/2017 0125   PHURINE 5.0 02/21/2017 0125   GLUCOSEU NEGATIVE 02/21/2017 0125   HGBUR NEGATIVE 02/21/2017 0125   BILIRUBINUR NEGATIVE 02/21/2017 0125   KETONESUR NEGATIVE 02/21/2017 0125   PROTEINUR NEGATIVE 02/21/2017  0125   NITRITE NEGATIVE 02/21/2017 0125   LEUKOCYTESUR NEGATIVE 02/21/2017 0125   Sepsis Labs: '@LABRCNTIP' (procalcitonin:4,lacticidven:4) ) Recent Results (from the past 240 hour(s))  MRSA PCR Screening     Status: None   Collection Time: 02/21/17  6:32 AM  Result Value Ref Range Status   MRSA by PCR NEGATIVE NEGATIVE Final    Comment:        The GeneXpert MRSA Assay (FDA approved for NASAL specimens only), is one component of a comprehensive MRSA colonization surveillance program. It is not intended to diagnose MRSA infection nor to guide or monitor treatment for MRSA infections.   Culture, blood (Routine X 2) w  Reflex to ID Panel     Status: None (Preliminary result)   Collection Time: 02/22/17  2:56 PM  Result Value Ref Range Status   Specimen Description BLOOD LEFT ANTECUBITAL  Final   Special Requests IN PEDIATRIC BOTTLE Blood Culture adequate volume  Final   Culture   Final    NO GROWTH 4 DAYS Performed at [facility], [place] [address], [place], [place] [address]    Report Status PENDING  Incomplete  Culture, blood (Routine X 2) w Reflex to ID Panel     Status: None (Preliminary result)   Collection Time: 02/22/17  2:59 PM  Result Value Ref Range Status   Specimen Description BLOOD RIGHT HAND  Final   Special Requests IN PEDIATRIC BOTTLE Blood Culture adequate volume  Final   Culture   Final    NO GROWTH 4 DAYS Performed at [facility], [place] [address] [place],  [address]    Report Status PENDING  Incomplete     Scheduled Meds: . donepezil  10 mg Oral QHS  . feeding supplement (ENSURE ENLIVE)  237 mL Oral BID BM  . finasteride  5 mg Oral Daily  . fluticasone  2 spray Each Nare Daily  . memantine  10 mg Oral BID  . montelukast  10 mg Oral QHS  . multivitamin with minerals  1 tablet Oral Daily  . omega-3 acid ethyl esters  1 g Oral Daily  . pantoprazole  40 mg Oral BID AC  . protein supplement  2 oz Oral QID  . risperiDONE  1 mg Oral BID  . sertraline  25 mg Oral Daily  . sodium hypochlorite   Irrigation Daily  . tamsulosin  0.4 mg Oral Daily  . vitamin B-12  500 mcg Oral Daily   Continuous Infusions: . 0.45 % NaCl with KCl 20 mEq / L 1,000 mL (02/26/17 0529)  . cefTRIAXone (ROCEPHIN)  IV Stopped (02/25/17 1810)  . metronidazole Stopped (02/26/17 1051)  . vancomycin 1,000 mg (02/26/17 1242)    Procedures/Studies: Dg Chest 2 View  Result Date: 02/20/2017 CLINICAL DATA:  Fatigue.  Dementia. EXAM: CHEST  2 VIEW COMPARISON:  One-view chest x-ray 01/31/2017. Two-view chest x-ray 01/16/2017. FINDINGS: The heart is mildly enlarged. Lung volumes are low. There  is no edema. There is some blunting of the CP angles posteriorly on both sides. Degenerative changes are again seen in the thoracic spine. IMPRESSION: 1. Blunting of posterior CP angle bilaterally appears chronic. No definite effusions. 2. Otherwise negative two view chest x-ray. Electronically Signed   By: San Morelle M.D.   On: 02/20/2017 15:24   Dg Pelvis 1-2 Views  Result Date: 01/31/2017 CLINICAL DATA:  Fall with buttock pain EXAM: PELVIS - 1-2 VIEW COMPARISON:  None. FINDINGS: SI joints are symmetric bilaterally. The pubic  symphysis appears intact. Both femoral heads appear normally positioned. IMPRESSION: No definite acute osseous abnormality Electronically Signed   By: Donavan Foil M.D.   On: 01/31/2017 23:42   Ct Head Wo Contrast  Result Date: 01/31/2017 CLINICAL DATA:  [place] last night.  Ecchymosis. EXAM: CT HEAD WITHOUT CONTRAST CT CERVICAL SPINE WITHOUT CONTRAST TECHNIQUE: Multidetector CT imaging of the head and cervical spine was performed following the standard protocol without intravenous contrast. Multiplanar CT image reconstructions of the cervical spine were also generated. COMPARISON:  01/16/2017 FINDINGS: CT HEAD FINDINGS Brain: There is no intracranial hemorrhage, mass or evidence of acute infarction. There is moderate generalized atrophy. There is moderate chronic microvascular ischemic change. There is no significant extra-axial fluid collection. No acute intracranial findings are evident. Vascular: No hyperdense vessel or unexpected calcification. Skull: Normal. Negative for fracture or focal lesion. Sinuses/Orbits: No acute finding. Other: None. CT CERVICAL SPINE FINDINGS Alignment: Normal. Skull base and vertebrae: No acute fracture. No primary bone lesion or focal pathologic process. Soft tissues and spinal canal: No prevertebral fluid or swelling. No visible canal hematoma. Disc levels: Moderate cervical degenerative disc changes at C5-6. Facet articulations are arthritic  but intact. Upper chest: Negative. Other: None IMPRESSION: 1. No acute intracranial findings. There is moderate generalized atrophy and chronic appearing white matter hypodensities which likely represent small vessel ischemic disease. 2. Negative for acute cervical spine fracture. Electronically Signed   By: Andreas Newport M.D.   On: 01/31/2017 23:44   Ct Cervical Spine Wo Contrast  Result Date: 01/31/2017 CLINICAL DATA:  [place] last night.  Ecchymosis. EXAM: CT HEAD WITHOUT CONTRAST CT CERVICAL SPINE WITHOUT CONTRAST TECHNIQUE: Multidetector CT imaging of the head and cervical spine was performed following the standard protocol without intravenous contrast. Multiplanar CT image reconstructions of the cervical spine were also generated. COMPARISON:  01/16/2017 FINDINGS: CT HEAD FINDINGS Brain: There is no intracranial hemorrhage, mass or evidence of acute infarction. There is moderate generalized atrophy. There is moderate chronic microvascular ischemic change. There is no significant extra-axial fluid collection. No acute intracranial findings are evident. Vascular: No hyperdense vessel or unexpected calcification. Skull: Normal. Negative for fracture or focal lesion. Sinuses/Orbits: No acute finding. Other: None. CT CERVICAL SPINE FINDINGS Alignment: Normal. Skull base and vertebrae: No acute fracture. No primary bone lesion or focal pathologic process. Soft tissues and spinal canal: No prevertebral fluid or swelling. No visible canal hematoma. Disc levels: Moderate cervical degenerative disc changes at C5-6. Facet articulations are arthritic but intact. Upper chest: Negative. Other: None IMPRESSION: 1. No acute intracranial findings. There is moderate generalized atrophy and chronic appearing white matter hypodensities which likely represent small vessel ischemic disease. 2. Negative for acute cervical spine fracture. Electronically Signed   By: Andreas Newport M.D.   On: 01/31/2017 23:44   Mr Pelvis Wo  Contrast  Result Date: 02/21/2017 CLINICAL DATA:  Sacral decubitus ulcer stage IV. Evaluate for possible osteomyelitis. EXAM: MRI PELVIS WITHOUT CONTRAST TECHNIQUE: Multiplanar multisequence MR imaging of the pelvis was performed. No intravenous contrast was administered. COMPARISON:  None. FINDINGS: Urinary Tract:  The urinary bladder is unremarkable. Bowel:  Unremarkable visualized pelvic bowel loops. Vascular/Lymphatic: No pathologically enlarged lymph nodes. No significant vascular abnormality seen. Reproductive:  No mass or other significant abnormality Other:  Diffuse anasarca about the pelvis and hips. Musculoskeletal: Subtle marrow signal abnormality involving the tip of the coccyx adjacent to a sacral decubitus ulcer raise concern for osteomyelitis, series 7, image 28 and series 6, image 5. Small joint effusion  of the left hip. Osteoarthritic joint space narrowing of both hips. No acute fracture of the bony pelvis. The visualized lower lumbar spine demonstrates lumbar spondylosis with multilevel disc space narrowing with osteophytes. IMPRESSION: 1. Diffuse soft tissue edema consistent with anasarca. 2. A sacral decubitus ulcer is noted with marrow signal abnormality involving the tip of the coccyx raising concern for osteomyelitis of the distal coccygeal segment. 3. Lumbar spondylosis. 4. Small left hip joint effusion.  No acute osseous abnormality. Electronically Signed   By: Ashley Royalty M.D.   On: 02/21/2017 18:15   US Abdomen Complete  Result Date: 02/01/2017 CLINICAL DATA:  Abnormal LFTs EXAM: ABDOMEN ULTRASOUND COMPLETE COMPARISON:  Ultrasound 01/08/2005 FINDINGS: Gallbladder: No gallstones or wall thickening visualized. No sonographic Murphy sign noted by sonographer. Common bile duct: Diameter: 4 mm in diameter within normal limits Liver: No focal lesion identified. Within normal limits in parenchymal echogenicity. IVC: No abnormality visualized. Pancreas: Visualized portion unremarkable.  Spleen: Size and appearance within normal limits. Measures 4 cm length Right Kidney: Length: 10.4 cm. Echogenicity within normal limits. No mass or hydronephrosis visualized. Left Kidney: Length: 10.4 cm. Echogenicity within normal limits. No hydronephrosis. There is a left renal cyst measures 9 x 9 mm. Abdominal aorta: No aneurysm visualized. Measures up to 2.2 cm in diameter Other findings: None. IMPRESSION: 1. No gallstones are noted within gallbladder.  Normal CBD. 2. No focal hepatic mass.  Normal liver echogenicity. 3. No hydronephrosis. No renal calculi. Left renal cyst measures 9 mm. 4. No aortic aneurysm. Electronically Signed   By: Lahoma Crocker M.D.   On: 02/01/2017 10:40   Dg Chest Port 1 View  Result Date: 01/31/2017 CLINICAL DATA:  Possible fall last evening.  Altered mental status. EXAM: PORTABLE CHEST 1 VIEW COMPARISON:  01/16/2017 CXR FINDINGS: The heart size and mediastinal contours are within normal limits. Atherosclerosis at the aortic arch without aneurysm. Both lungs are clear. The visualized skeletal structures are unremarkable. IMPRESSION: No active disease. Electronically Signed   By: Ashley Royalty M.D.   On: 01/31/2017 20:41    Annamary Buschman, DO  [facility] Pager [phone]  If 7PM-7AM, please contact night-coverage [email or web address] Password [email or web address] 02/26/2017, 1:44 PM   LOS: 6 days

## 2017-02-26 NOTE — Progress Notes (Addendum)
CM following along to assist with DC planning. Current plan for discharge back to [facility]. If pt does continue to need hydrotherapy at discharge a stay at [facility] could potentially be an option.  Sandford Craze RN,BSN,NCM [phone]

## 2017-02-26 NOTE — Progress Notes (Signed)
HYDROTHERAPY TREATMENT     02/26/17 1200  Subjective Assessment  Subjective "hello"  Patient and Family Stated Goals pt unable-no family present  Prior Treatments unknown  Evaluation and Treatment  Evaluation and Treatment Procedures Explained to Patient/Family Yes  Evaluation and Treatment Procedures Patient unable to consent due to mental status  Pressure Injury 02/22/17 Unstageable - Full thickness tissue loss in which the base of the ulcer is covered by slough (yellow, tan, gray, green or brown) and/or eschar (tan, brown or black) in the wound bed.  Date First Assessed/Time First Assessed: 02/22/17 1345   Location: Sacrum  Staging: Unstageable - Full thickness tissue loss in which the base of the ulcer is covered by slough (yellow, tan, gray, green or brown) and/or eschar (tan, brown or black) in...  Dressing Type ABD;Moist to moist (Dakin's soaked Kerlix (day 2)  Dressing Change Frequency Twice a day  % Wound base Red or Granulating 30%  % Wound base Yellow/Fibrinous Exudate 35%  % Wound base Black/Eschar 35%  Peri-wound Assessment Maceration;Pink;Excoriated (small blister)  Drainage Amount Moderate  Drainage Description Odor;Serosanguineous  Treatment Debridement (Selective);Hydrotherapy (Pulse lavage) (Dakin's soaked Kerlix (day 2)  Hydrotherapy  Pulsed lavage therapy - wound location sacrum  Pulsed Lavage with Suction (psi) 8 psi  Pulsed Lavage with Suction - Normal Saline Used 1000 mL  Pulsed Lavage Tip Tip with splash shield  Selective Debridement  Selective Debridement - Location sacrum  Selective Debridement - Tools Used Forceps;Scissors  Selective Debridement - Tissue Removed black necrotic tissue, yellow slough  Wound Therapy - Assess/Plan/Recommendations  Wound Therapy - Clinical Statement 76 yo male admitted with anemia. Hx of dementia. Mostly bedbound. Bedside debridement by surgery 02/22/17  Wound Therapy - Functional Problem List bedbound  Factors  Delaying/Impairing Wound Healing Infection - systemic/local;Incontinence;Immobility  Hydrotherapy Plan Debridement;Pulsatile lavage with suction;Patient/family education;Dressing change  Wound Therapy - Frequency 6X / week  Wound Therapy - Current Recommendations Case manager/social work  Wound Therapy - Follow Up Recommendations Skilled nursing facility  Wound Plan Hydrotherapy to promote wound healing. Wound slowly improving. Still has odor present but less compared to Saturday. Dakin's day 2 today. Spoke with WOC Nurse-recommended Dakin's for 3 days.   Wound Therapy Goals - Improve the function of patient's integumentary system by progressing the wound(s) through the phases of wound healing by:  Decrease Necrotic Tissue - Progress Progressing toward goal  Increase Granulation Tissue - Progress Progressing toward goal  Goals/treatment plan/discharge plan were made with and agreed upon by patient/family No, Patient unable to participate in goals/treatment/discharge plan and family unavailable  Time For Goal Achievement 2 weeks  Wound Therapy - Potential for Goals [place]ar Haw, MPT [phone]

## 2017-02-27 DIAGNOSIS — N289 Disorder of kidney and ureter, unspecified: Secondary | ICD-10-CM | POA: Diagnosis not present

## 2017-02-27 DIAGNOSIS — E46 Unspecified protein-calorie malnutrition: Secondary | ICD-10-CM | POA: Diagnosis not present

## 2017-02-27 DIAGNOSIS — R509 Fever, unspecified: Secondary | ICD-10-CM | POA: Diagnosis not present

## 2017-02-27 DIAGNOSIS — Z66 Do not resuscitate: Secondary | ICD-10-CM | POA: Diagnosis present

## 2017-02-27 DIAGNOSIS — R262 Difficulty in walking, not elsewhere classified: Secondary | ICD-10-CM | POA: Diagnosis not present

## 2017-02-27 DIAGNOSIS — R1312 Dysphagia, oropharyngeal phase: Secondary | ICD-10-CM | POA: Diagnosis not present

## 2017-02-27 DIAGNOSIS — D649 Anemia, unspecified: Secondary | ICD-10-CM | POA: Diagnosis present

## 2017-02-27 DIAGNOSIS — Z7189 Other specified counseling: Secondary | ICD-10-CM | POA: Diagnosis not present

## 2017-02-27 DIAGNOSIS — Z993 Dependence on wheelchair: Secondary | ICD-10-CM | POA: Diagnosis not present

## 2017-02-27 DIAGNOSIS — Z515 Encounter for palliative care: Secondary | ICD-10-CM | POA: Diagnosis present

## 2017-02-27 DIAGNOSIS — M4628 Osteomyelitis of vertebra, sacral and sacrococcygeal region: Secondary | ICD-10-CM

## 2017-02-27 DIAGNOSIS — R229 Localized swelling, mass and lump, unspecified: Secondary | ICD-10-CM | POA: Diagnosis not present

## 2017-02-27 DIAGNOSIS — R0989 Other specified symptoms and signs involving the circulatory and respiratory systems: Secondary | ICD-10-CM | POA: Diagnosis not present

## 2017-02-27 DIAGNOSIS — G9341 Metabolic encephalopathy: Secondary | ICD-10-CM | POA: Diagnosis present

## 2017-02-27 DIAGNOSIS — Z87891 Personal history of nicotine dependence: Secondary | ICD-10-CM | POA: Diagnosis not present

## 2017-02-27 DIAGNOSIS — M6281 Muscle weakness (generalized): Secondary | ICD-10-CM | POA: Diagnosis not present

## 2017-02-27 DIAGNOSIS — I82621 Acute embolism and thrombosis of deep veins of right upper extremity: Secondary | ICD-10-CM | POA: Diagnosis present

## 2017-02-27 DIAGNOSIS — A419 Sepsis, unspecified organism: Secondary | ICD-10-CM | POA: Diagnosis not present

## 2017-02-27 DIAGNOSIS — N401 Enlarged prostate with lower urinary tract symptoms: Secondary | ICD-10-CM | POA: Diagnosis not present

## 2017-02-27 DIAGNOSIS — F29 Unspecified psychosis not due to a substance or known physiological condition: Secondary | ICD-10-CM | POA: Diagnosis not present

## 2017-02-27 DIAGNOSIS — R4182 Altered mental status, unspecified: Secondary | ICD-10-CM | POA: Diagnosis present

## 2017-02-27 DIAGNOSIS — R5383 Other fatigue: Secondary | ICD-10-CM | POA: Diagnosis not present

## 2017-02-27 DIAGNOSIS — J45909 Unspecified asthma, uncomplicated: Secondary | ICD-10-CM | POA: Diagnosis present

## 2017-02-27 DIAGNOSIS — F329 Major depressive disorder, single episode, unspecified: Secondary | ICD-10-CM | POA: Diagnosis not present

## 2017-02-27 DIAGNOSIS — L89224 Pressure ulcer of left hip, stage 4: Secondary | ICD-10-CM | POA: Diagnosis not present

## 2017-02-27 DIAGNOSIS — E860 Dehydration: Secondary | ICD-10-CM | POA: Diagnosis present

## 2017-02-27 DIAGNOSIS — Z9181 History of falling: Secondary | ICD-10-CM | POA: Diagnosis not present

## 2017-02-27 DIAGNOSIS — R112 Nausea with vomiting, unspecified: Secondary | ICD-10-CM | POA: Diagnosis not present

## 2017-02-27 DIAGNOSIS — Z79899 Other long term (current) drug therapy: Secondary | ICD-10-CM | POA: Diagnosis not present

## 2017-02-27 DIAGNOSIS — E440 Moderate protein-calorie malnutrition: Secondary | ICD-10-CM | POA: Diagnosis present

## 2017-02-27 DIAGNOSIS — E870 Hyperosmolality and hypernatremia: Secondary | ICD-10-CM

## 2017-02-27 DIAGNOSIS — Z681 Body mass index (BMI) 19 or less, adult: Secondary | ICD-10-CM | POA: Diagnosis not present

## 2017-02-27 DIAGNOSIS — L89220 Pressure ulcer of left hip, unstageable: Secondary | ICD-10-CM | POA: Diagnosis not present

## 2017-02-27 DIAGNOSIS — D62 Acute posthemorrhagic anemia: Secondary | ICD-10-CM

## 2017-02-27 DIAGNOSIS — G9340 Encephalopathy, unspecified: Secondary | ICD-10-CM | POA: Diagnosis not present

## 2017-02-27 DIAGNOSIS — N400 Enlarged prostate without lower urinary tract symptoms: Secondary | ICD-10-CM | POA: Diagnosis present

## 2017-02-27 DIAGNOSIS — L89614 Pressure ulcer of right heel, stage 4: Secondary | ICD-10-CM | POA: Diagnosis not present

## 2017-02-27 DIAGNOSIS — E785 Hyperlipidemia, unspecified: Secondary | ICD-10-CM | POA: Diagnosis present

## 2017-02-27 DIAGNOSIS — K219 Gastro-esophageal reflux disease without esophagitis: Secondary | ICD-10-CM | POA: Diagnosis not present

## 2017-02-27 DIAGNOSIS — M7989 Other specified soft tissue disorders: Secondary | ICD-10-CM | POA: Diagnosis not present

## 2017-02-27 DIAGNOSIS — F339 Major depressive disorder, recurrent, unspecified: Secondary | ICD-10-CM | POA: Diagnosis not present

## 2017-02-27 DIAGNOSIS — R05 Cough: Secondary | ICD-10-CM | POA: Diagnosis not present

## 2017-02-27 DIAGNOSIS — L89154 Pressure ulcer of sacral region, stage 4: Secondary | ICD-10-CM | POA: Diagnosis present

## 2017-02-27 DIAGNOSIS — M25519 Pain in unspecified shoulder: Secondary | ICD-10-CM | POA: Diagnosis not present

## 2017-02-27 DIAGNOSIS — R609 Edema, unspecified: Secondary | ICD-10-CM | POA: Diagnosis not present

## 2017-02-27 DIAGNOSIS — E876 Hypokalemia: Secondary | ICD-10-CM | POA: Diagnosis present

## 2017-02-27 DIAGNOSIS — L97519 Non-pressure chronic ulcer of other part of right foot with unspecified severity: Secondary | ICD-10-CM | POA: Diagnosis not present

## 2017-02-27 DIAGNOSIS — R031 Nonspecific low blood-pressure reading: Secondary | ICD-10-CM | POA: Diagnosis not present

## 2017-02-27 DIAGNOSIS — R627 Adult failure to thrive: Secondary | ICD-10-CM | POA: Diagnosis present

## 2017-02-27 DIAGNOSIS — L89613 Pressure ulcer of right heel, stage 3: Secondary | ICD-10-CM | POA: Diagnosis present

## 2017-02-27 DIAGNOSIS — M79609 Pain in unspecified limb: Secondary | ICD-10-CM | POA: Diagnosis not present

## 2017-02-27 DIAGNOSIS — R531 Weakness: Secondary | ICD-10-CM | POA: Diagnosis not present

## 2017-02-27 DIAGNOSIS — I10 Essential (primary) hypertension: Secondary | ICD-10-CM | POA: Diagnosis present

## 2017-02-27 DIAGNOSIS — L89143 Pressure ulcer of left lower back, stage 3: Secondary | ICD-10-CM | POA: Diagnosis not present

## 2017-02-27 DIAGNOSIS — L89893 Pressure ulcer of other site, stage 3: Secondary | ICD-10-CM | POA: Diagnosis not present

## 2017-02-27 DIAGNOSIS — M79673 Pain in unspecified foot: Secondary | ICD-10-CM | POA: Diagnosis not present

## 2017-02-27 DIAGNOSIS — M6282 Rhabdomyolysis: Secondary | ICD-10-CM | POA: Diagnosis not present

## 2017-02-27 DIAGNOSIS — K922 Gastrointestinal hemorrhage, unspecified: Secondary | ICD-10-CM | POA: Diagnosis not present

## 2017-02-27 DIAGNOSIS — F0390 Unspecified dementia without behavioral disturbance: Secondary | ICD-10-CM | POA: Diagnosis present

## 2017-02-27 DIAGNOSIS — L89210 Pressure ulcer of right hip, unstageable: Secondary | ICD-10-CM | POA: Diagnosis not present

## 2017-02-27 DIAGNOSIS — R41840 Attention and concentration deficit: Secondary | ICD-10-CM | POA: Diagnosis not present

## 2017-02-27 DIAGNOSIS — R195 Other fecal abnormalities: Secondary | ICD-10-CM | POA: Diagnosis not present

## 2017-02-27 LAB — CBC
HCT: 24.8 % — ABNORMAL LOW (ref 39.0–52.0)
HEMOGLOBIN: 8.4 g/dL — AB (ref 13.0–17.0)
MCH: 31.2 pg (ref 26.0–34.0)
MCHC: 33.9 g/dL (ref 30.0–36.0)
MCV: 92.2 fL (ref 78.0–100.0)
Platelets: 462 10*3/uL — ABNORMAL HIGH (ref 150–400)
RBC: 2.69 MIL/uL — AB (ref 4.22–5.81)
RDW: 14.4 % (ref 11.5–15.5)
WBC: 9.5 10*3/uL (ref 4.0–10.5)

## 2017-02-27 LAB — CULTURE, BLOOD (ROUTINE X 2)
CULTURE: NO GROWTH
Culture: NO GROWTH
Special Requests: ADEQUATE
Special Requests: ADEQUATE

## 2017-02-27 LAB — SEDIMENTATION RATE: Sed Rate: 75 mm/hr — ABNORMAL HIGH (ref 0–16)

## 2017-02-27 LAB — BASIC METABOLIC PANEL
ANION GAP: 7 (ref 5–15)
BUN: 13 mg/dL (ref 6–20)
CALCIUM: 7.6 mg/dL — AB (ref 8.9–10.3)
CHLORIDE: 107 mmol/L (ref 101–111)
CO2: 24 mmol/L (ref 22–32)
CREATININE: 0.95 mg/dL (ref 0.61–1.24)
GFR calc non Af Amer: >60 mL/min (ref >60)
Glucose, Bld: 121 mg/dL — ABNORMAL HIGH (ref 65–99)
Potassium: 3.6 mmol/L (ref 3.5–5.1)
Sodium: 138 mmol/L (ref 135–145)

## 2017-02-27 LAB — VANCOMYCIN, TROUGH: VANCOMYCIN TR: 22 ug/mL — AB (ref 15–20)

## 2017-02-27 LAB — C-REACTIVE PROTEIN: CRP: 11.3 mg/dL — AB (ref <1.0)

## 2017-02-27 MED ORDER — METRONIDAZOLE 500 MG PO TABS: 500.0000 mg | ORAL_TABLET | Freq: Three times a day (TID) | ORAL | Status: AC

## 2017-02-27 MED ORDER — OXYCODONE-ACETAMINOPHEN 5-325 MG PO TABS: 1.0000 | ORAL_TABLET | Freq: Four times a day (QID) | ORAL | 0 refills | Status: DC | PRN

## 2017-02-27 MED ORDER — DEXTROSE 5 % IV SOLN: 2.0000 g | INTRAVENOUS | Status: DC

## 2017-02-27 MED ORDER — SODIUM CHLORIDE 0.9% FLUSH: 10.0000 mL | INTRAVENOUS | Status: DC | PRN

## 2017-02-27 MED ORDER — VANCOMYCIN HCL IN DEXTROSE 750-5 MG/150ML-% IV SOLN
750.0000 mg | Freq: Two times a day (BID) | INTRAVENOUS | Status: DC
  Filled 2017-02-27: qty 150

## 2017-02-27 MED ORDER — PANTOPRAZOLE SODIUM 40 MG PO TBEC: 40.0000 mg | DELAYED_RELEASE_TABLET | Freq: Two times a day (BID) | ORAL | Status: DC

## 2017-02-27 MED ORDER — METRONIDAZOLE 500 MG PO TABS: 500.0000 mg | ORAL_TABLET | Freq: Three times a day (TID) | ORAL | 1 refills | Status: DC

## 2017-02-27 MED ORDER — METRONIDAZOLE 500 MG PO TABS: 500.0000 mg | ORAL_TABLET | Freq: Three times a day (TID) | ORAL | Status: DC

## 2017-02-27 MED ORDER — DEXTROSE 5 % IV SOLN: 2.0000 g | INTRAVENOUS | Status: AC

## 2017-02-27 MED ORDER — UNJURY CHICKEN SOUP POWDER: 2.0000 [oz_av] | Freq: Four times a day (QID) | ORAL | Status: DC

## 2017-02-27 MED ORDER — SODIUM CHLORIDE 0.9% FLUSH: 10.0000 mL | Freq: Two times a day (BID) | INTRAVENOUS | Status: DC

## 2017-02-27 NOTE — Progress Notes (Signed)
Pharmacy Antibiotic Follow-up Note  Andre Ball is a 76 y.o. year-old male admitted on 02/20/2017.  The patient is currently on vancomycin per pharmacy (and ceftriaxone, metronidazole) for osteomyelitis.  Today, 02/27/2017  Day #7 antibiotics  Renal: SCr remains WNL  WBC now wnl, Afebrile  Vancomycin trough was within goal 4/28  4/26 MRI: A sacral decubitus ulcer is noted with marrow signal abnormality involving the tip of the coccyx raising concern for osteomyelitis of the distal coccygeal segment.  4/26 Bedside debridement  CRP elevated at 19.3, Sed rate inc at 60  5/2 Vanc trough elevated at 22 mcg/ml on  q12  Assessment/Plan:  Reduce Vancomycin to   IV q12h (goal trough 15-20 mcg/mL for osteomyelitis).   Monitor renal function  Ceftriaxone and metronidazole per [facility]  Temp (24hrs), Avg:98.7 F (37.1 C), Min:98.2 F (36.8 C), Max:99.4 F (37.4 C)   Recent Labs Lab 02/21/17 0834 02/22/17 0451 02/23/17 0427 02/25/17 0404 02/27/17 0348  WBC 10.3 14.3* 13.2* 12.9* 9.5     Recent Labs Lab 02/22/17 0451 02/23/17 0427 02/24/17 0418 02/26/17 0329 02/27/17 0348  CREATININE 0.91 0.90 0.84 0.80 0.95   Estimated Creatinine Clearance: 70.4 mL/min (by C-G formula based on SCr of 0.95 mg/dL).    No Known Allergies  Antimicrobials this admission: 4/25 Cefepime >>4/27 4/25 Vancomycin >> 4/27 Rocephin >> 4/27 Flagyl >>  Levels/dose changes this admission: 4/28 0930 VT = 18 mcg/mL on 1gm q12h (prior to 6th overall dose) 5/2 1130 VT = 22 mcg/mL on 1gm q12h Microbiology results: 4/26 MRSA PCR: neg 4/27 BCx: ng-final  Thank you for allowing pharmacy to be a part of this patient's care.  Otho Bellows PharmD Pager [phone] 02/27/2017, 1:53 PM

## 2017-02-27 NOTE — Discharge Summary (Addendum)
Physician Discharge Summary  Andre Ball CBU:[id number] DOB: 1940-12-09 DOA: 02/20/2017  PCP: Maggie Font, MD  Admit date: 02/20/2017 Discharge date: 02/27/2017  Admitted From: SNF Disposition: SNF  Recommendations for Outpatient Follow-up:  1. Follow up with PCP in 1 week 2. Please obtain BMP/CBC in one week 3. Recommend outpatient palliative care referral 4. Continue twice daily wet-to-dry dressing changes  Discharge Condition: Guarded CODE STATUS: DNR/DNI Diet recommendation: Soft diet, thin liquids   Brief/Interim Summary:  Admission HPI written by Orson Eva, MD   Chief Complaint: fatigue  HPI:  Andre Ball is a 76 y.o. male with medical history of dementia, BPH, hypertension, hyperlipidemia presenting from the nursing home secondary to fatigue. Unfortunately, the patient has a history of dementia, and he is unable to provide any history. All of this history is obtained from review of the medical record in speaking with the patient's wife. The patient was recently discharged from the hospital after a stay from 01/31/2017 through 02/04/2017 during which time the patient was treated for rhabdomyolysis secondary to frequent falls as well as his sacral and foot decubitus. The patient was discharged in stable condition to skilled nursing facility. His hemoglobin was 11.6 at the time of discharge. The patient was noted to have increasing fatigue and lethargy. Routine blood work was done and showed a hemoglobin in the 5 range. As a result, the patient was brought to the emergency department for further evaluation. There's been no recent hematemesis, hematuria, hematochezia, melena per his wife. The patient has not been started on any NSAIDs recently. There's been no fevers, chills, chest pain, abdominal pain.  In the emergency department, the patient was afebrile hemodynamically stable saturating 99% on room air. Sodium was 145. Otherwise BMP was unremarkable. Hemoglobin was noted  to be 5.8 with the BBC 10.9 and platelets 224,000. INR was 1.11. Lactic acid was 2.1. EKG shows sinus rhythm with nonspecific T-wave changes. Chest x-ray was negative for any infiltrates or edema. Fecal occult blood test was positive without hematochezia per EDP.    Hospital course:  Acute blood loss anemia Heme positive stool Symptomatic anemia Presumed GI bleeding possibly secondary to NSAID use. NSAIDs discontinued and patient started on twice daily Protonix. GI consulted and deferred endoscopy unless recurrent bleeding. Patient received three units of packed red blood cells on 4/25-4/26 and his hemoglobin has remained stable. Hold aspirin for 3-4 weeks on discharge. Avoid NSAIDs.  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 Patient started on broad spectrum IV antibiotics, including vancomycin, ceftriaxone and metronidazole. General surgery evaluated and performed bedside debridement. Patient also underwent hydrotherapy. Infectious disease consulted and recommended a final regimen of ceftriaxone and metronidazole. End date is 04/05/2017 to complete a six week course. Blood cultures negative.  Acute metabolic encephalopathy Secondary to blood loss versus infectious process. Improved to baseline.  Right heel decubitus Wound care.  Essential hypertension Lisinopril held. Acceptable blood pressure.  Hypernatremia Secondary to dehydration. Improved with hypotonic IV fluids  Dementia Stable at discharge. Continued Aricept and Namenda  Hyperglycemia A1C of 5.2. Secondary to stress versus infection.  BPH Continued tamsulosin  and finasteride   Discharge Diagnoses:  Active Problems:   Hypernatremia   General weakness   Essential hypertension   Dementia   Acute blood loss anemia   Sacral decubitus ulcer, stage IV (HCC)   Osteomyelitis of vertebra, sacral and sacrococcygeal region [facility])    Discharge Instructions  Discharge  Instructions    Call MD  for:  difficulty breathing, headache or visual disturbances    Complete by:  As directed    Call MD for:  persistant nausea and vomiting    Complete by:  As directed    Call MD for:  severe uncontrolled pain    Complete by:  As directed    Call MD for:  temperature >100.4    Complete by:  As directed      Allergies as of 02/27/2017   No Known Allergies     Medication List    STOP taking these medications   aspirin EC 81 MG tablet   lisinopril 10 MG tablet Commonly known as:  PRINIVIL,ZESTRIL   oxaprozin 600 MG tablet Commonly known as:  DAYPRO   sodium hypochlorite 0.125 % Soln Commonly known as:  DAKIN'S 1/4 STRENGTH     TAKE these medications   cefTRIAXone 2 g in dextrose 5 % 50 mL Inject 2 g into the vein daily. Indication:  Osteomyelitis Last Day of Therapy:  04/05/2017 Labs - Once weekly:  CBC/D and BMP, Labs - Every other week:  ESR and CRP Start taking on:  02/28/2017   collagenase ointment Commonly known as:  SANTYL Apply 1 application topically daily. What changed:  when to take this  additional instructions   diclofenac sodium 1 % Gel Commonly known as:  VOLTAREN Apply 1 application topically daily as needed (for leg/shoulder pain).   donepezil 10 MG tablet Commonly known as:  ARICEPT Take 1 tablet (10 mg total) by mouth at bedtime.   feeding supplement (ENSURE ENLIVE) Liqd Take 237 mLs by mouth 2 (two) times daily between meals.   finasteride 5 MG tablet Commonly known as:  PROSCAR Take 5 mg by mouth daily.   fluticasone 50 MCG/ACT nasal spray Commonly known as:  FLONASE Place 2 sprays into the nose daily.   memantine 10 MG tablet Commonly known as:  NAMENDA Take 1 tablet (10 mg total) by mouth 2 (two) times daily.   metroNIDAZOLE 500 MG tablet Commonly known as:  FLAGYL Take 1 tablet (500 mg total) by mouth 3 (three) times daily. End date 04/05/2017   montelukast 10 MG tablet Commonly known as:  SINGULAIR Take 10 mg  by mouth at bedtime.   multivitamin with minerals Tabs tablet Take 1 tablet by mouth daily.   NITROSTAT 0.4 MG SL tablet Generic drug:  nitroGLYCERIN Place 0.4 mg under the tongue every 5 (five) minutes as needed for chest pain.   omega-3 acid ethyl esters 1 g capsule Commonly known as:  LOVAZA Take 1 g by mouth daily.   oxyCODONE-acetaminophen 5-325 MG tablet Commonly known as:  PERCOCET/ROXICET Take 1 tablet by mouth every 6 (six) hours as needed for moderate pain.   pantoprazole 40 MG tablet Commonly known as:  PROTONIX Take 1 tablet (40 mg total) by mouth 2 (two) times daily before a meal.   protein supplement Powd Commonly known as:  UNJURY CHICKEN SOUP Take 7 g (2 oz total) by mouth 4 (four) times daily.   risperiDONE 1 MG tablet Commonly known as:  RISPERDAL Take 1 mg by mouth 2 (two) times daily.   sertraline 25 MG tablet Commonly known as:  ZOLOFT Take 25 mg by mouth daily.   tamsulosin 0.4 MG Caps capsule Commonly known as:  FLOMAX Take 0.4 mg by mouth daily.   vitamin B-12 500 MCG tablet Commonly known as:  CYANOCOBALAMIN Take 500 mcg by mouth daily.       No Known  Allergies  Consultations:  [facility]  General surgery  Palliative care medicine   Procedures/Studies: Dg Chest 2 View  Result Date: 02/20/2017 CLINICAL DATA:  Fatigue.  Dementia. EXAM: CHEST  2 VIEW COMPARISON:  One-view chest x-ray 01/31/2017. Two-view chest x-ray 01/16/2017. FINDINGS: The heart is mildly enlarged. Lung volumes are low. There is no edema. There is some blunting of the CP angles posteriorly on both sides. Degenerative changes are again seen in the thoracic spine. IMPRESSION: 1. Blunting of posterior CP angle bilaterally appears chronic. No definite effusions. 2. Otherwise negative two view chest x-ray. Electronically Signed   By: San Morelle M.D.   On: 02/20/2017 15:24   Dg Pelvis 1-2 Views  Result Date: 01/31/2017 CLINICAL DATA:  Fall with  buttock pain EXAM: PELVIS - 1-2 VIEW COMPARISON:  None. FINDINGS: SI joints are symmetric bilaterally. The pubic symphysis appears intact. Both femoral heads appear normally positioned. IMPRESSION: No definite acute osseous abnormality Electronically Signed   By: Donavan Foil M.D.   On: 01/31/2017 23:42   Ct Head Wo Contrast  Result Date: 01/31/2017 CLINICAL DATA:  [place] last night.  Ecchymosis. EXAM: CT HEAD WITHOUT CONTRAST CT CERVICAL SPINE WITHOUT CONTRAST TECHNIQUE: Multidetector CT imaging of the head and cervical spine was performed following the standard protocol without intravenous contrast. Multiplanar CT image reconstructions of the cervical spine were also generated. COMPARISON:  01/16/2017 FINDINGS: CT HEAD FINDINGS Brain: There is no intracranial hemorrhage, mass or evidence of acute infarction. There is moderate generalized atrophy. There is moderate chronic microvascular ischemic change. There is no significant extra-axial fluid collection. No acute intracranial findings are evident. Vascular: No hyperdense vessel or unexpected calcification. Skull: Normal. Negative for fracture or focal lesion. Sinuses/Orbits: No acute finding. Other: None. CT CERVICAL SPINE FINDINGS Alignment: Normal. Skull base and vertebrae: No acute fracture. No primary bone lesion or focal pathologic process. Soft tissues and spinal canal: No prevertebral fluid or swelling. No visible canal hematoma. Disc levels: Moderate cervical degenerative disc changes at C5-6. Facet articulations are arthritic but intact. Upper chest: Negative. Other: None IMPRESSION: 1. No acute intracranial findings. There is moderate generalized atrophy and chronic appearing white matter hypodensities which likely represent small vessel ischemic disease. 2. Negative for acute cervical spine fracture. Electronically Signed   By: Andreas Newport M.D.   On: 01/31/2017 23:44   Ct Cervical Spine Wo Contrast  Result Date: 01/31/2017 CLINICAL DATA:   [place] last night.  Ecchymosis. EXAM: CT HEAD WITHOUT CONTRAST CT CERVICAL SPINE WITHOUT CONTRAST TECHNIQUE: Multidetector CT imaging of the head and cervical spine was performed following the standard protocol without intravenous contrast. Multiplanar CT image reconstructions of the cervical spine were also generated. COMPARISON:  01/16/2017 FINDINGS: CT HEAD FINDINGS Brain: There is no intracranial hemorrhage, mass or evidence of acute infarction. There is moderate generalized atrophy. There is moderate chronic microvascular ischemic change. There is no significant extra-axial fluid collection. No acute intracranial findings are evident. Vascular: No hyperdense vessel or unexpected calcification. Skull: Normal. Negative for fracture or focal lesion. Sinuses/Orbits: No acute finding. Other: None. CT CERVICAL SPINE FINDINGS Alignment: Normal. Skull base and vertebrae: No acute fracture. No primary bone lesion or focal pathologic process. Soft tissues and spinal canal: No prevertebral fluid or swelling. No visible canal hematoma. Disc levels: Moderate cervical degenerative disc changes at C5-6. Facet articulations are arthritic but intact. Upper chest: Negative. Other: None IMPRESSION: 1. No acute intracranial findings. There is moderate generalized atrophy and chronic appearing white matter hypodensities which likely represent  small vessel ischemic disease. 2. Negative for acute cervical spine fracture. Electronically Signed   By: Andreas Newport M.D.   On: 01/31/2017 23:44   Mr Pelvis Wo Contrast  Result Date: 02/21/2017 CLINICAL DATA:  Sacral decubitus ulcer stage IV. Evaluate for possible osteomyelitis. EXAM: MRI PELVIS WITHOUT CONTRAST TECHNIQUE: Multiplanar multisequence MR imaging of the pelvis was performed. No intravenous contrast was administered. COMPARISON:  None. FINDINGS: Urinary Tract:  The urinary bladder is unremarkable. Bowel:  Unremarkable visualized pelvic bowel loops. Vascular/Lymphatic: No  pathologically enlarged lymph nodes. No significant vascular abnormality seen. Reproductive:  No mass or other significant abnormality Other:  Diffuse anasarca about the pelvis and hips. Musculoskeletal: Subtle marrow signal abnormality involving the tip of the coccyx adjacent to a sacral decubitus ulcer raise concern for osteomyelitis, series 7, image 28 and series 6, image 5. Small joint effusion of the left hip. Osteoarthritic joint space narrowing of both hips. No acute fracture of the bony pelvis. The visualized lower lumbar spine demonstrates lumbar spondylosis with multilevel disc space narrowing with osteophytes. IMPRESSION: 1. Diffuse soft tissue edema consistent with anasarca. 2. A sacral decubitus ulcer is noted with marrow signal abnormality involving the tip of the coccyx raising concern for osteomyelitis of the distal coccygeal segment. 3. Lumbar spondylosis. 4. Small left hip joint effusion.  No acute osseous abnormality. Electronically Signed   By: Ashley Royalty M.D.   On: 02/21/2017 18:15   US Abdomen Complete  Result Date: 02/01/2017 CLINICAL DATA:  Abnormal LFTs EXAM: ABDOMEN ULTRASOUND COMPLETE COMPARISON:  Ultrasound 01/08/2005 FINDINGS: Gallbladder: No gallstones or wall thickening visualized. No sonographic Murphy sign noted by sonographer. Common bile duct: Diameter: 4 mm in diameter within normal limits Liver: No focal lesion identified. Within normal limits in parenchymal echogenicity. IVC: No abnormality visualized. Pancreas: Visualized portion unremarkable. Spleen: Size and appearance within normal limits. Measures 4 cm length Right Kidney: Length: 10.4 cm. Echogenicity within normal limits. No mass or hydronephrosis visualized. Left Kidney: Length: 10.4 cm. Echogenicity within normal limits. No hydronephrosis. There is a left renal cyst measures 9 x 9 mm. Abdominal aorta: No aneurysm visualized. Measures up to 2.2 cm in diameter Other findings: None. IMPRESSION: 1. No gallstones are  noted within gallbladder.  Normal CBD. 2. No focal hepatic mass.  Normal liver echogenicity. 3. No hydronephrosis. No renal calculi. Left renal cyst measures 9 mm. 4. No aortic aneurysm. Electronically Signed   By: Lahoma Crocker M.D.   On: 02/01/2017 10:40   Dg Chest Port 1 View  Result Date: 01/31/2017 CLINICAL DATA:  Possible fall last evening.  Altered mental status. EXAM: PORTABLE CHEST 1 VIEW COMPARISON:  01/16/2017 CXR FINDINGS: The heart size and mediastinal contours are within normal limits. Atherosclerosis at the aortic arch without aneurysm. Both lungs are clear. The visualized skeletal structures are unremarkable. IMPRESSION: No active disease. Electronically Signed   By: Ashley Royalty M.D.   On: 01/31/2017 20:41      Subjective: Patient reports no issues overnight. No pain.  Discharge Exam: Vitals:   02/27/17 0400 02/27/17 1311  BP: (!) 119/53 (!) 133/52  Pulse: 69 74  Resp: 18 18  Temp: 98.4 F (36.9 C) 98.9 F (37.2 C)   Vitals:   02/26/17 1415 02/26/17 2126 02/27/17 0400 02/27/17 1311  BP: (!) 119/94 112/75 (!) 119/53 (!) 133/52  Pulse: 77 78 69 74  Resp: '17 18 18 18  ' Temp: 98.2 F (36.8 C) 99.4 F (37.4 C) 98.4 F (36.9 C) 98.9 F (37.2 C)  TempSrc: Axillary Oral Oral Oral  SpO2: 97% 97% 98% 99%  Weight:      Height:        General: Pt is alert, awake, not in acute distress Cardiovascular: RRR, S1/S2 +, no rubs, no gallops Respiratory: CTA bilaterally, no wheezing, no rhonchi Abdominal: Soft, NT, ND, bowel sounds + Extremities: no edema, no cyanosis    The results of significant diagnostics from this hospitalization (including imaging, microbiology, ancillary and laboratory) are listed below for reference.     Microbiology: Recent Results (from the past 240 hour(s))  MRSA PCR Screening     Status: None   Collection Time: 02/21/17  6:32 AM  Result Value Ref Range Status   MRSA by PCR NEGATIVE NEGATIVE Final    Comment:        The GeneXpert MRSA Assay  (FDA approved for NASAL specimens only), is one component of a comprehensive MRSA colonization surveillance program. It is not intended to diagnose MRSA infection nor to guide or monitor treatment for MRSA infections.   Culture, blood (Routine X 2) w Reflex to ID Panel     Status: None   Collection Time: 02/22/17  2:56 PM  Result Value Ref Range Status   Specimen Description BLOOD LEFT ANTECUBITAL  Final   Special Requests IN PEDIATRIC BOTTLE Blood Culture adequate volume  Final   Culture   Final    NO GROWTH 5 DAYS Performed at [facility], [place] [address], [place], [place] [address]    Report Status 02/27/2017 FINAL  Final  Culture, blood (Routine X 2) w Reflex to ID Panel     Status: None   Collection Time: 02/22/17  2:59 PM  Result Value Ref Range Status   Specimen Description BLOOD RIGHT HAND  Final   Special Requests IN PEDIATRIC BOTTLE Blood Culture adequate volume  Final   Culture   Final    NO GROWTH 5 DAYS Performed at [facility], [place] [address], [place], [place] [address]    Report Status 02/27/2017 FINAL  Final     Labs: BNP (last 3 results)  Recent Labs  02/20/17 1535  BNP 883.2*   Basic Metabolic Panel:  Recent Labs Lab 02/21/17 0834 02/22/17 0451 02/23/17 0427 02/24/17 0418 02/26/17 0329 02/27/17 0348  NA 144 142 138 137  --  138  K 3.5 3.8 3.8 3.8  --  3.6  CL 110 110 106 107  --  107  CO2 '24 24 23 23  ' --  24  GLUCOSE 95 98 102* 112*  --  121*  BUN 23* '19 14 12  ' --  13  CREATININE 0.91 0.91 0.90 0.84 0.80 0.95  CALCIUM 8.0* 7.6* 7.6* 7.4*  --  7.6*  MG  --   --  1.9  --   --   --    Liver Function Tests:  Recent Labs Lab 02/20/17 1535  AST 53*  ALT 33  ALKPHOS 62  BILITOT 0.2*  PROT 6.7  ALBUMIN 2.7*   No results for input(s): LIPASE, AMYLASE in the last 168 hours. No results for input(s): AMMONIA in the last 168 hours. CBC:  Recent Labs Lab 02/20/17 1535 02/21/17 0834 02/22/17 0451 02/23/17 0427  02/25/17 0404 02/27/17 0348  WBC 10.9* 10.3 14.3* 13.2* 12.9* 9.5  NEUTROABS 9.7*  --   --   --   --   --   HGB 5.8* 8.5* 8.6* 8.4* 9.8* 8.4*  HCT 17.2* 25.1* 25.3* 25.4* 29.4*  24.8*  MCV 95.6 91.6 92.0 93.4 93.0 92.2  PLT 324 264 251 294 428* 462*   Cardiac Enzymes: No results for input(s): CKTOTAL, CKMB, CKMBINDEX, TROPONINI in the last 168 hours. BNP: Invalid input(s): POCBNP CBG: No results for input(s): GLUCAP in the last 168 hours. D-Dimer No results for input(s): DDIMER in the last 72 hours. Hgb A1c No results for input(s): HGBA1C in the last 72 hours. Lipid Profile No results for input(s): CHOL, HDL, LDLCALC, TRIG, CHOLHDL, LDLDIRECT in the last 72 hours. Thyroid function studies No results for input(s): TSH, T4TOTAL, T3FREE, THYROIDAB in the last 72 hours.  Invalid input(s): FREET3 Anemia work up No results for input(s): VITAMINB12, FOLATE, FERRITIN, TIBC, IRON, RETICCTPCT in the last 72 hours. Urinalysis    Component Value Date/Time   COLORURINE YELLOW 02/21/2017 0125   APPEARANCEUR HAZY (A) 02/21/2017 0125   LABSPEC 1.024 02/21/2017 0125   PHURINE 5.0 02/21/2017 0125   GLUCOSEU NEGATIVE 02/21/2017 0125   HGBUR NEGATIVE 02/21/2017 0125   BILIRUBINUR NEGATIVE 02/21/2017 0125   KETONESUR NEGATIVE 02/21/2017 0125   PROTEINUR NEGATIVE 02/21/2017 0125   NITRITE NEGATIVE 02/21/2017 0125   LEUKOCYTESUR NEGATIVE 02/21/2017 0125   Sepsis Labs Invalid input(s): PROCALCITONIN,  WBC,  LACTICIDVEN Microbiology Recent Results (from the past 240 hour(s))  MRSA PCR Screening     Status: None   Collection Time: 02/21/17  6:32 AM  Result Value Ref Range Status   MRSA by PCR NEGATIVE NEGATIVE Final    Comment:        The GeneXpert MRSA Assay (FDA approved for NASAL specimens only), is one component of a comprehensive MRSA colonization surveillance program. It is not intended to diagnose MRSA infection nor to guide or monitor treatment for MRSA infections.    Culture, blood (Routine X 2) w Reflex to ID Panel     Status: None   Collection Time: 02/22/17  2:56 PM  Result Value Ref Range Status   Specimen Description BLOOD LEFT ANTECUBITAL  Final   Special Requests IN PEDIATRIC BOTTLE Blood Culture adequate volume  Final   Culture   Final    NO GROWTH 5 DAYS Performed at [facility], [place] [address] [place], [place] [address]    Report Status 02/27/2017 FINAL  Final  Culture, blood (Routine X 2) w Reflex to ID Panel     Status: None   Collection Time: 02/22/17  2:59 PM  Result Value Ref Range Status   Specimen Description BLOOD RIGHT HAND  Final   Special Requests IN PEDIATRIC BOTTLE Blood Culture adequate volume  Final   Culture   Final    NO GROWTH 5 DAYS Performed at [facility], [place] [address], [place], [place] [address]    Report Status 02/27/2017 FINAL  Final     Time coordinating discharge: Over 30 minutes  SIGNED:   Cordelia Poche, MD [facility] 02/27/2017, 3:09 PM Pager [phone]  If 7PM-7AM, please contact night-coverage [email or web address] Password [email or web address]

## 2017-02-27 NOTE — Progress Notes (Signed)
RN called and gave report to SNF.

## 2017-02-27 NOTE — Progress Notes (Signed)
CRITICAL VALUE ALERT  Critical value received: VANC  Tr  22  Date of notification:  02/27/17  Time of notification:  1326  Critical value read back: yes  Nurse who received alert:  Jilda Panda  MD notified (1st page):  Dr Caleb Popp  Time of first page:  1335  MD notified (2nd page):  Time of second page:  Responding MD:  Dr Caleb Popp  Time MD responded: [phone]

## 2017-02-27 NOTE — Progress Notes (Signed)
Peripherally Inserted Central Catheter/Midline Placement  The IV Nurse has discussed with the patient and/or persons authorized to consent for the patient, the purpose of this procedure and the potential benefits and risks involved with this procedure.  The benefits include less needle sticks, lab draws from the catheter, and the patient may be discharged home with the catheter. Risks include, but not limited to, infection, bleeding, blood clot (thrombus formation), and puncture of an artery; nerve damage and irregular heartbeat and possibility to perform a PICC exchange if needed/ordered by physician.  Alternatives to this procedure were also discussed.  Bard Power PICC patient education guide, fact sheet on infection prevention and patient information card has been provided to patient /or left at bedside.    PICC/Midline Placement Documentation        Lisabeth Devoid 02/27/2017, 8:36 AM Consent obtained from wife via phone by Terie Purser, RN on 02/26/17

## 2017-02-27 NOTE — Progress Notes (Signed)
LCSW following for discharge planning/disposition:  Plan to DC to return to [facility] today.  Patient will transport by [facility]- CSW completed medical necessity form (explained to family there may be cost associated with [facility] transport- family agrees), and CSW will call for transport.  Family notified regarding discharge - Wife Britta Mccreedy at bedside, both pt and wife agreeable.  All information sent to facility by HUB Report number for RN:  [phone].  No other needs at this time   Plan: DC to [facility].   Ilean Skill, MSW, LCSW Clinical Social Work 02/27/2017 [phone]

## 2017-02-27 NOTE — Progress Notes (Signed)
Patient discharged to SNF via [facility]. Patient belongings with patient's wife. Discharge instructions reviewed with patient and his wife. Patient is not in distress.

## 2017-02-27 NOTE — Progress Notes (Signed)
HYDROTHERAPY TREATMENT     02/27/17 1200  Subjective Assessment  Subjective "Im okay"  Patient and Family Stated Goals pt unable-no family present  Prior Treatments unknown  Evaluation and Treatment  Evaluation and Treatment Procedures Explained to Patient/Family Yes  Evaluation and Treatment Procedures Patient unable to consent due to mental status  Pressure Injury 02/22/17 Unstageable - Full thickness tissue loss in which the base of the ulcer is covered by slough (yellow, tan, gray, green or brown) and/or eschar (tan, brown or black) in the wound bed.  Date First Assessed/Time First Assessed: 02/22/17 1345   Location: Sacrum  Staging: Unstageable - Full thickness tissue loss in which the base of the ulcer is covered by slough (yellow, tan, gray, green or brown) and/or eschar (tan, brown or black) in...  Dressing Type ABD;Moist to moist Dakin's soaked Kerlix (day3)  Dressing Change Frequency Twice a day  % Wound base Red or Granulating 30%  % Wound base Yellow/Fibrinous Exudate 35%  % Wound base Black/Eschar 35%  Peri-wound Assessment Maceration;Pink;Excoriated;Black  Drainage Amount Minimal  Drainage Description Odor;Serosanguineous  Treatment Debridement (Selective);Hydrotherapy (Pulse lavage) (Dakin's Solution)  Hydrotherapy  Pulsed lavage therapy - wound location sacrum  Pulsed Lavage with Suction (psi) 8 psi  Pulsed Lavage with Suction - Normal Saline Used 1000 mL  Pulsed Lavage Tip Tip with splash shield  Selective Debridement  Selective Debridement - Location sacrum  Selective Debridement - Tools Used Forceps;Scissors  Selective Debridement - Tissue Removed black necrotic tissue, yellow slough  Wound Therapy - Assess/Plan/Recommendations  Wound Therapy - Clinical Statement 76 yo male admitted with anemia. Hx of dementia. Mostly bedbound. Bedside debridement by surgery 02/22/17  Wound Therapy - Functional Problem List bedbound  Factors Delaying/Impairing Wound Healing  Infection - systemic/local;Incontinence;Immobility  Hydrotherapy Plan Debridement;Pulsatile lavage with suction;Patient/family education;Dressing change  Wound Therapy - Frequency 6X / week  Wound Therapy - Current Recommendations Case manager/social work  Wound Therapy - Follow Up Recommendations Skilled nursing facility  Wound Plan Hydrotherapy to promote wound healing. Wound slowly improving. Still has odor present but less compared to Saturday.  Wound Therapy Goals - Improve the function of patient's integumentary system by progressing the wound(s) through the phases of wound healing by:  Decrease Necrotic Tissue to 50  Decrease Necrotic Tissue - Progress Progressing toward goal  Increase Granulation Tissue to 50  Increase Granulation Tissue - Progress Progressing toward goal  Goals/treatment plan/discharge plan were made with and agreed upon by patient/family No, Patient unable to participate in goals/treatment/discharge plan and family unavailable  Time For Goal Achievement 2 weeks  Wound Therapy - Potential for Goals Bolivar Haw, MPT [phone]

## 2017-02-28 DIAGNOSIS — L89613 Pressure ulcer of right heel, stage 3: Secondary | ICD-10-CM | POA: Diagnosis not present

## 2017-02-28 DIAGNOSIS — L89154 Pressure ulcer of sacral region, stage 4: Secondary | ICD-10-CM | POA: Diagnosis not present

## 2017-03-04 DIAGNOSIS — F0390 Unspecified dementia without behavioral disturbance: Secondary | ICD-10-CM | POA: Diagnosis not present

## 2017-03-04 DIAGNOSIS — R609 Edema, unspecified: Secondary | ICD-10-CM | POA: Diagnosis not present

## 2017-03-04 DIAGNOSIS — M4628 Osteomyelitis of vertebra, sacral and sacrococcygeal region: Secondary | ICD-10-CM | POA: Diagnosis not present

## 2017-03-05 IMAGING — DX DG CHEST 2V
2 series · 2 of 2 positions shown · non-contrast
Comparison: Chest radiograph 01/30/2013

CLINICAL DATA: Palpitations and altered mental status

EXAM:
CHEST  2 VIEW

[chest pa]
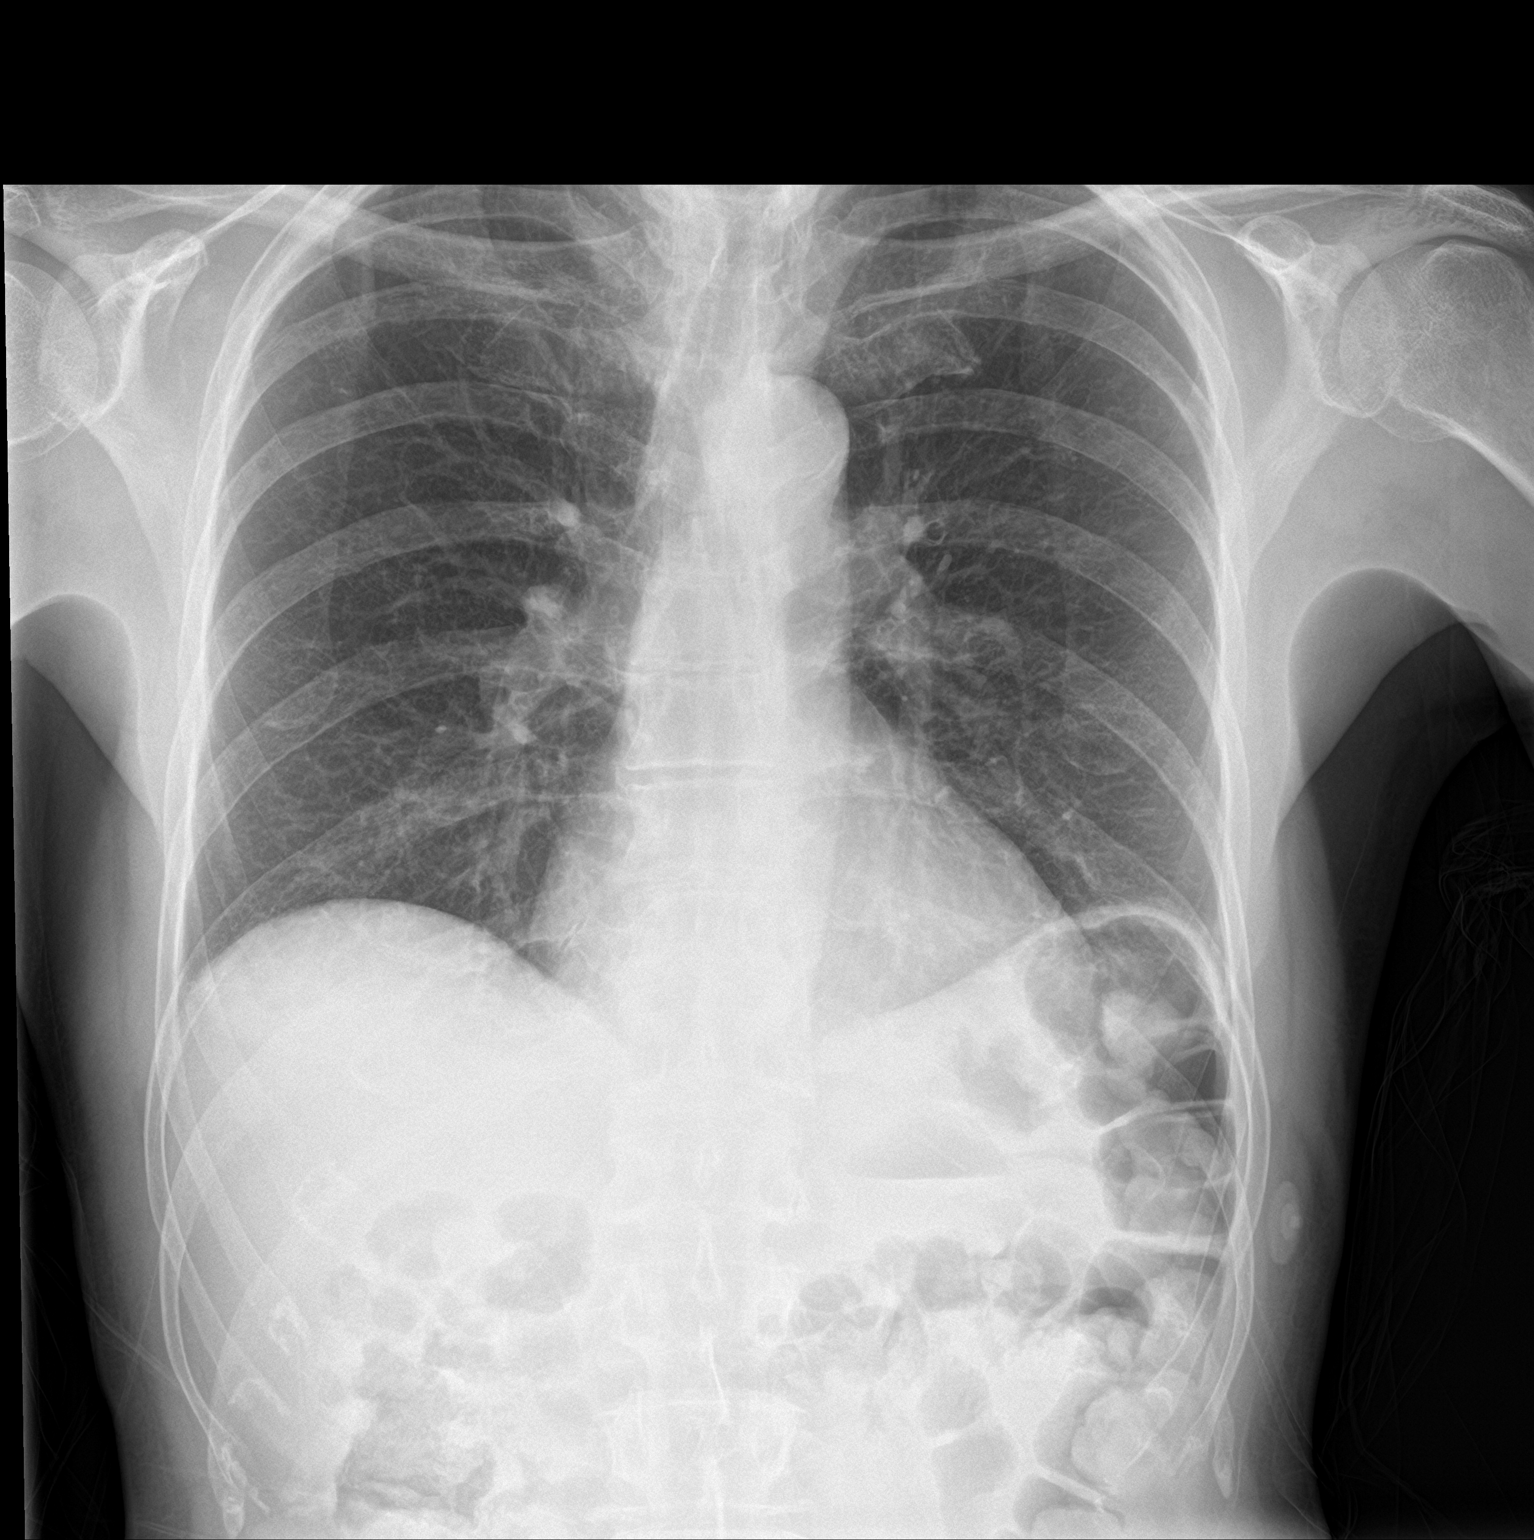

[chest lat]
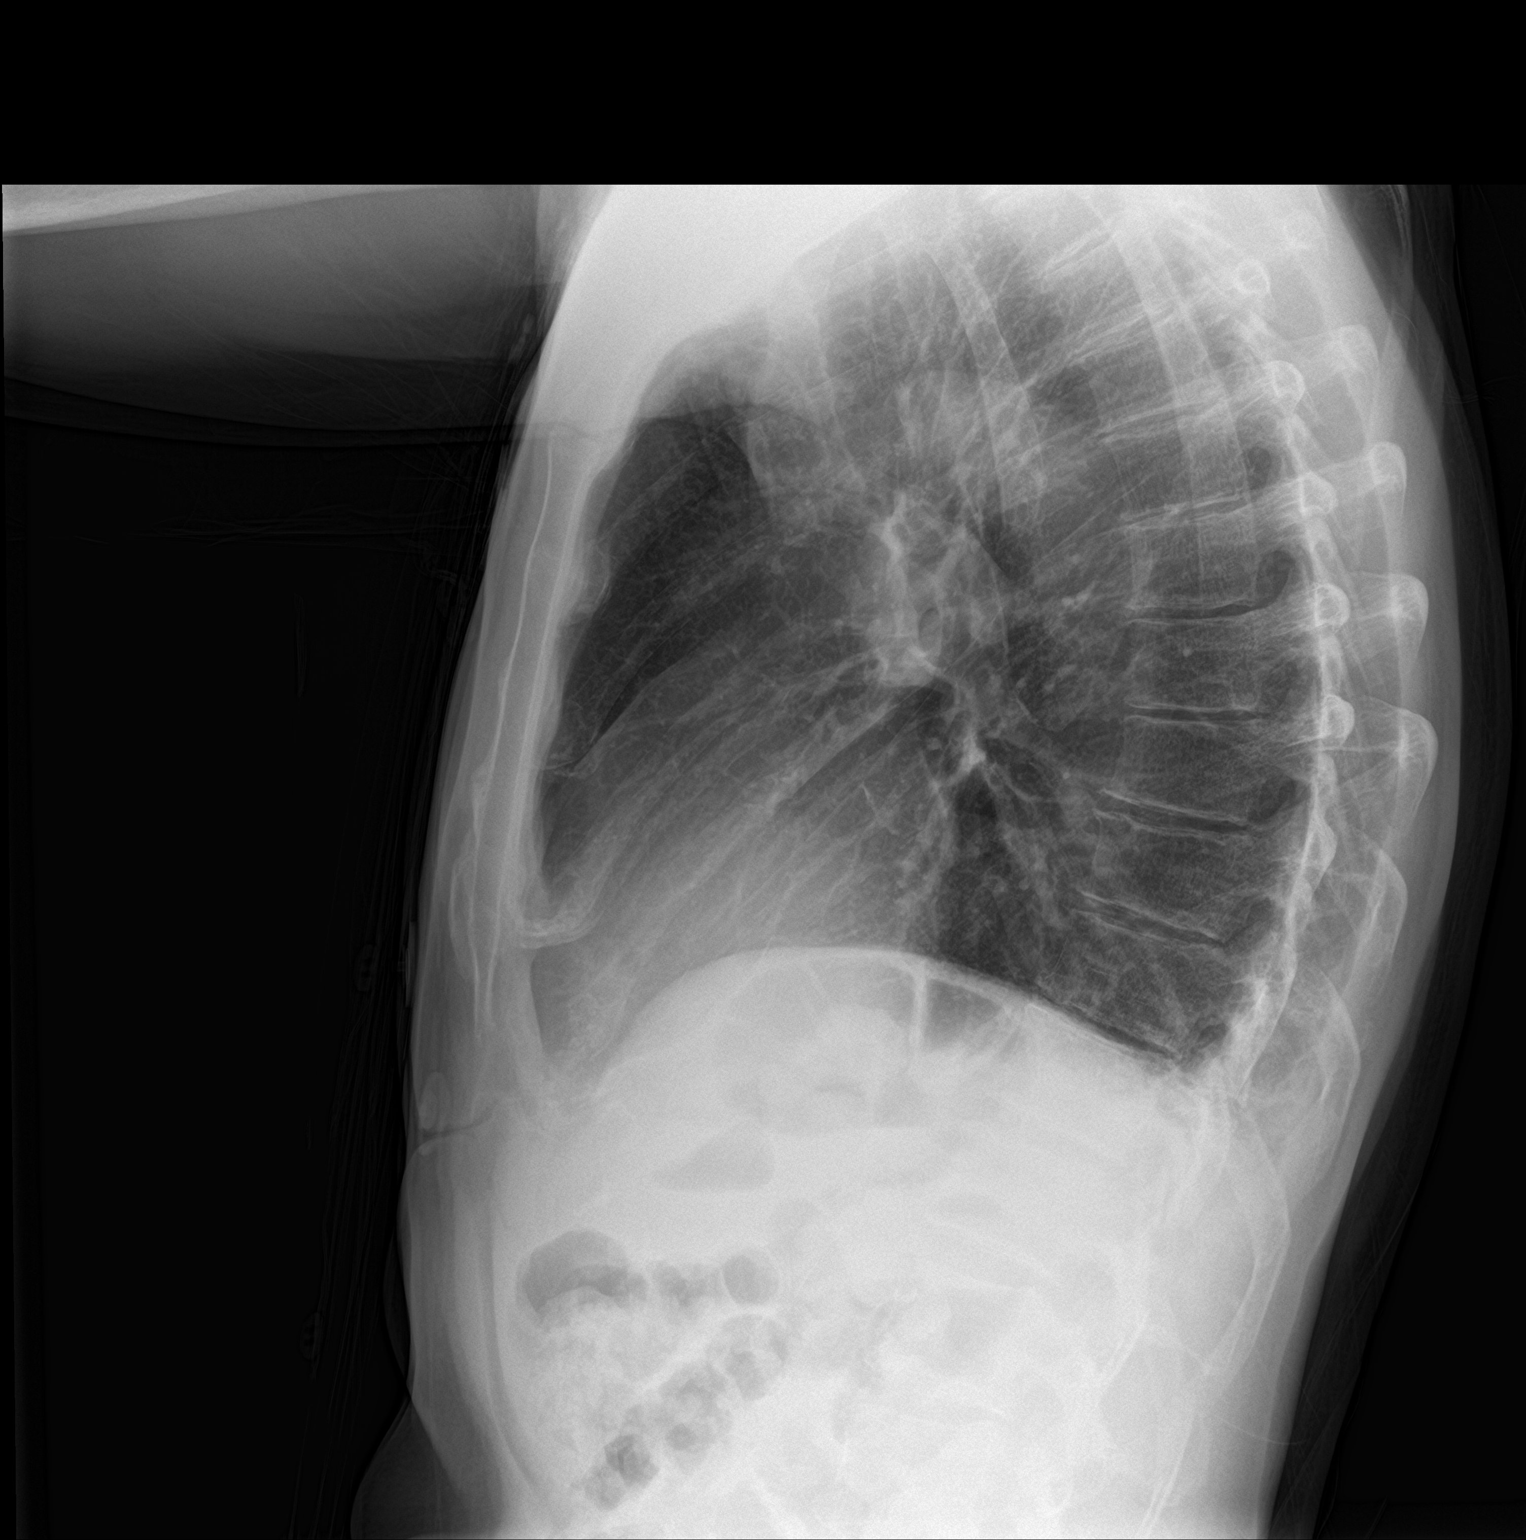

[2 of 2 positions shown; findings below may reference images not displayed]

FINDINGS: The lungs are well inflated. Cardiomediastinal contours are normal.
No pneumothorax or pleural effusion. No focal airspace consolidation
or pulmonary edema.
IMPRESSION: Clear lungs.

## 2017-03-06 DIAGNOSIS — R112 Nausea with vomiting, unspecified: Secondary | ICD-10-CM | POA: Diagnosis not present

## 2017-03-06 DIAGNOSIS — R609 Edema, unspecified: Secondary | ICD-10-CM | POA: Diagnosis not present

## 2017-03-06 DIAGNOSIS — R509 Fever, unspecified: Secondary | ICD-10-CM | POA: Diagnosis not present

## 2017-03-08 DIAGNOSIS — L89154 Pressure ulcer of sacral region, stage 4: Secondary | ICD-10-CM | POA: Diagnosis not present

## 2017-03-08 DIAGNOSIS — L89613 Pressure ulcer of right heel, stage 3: Secondary | ICD-10-CM | POA: Diagnosis not present

## 2017-03-12 DIAGNOSIS — R531 Weakness: Secondary | ICD-10-CM | POA: Diagnosis not present

## 2017-03-14 DIAGNOSIS — L89154 Pressure ulcer of sacral region, stage 4: Secondary | ICD-10-CM | POA: Diagnosis not present

## 2017-03-14 DIAGNOSIS — L89613 Pressure ulcer of right heel, stage 3: Secondary | ICD-10-CM | POA: Diagnosis not present

## 2017-03-20 DIAGNOSIS — D649 Anemia, unspecified: Secondary | ICD-10-CM | POA: Diagnosis not present

## 2017-03-20 DIAGNOSIS — R609 Edema, unspecified: Secondary | ICD-10-CM | POA: Diagnosis not present

## 2017-03-20 DIAGNOSIS — M4628 Osteomyelitis of vertebra, sacral and sacrococcygeal region: Secondary | ICD-10-CM | POA: Diagnosis not present

## 2017-03-20 DIAGNOSIS — K922 Gastrointestinal hemorrhage, unspecified: Secondary | ICD-10-CM | POA: Diagnosis not present

## 2017-03-21 DIAGNOSIS — L89154 Pressure ulcer of sacral region, stage 4: Secondary | ICD-10-CM | POA: Diagnosis not present

## 2017-03-21 DIAGNOSIS — L97519 Non-pressure chronic ulcer of other part of right foot with unspecified severity: Secondary | ICD-10-CM | POA: Diagnosis not present

## 2017-03-22 DIAGNOSIS — F329 Major depressive disorder, single episode, unspecified: Secondary | ICD-10-CM | POA: Diagnosis not present

## 2017-03-22 DIAGNOSIS — R609 Edema, unspecified: Secondary | ICD-10-CM | POA: Diagnosis not present

## 2017-03-22 DIAGNOSIS — F0390 Unspecified dementia without behavioral disturbance: Secondary | ICD-10-CM | POA: Diagnosis not present

## 2017-03-22 DIAGNOSIS — R5383 Other fatigue: Secondary | ICD-10-CM | POA: Diagnosis not present

## 2017-03-26 DIAGNOSIS — F0390 Unspecified dementia without behavioral disturbance: Secondary | ICD-10-CM | POA: Diagnosis not present

## 2017-03-26 DIAGNOSIS — R41840 Attention and concentration deficit: Secondary | ICD-10-CM | POA: Diagnosis not present

## 2017-03-26 DIAGNOSIS — F329 Major depressive disorder, single episode, unspecified: Secondary | ICD-10-CM | POA: Diagnosis not present

## 2017-03-26 DIAGNOSIS — F29 Unspecified psychosis not due to a substance or known physiological condition: Secondary | ICD-10-CM | POA: Diagnosis not present

## 2017-03-28 DIAGNOSIS — L89154 Pressure ulcer of sacral region, stage 4: Secondary | ICD-10-CM | POA: Diagnosis not present

## 2017-03-28 DIAGNOSIS — L89613 Pressure ulcer of right heel, stage 3: Secondary | ICD-10-CM | POA: Diagnosis not present

## 2017-03-29 DIAGNOSIS — R5383 Other fatigue: Secondary | ICD-10-CM | POA: Diagnosis not present

## 2017-03-29 DIAGNOSIS — F0390 Unspecified dementia without behavioral disturbance: Secondary | ICD-10-CM | POA: Diagnosis not present

## 2017-03-29 DIAGNOSIS — R229 Localized swelling, mass and lump, unspecified: Secondary | ICD-10-CM | POA: Diagnosis not present

## 2017-03-29 DIAGNOSIS — R627 Adult failure to thrive: Secondary | ICD-10-CM | POA: Diagnosis not present

## 2017-04-04 DIAGNOSIS — L89154 Pressure ulcer of sacral region, stage 4: Secondary | ICD-10-CM | POA: Diagnosis not present

## 2017-04-04 DIAGNOSIS — L89613 Pressure ulcer of right heel, stage 3: Secondary | ICD-10-CM | POA: Diagnosis not present

## 2017-04-05 DIAGNOSIS — F0390 Unspecified dementia without behavioral disturbance: Secondary | ICD-10-CM | POA: Diagnosis not present

## 2017-04-05 DIAGNOSIS — R627 Adult failure to thrive: Secondary | ICD-10-CM | POA: Diagnosis not present

## 2017-04-05 DIAGNOSIS — R609 Edema, unspecified: Secondary | ICD-10-CM | POA: Diagnosis not present

## 2017-04-11 DIAGNOSIS — L89154 Pressure ulcer of sacral region, stage 4: Secondary | ICD-10-CM | POA: Diagnosis not present

## 2017-04-11 DIAGNOSIS — L89613 Pressure ulcer of right heel, stage 3: Secondary | ICD-10-CM | POA: Diagnosis not present

## 2017-04-11 DIAGNOSIS — L97519 Non-pressure chronic ulcer of other part of right foot with unspecified severity: Secondary | ICD-10-CM | POA: Diagnosis not present

## 2017-04-25 DIAGNOSIS — L89210 Pressure ulcer of right hip, unstageable: Secondary | ICD-10-CM | POA: Diagnosis not present

## 2017-04-25 DIAGNOSIS — L89154 Pressure ulcer of sacral region, stage 4: Secondary | ICD-10-CM | POA: Diagnosis not present

## 2017-04-25 DIAGNOSIS — L89220 Pressure ulcer of left hip, unstageable: Secondary | ICD-10-CM | POA: Diagnosis not present

## 2017-05-02 DIAGNOSIS — L89613 Pressure ulcer of right heel, stage 3: Secondary | ICD-10-CM | POA: Diagnosis not present

## 2017-05-02 DIAGNOSIS — R05 Cough: Secondary | ICD-10-CM | POA: Diagnosis not present

## 2017-05-02 DIAGNOSIS — R5383 Other fatigue: Secondary | ICD-10-CM | POA: Diagnosis not present

## 2017-05-02 DIAGNOSIS — L89224 Pressure ulcer of left hip, stage 4: Secondary | ICD-10-CM | POA: Diagnosis not present

## 2017-05-02 DIAGNOSIS — R0989 Other specified symptoms and signs involving the circulatory and respiratory systems: Secondary | ICD-10-CM | POA: Diagnosis not present

## 2017-05-02 DIAGNOSIS — L89143 Pressure ulcer of left lower back, stage 3: Secondary | ICD-10-CM | POA: Diagnosis not present

## 2017-05-05 ENCOUNTER — Emergency Department (HOSPITAL_COMMUNITY): Payer: Medicare Other

## 2017-05-05 ENCOUNTER — Observation Stay (HOSPITAL_COMMUNITY): Payer: Medicare Other

## 2017-05-05 ENCOUNTER — Encounter (HOSPITAL_COMMUNITY): Payer: Self-pay | Admitting: *Deleted

## 2017-05-05 ENCOUNTER — Emergency Department (HOSPITAL_BASED_OUTPATIENT_CLINIC_OR_DEPARTMENT_OTHER)
Admit: 2017-05-05 | Discharge: 2017-05-05 | Disposition: A | Discharge status: Home / Self Care | Payer: Medicare Other | Attending: Emergency Medicine | Admitting: Emergency Medicine

## 2017-05-05 ENCOUNTER — Inpatient Hospital Stay (HOSPITAL_COMMUNITY)
Admission: EM | Admit: 2017-05-05 | Discharge: 2017-05-08 | DRG: 070 | Disposition: A | Discharge status: Skilled Nursing Facility | Payer: Medicare Other | Attending: Internal Medicine | Admitting: Internal Medicine

## 2017-05-05 DIAGNOSIS — Z681 Body mass index (BMI) 19 or less, adult: Secondary | ICD-10-CM

## 2017-05-05 DIAGNOSIS — M4628 Osteomyelitis of vertebra, sacral and sacrococcygeal region: Secondary | ICD-10-CM | POA: Diagnosis present

## 2017-05-05 DIAGNOSIS — R4182 Altered mental status, unspecified: Secondary | ICD-10-CM | POA: Diagnosis not present

## 2017-05-05 DIAGNOSIS — A419 Sepsis, unspecified organism: Secondary | ICD-10-CM | POA: Diagnosis not present

## 2017-05-05 DIAGNOSIS — E785 Hyperlipidemia, unspecified: Secondary | ICD-10-CM | POA: Diagnosis present

## 2017-05-05 DIAGNOSIS — E876 Hypokalemia: Secondary | ICD-10-CM | POA: Diagnosis present

## 2017-05-05 DIAGNOSIS — F0390 Unspecified dementia without behavioral disturbance: Secondary | ICD-10-CM | POA: Diagnosis present

## 2017-05-05 DIAGNOSIS — Z79899 Other long term (current) drug therapy: Secondary | ICD-10-CM

## 2017-05-05 DIAGNOSIS — Z66 Do not resuscitate: Secondary | ICD-10-CM | POA: Diagnosis present

## 2017-05-05 DIAGNOSIS — L89613 Pressure ulcer of right heel, stage 3: Secondary | ICD-10-CM | POA: Diagnosis present

## 2017-05-05 DIAGNOSIS — G9341 Metabolic encephalopathy: Principal | ICD-10-CM | POA: Diagnosis present

## 2017-05-05 DIAGNOSIS — I10 Essential (primary) hypertension: Secondary | ICD-10-CM | POA: Diagnosis present

## 2017-05-05 DIAGNOSIS — Z993 Dependence on wheelchair: Secondary | ICD-10-CM

## 2017-05-05 DIAGNOSIS — Z87891 Personal history of nicotine dependence: Secondary | ICD-10-CM

## 2017-05-05 DIAGNOSIS — Z515 Encounter for palliative care: Secondary | ICD-10-CM | POA: Diagnosis present

## 2017-05-05 DIAGNOSIS — I82409 Acute embolism and thrombosis of unspecified deep veins of unspecified lower extremity: Secondary | ICD-10-CM | POA: Diagnosis present

## 2017-05-05 DIAGNOSIS — M79609 Pain in unspecified limb: Secondary | ICD-10-CM | POA: Diagnosis not present

## 2017-05-05 DIAGNOSIS — N400 Enlarged prostate without lower urinary tract symptoms: Secondary | ICD-10-CM | POA: Diagnosis present

## 2017-05-05 DIAGNOSIS — E860 Dehydration: Secondary | ICD-10-CM | POA: Diagnosis present

## 2017-05-05 DIAGNOSIS — J45909 Unspecified asthma, uncomplicated: Secondary | ICD-10-CM | POA: Diagnosis present

## 2017-05-05 DIAGNOSIS — D649 Anemia, unspecified: Secondary | ICD-10-CM | POA: Diagnosis present

## 2017-05-05 DIAGNOSIS — I82621 Acute embolism and thrombosis of deep veins of right upper extremity: Secondary | ICD-10-CM | POA: Diagnosis not present

## 2017-05-05 DIAGNOSIS — E870 Hyperosmolality and hypernatremia: Secondary | ICD-10-CM | POA: Diagnosis present

## 2017-05-05 DIAGNOSIS — M7989 Other specified soft tissue disorders: Secondary | ICD-10-CM

## 2017-05-05 DIAGNOSIS — Z7189 Other specified counseling: Secondary | ICD-10-CM

## 2017-05-05 DIAGNOSIS — L89154 Pressure ulcer of sacral region, stage 4: Secondary | ICD-10-CM | POA: Diagnosis present

## 2017-05-05 DIAGNOSIS — G9340 Encephalopathy, unspecified: Secondary | ICD-10-CM | POA: Diagnosis present

## 2017-05-05 DIAGNOSIS — E440 Moderate protein-calorie malnutrition: Secondary | ICD-10-CM | POA: Diagnosis present

## 2017-05-05 DIAGNOSIS — R627 Adult failure to thrive: Secondary | ICD-10-CM | POA: Diagnosis present

## 2017-05-05 LAB — CBC WITH DIFFERENTIAL/PLATELET
BASOS PCT: 0 %
Basophils Absolute: 0 10*3/uL (ref 0.0–0.1)
EOS ABS: 0 10*3/uL (ref 0.0–0.7)
EOS PCT: 0 %
HCT: 27.1 % — ABNORMAL LOW (ref 39.0–52.0)
Hemoglobin: 8 g/dL — ABNORMAL LOW (ref 13.0–17.0)
LYMPHS ABS: 0.9 10*3/uL (ref 0.7–4.0)
Lymphocytes Relative: 10 %
MCH: 25.9 pg — ABNORMAL LOW (ref 26.0–34.0)
MCHC: 29.5 g/dL — AB (ref 30.0–36.0)
MCV: 87.7 fL (ref 78.0–100.0)
MONO ABS: 0.2 10*3/uL (ref 0.1–1.0)
MONOS PCT: 2 %
NEUTROS PCT: 88 %
Neutro Abs: 7.3 10*3/uL (ref 1.7–7.7)
Platelets: 327 10*3/uL (ref 150–400)
RBC: 3.09 MIL/uL — ABNORMAL LOW (ref 4.22–5.81)
RDW: 16.7 % — AB (ref 11.5–15.5)
WBC: 8.4 10*3/uL (ref 4.0–10.5)

## 2017-05-05 LAB — CBC
HEMATOCRIT: 30.1 % — AB (ref 39.0–52.0)
Hemoglobin: 9 g/dL — ABNORMAL LOW (ref 13.0–17.0)
MCH: 26.2 pg (ref 26.0–34.0)
MCHC: 29.9 g/dL — ABNORMAL LOW (ref 30.0–36.0)
MCV: 87.8 fL (ref 78.0–100.0)
PLATELETS: 323 10*3/uL (ref 150–400)
RBC: 3.43 MIL/uL — ABNORMAL LOW (ref 4.22–5.81)
RDW: 16.7 % — AB (ref 11.5–15.5)
WBC: 8 10*3/uL (ref 4.0–10.5)

## 2017-05-05 LAB — BASIC METABOLIC PANEL
ANION GAP: 12 (ref 5–15)
ANION GAP: 9 (ref 5–15)
BUN: 25 mg/dL — AB (ref 6–20)
BUN: 24 mg/dL — AB (ref 6–20)
CHLORIDE: 120 mmol/L — AB (ref 101–111)
CHLORIDE: 118 mmol/L — AB (ref 101–111)
CO2: 25 mmol/L (ref 22–32)
CO2: 29 mmol/L (ref 22–32)
Calcium: 7.4 mg/dL — ABNORMAL LOW (ref 8.9–10.3)
Calcium: 7.6 mg/dL — ABNORMAL LOW (ref 8.9–10.3)
Creatinine, Ser: 1 mg/dL (ref 0.61–1.24)
Creatinine, Ser: 0.98 mg/dL (ref 0.61–1.24)
GFR calc Af Amer: >60 mL/min (ref >60)
GFR calc Af Amer: >60 mL/min (ref >60)
GFR calc non Af Amer: >60 mL/min (ref >60)
GLUCOSE: 93 mg/dL (ref 65–99)
GLUCOSE: 127 mg/dL — AB (ref 65–99)
POTASSIUM: 3.5 mmol/L (ref 3.5–5.1)
Potassium: 3.8 mmol/L (ref 3.5–5.1)
Sodium: 157 mmol/L — ABNORMAL HIGH (ref 135–145)
Sodium: 156 mmol/L — ABNORMAL HIGH (ref 135–145)

## 2017-05-05 LAB — COMPREHENSIVE METABOLIC PANEL
ALBUMIN: 1.9 g/dL — AB (ref 3.5–5.0)
ALK PHOS: 114 U/L (ref 38–126)
ALT: 76 U/L — AB (ref 17–63)
ANION GAP: 11 (ref 5–15)
AST: 118 U/L — ABNORMAL HIGH (ref 15–41)
BILIRUBIN TOTAL: 0.4 mg/dL (ref 0.3–1.2)
BUN: 27 mg/dL — AB (ref 6–20)
CALCIUM: 7.6 mg/dL — AB (ref 8.9–10.3)
CO2: 29 mmol/L (ref 22–32)
CREATININE: 1.07 mg/dL (ref 0.61–1.24)
Chloride: 118 mmol/L — ABNORMAL HIGH (ref 101–111)
GFR calc Af Amer: >60 mL/min (ref >60)
GFR calc non Af Amer: >60 mL/min (ref >60)
GLUCOSE: 113 mg/dL — AB (ref 65–99)
Potassium: 3.1 mmol/L — ABNORMAL LOW (ref 3.5–5.1)
Sodium: 158 mmol/L — ABNORMAL HIGH (ref 135–145)
TOTAL PROTEIN: 6.3 g/dL — AB (ref 6.5–8.1)

## 2017-05-05 LAB — PROTIME-INR
INR: 1.46
PROTHROMBIN TIME: 17.8 s — AB (ref 11.4–15.2)

## 2017-05-05 LAB — URINALYSIS, ROUTINE W REFLEX MICROSCOPIC
BILIRUBIN URINE: NEGATIVE
Glucose, UA: NEGATIVE mg/dL
HGB URINE DIPSTICK: NEGATIVE
KETONES UR: NEGATIVE mg/dL
Leukocytes, UA: NEGATIVE
NITRITE: NEGATIVE
PH: 5 (ref 5.0–8.0)
Protein, ur: NEGATIVE mg/dL
SPECIFIC GRAVITY, URINE: 1.021 (ref 1.005–1.030)

## 2017-05-05 LAB — I-STAT CG4 LACTIC ACID, ED: Lactic Acid, Venous: 1.94 mmol/L — ABNORMAL HIGH (ref 0.5–1.9)

## 2017-05-05 LAB — PHOSPHORUS: PHOSPHORUS: 3.5 mg/dL (ref 2.5–4.6)

## 2017-05-05 LAB — HEPARIN LEVEL (UNFRACTIONATED): HEPARIN UNFRACTIONATED: 0.25 [IU]/mL — AB (ref 0.30–0.70)

## 2017-05-05 LAB — VITAMIN B12: Vitamin B-12: 2099 pg/mL — ABNORMAL HIGH (ref 180–914)

## 2017-05-05 LAB — MAGNESIUM: MAGNESIUM: 2.4 mg/dL (ref 1.7–2.4)

## 2017-05-05 LAB — LACTIC ACID, PLASMA: Lactic Acid, Venous: 2.6 mmol/L (ref 0.5–1.9)

## 2017-05-05 MED ORDER — ONDANSETRON HCL 4 MG PO TABS: 4.0000 mg | ORAL_TABLET | Freq: Four times a day (QID) | ORAL | Status: DC | PRN

## 2017-05-05 MED ORDER — POTASSIUM CHLORIDE 10 MEQ/100ML IV SOLN
10.0000 meq | INTRAVENOUS | Status: AC
  Administered 2017-05-05 (×3): 10 meq via INTRAVENOUS
  Filled 2017-05-05 (×3): qty 100

## 2017-05-05 MED ORDER — SODIUM CHLORIDE 0.9 % IV BOLUS (SEPSIS)
250.0000 mL | Freq: Once | INTRAVENOUS | Status: AC
  Administered 2017-05-05: 250 mL via INTRAVENOUS

## 2017-05-05 MED ORDER — VITAMIN B-12 1000 MCG PO TABS
500.0000 ug | ORAL_TABLET | Freq: Every day | ORAL | Status: DC
  Administered 2017-05-06: 500 ug via ORAL
  Filled 2017-05-05 (×2): qty 1

## 2017-05-05 MED ORDER — HEPARIN (PORCINE) IN NACL 100-0.45 UNIT/ML-% IJ SOLN
1100.0000 [IU]/h | INTRAMUSCULAR | Status: DC
  Administered 2017-05-05: 1100 [IU]/h via INTRAVENOUS
  Filled 2017-05-05: qty 250

## 2017-05-05 MED ORDER — TAMSULOSIN HCL 0.4 MG PO CAPS
0.4000 mg | ORAL_CAPSULE | Freq: Every day | ORAL | Status: DC
  Administered 2017-05-05: 0.4 mg via ORAL
  Filled 2017-05-05 (×2): qty 1

## 2017-05-05 MED ORDER — UNJURY CHICKEN SOUP POWDER
2.0000 [oz_av] | Freq: Four times a day (QID) | ORAL | Status: DC
  Administered 2017-05-05 – 2017-05-06 (×2): 2 [oz_av] via ORAL
  Filled 2017-05-05 (×10): qty 27

## 2017-05-05 MED ORDER — DONEPEZIL HCL 10 MG PO TABS
10.0000 mg | ORAL_TABLET | Freq: Every day | ORAL | Status: DC
  Administered 2017-05-05: 10 mg via ORAL
  Filled 2017-05-05 (×2): qty 1

## 2017-05-05 MED ORDER — FINASTERIDE 5 MG PO TABS
5.0000 mg | ORAL_TABLET | Freq: Every day | ORAL | Status: DC
  Administered 2017-05-05 – 2017-05-06 (×2): 5 mg via ORAL
  Filled 2017-05-05 (×2): qty 1

## 2017-05-05 MED ORDER — PIPERACILLIN-TAZOBACTAM 3.375 G IVPB 30 MIN
3.3750 g | Freq: Once | INTRAVENOUS | Status: AC
  Administered 2017-05-05: 3.375 g via INTRAVENOUS
  Filled 2017-05-05: qty 50

## 2017-05-05 MED ORDER — DEXTROSE 10 % IV SOLN
INTRAVENOUS | Status: DC
  Administered 2017-05-05 – 2017-05-07 (×3): via INTRAVENOUS

## 2017-05-05 MED ORDER — ONDANSETRON HCL 4 MG/2ML IJ SOLN
4.0000 mg | Freq: Four times a day (QID) | INTRAMUSCULAR | Status: DC | PRN
Start: 2017-05-05 — End: 2017-05-08

## 2017-05-05 MED ORDER — COLLAGENASE 250 UNIT/GM EX OINT
1.0000 "application " | TOPICAL_OINTMENT | Freq: Every day | CUTANEOUS | Status: DC
  Administered 2017-05-06 – 2017-05-07 (×2): 1 via TOPICAL
  Filled 2017-05-05 (×2): qty 30

## 2017-05-05 MED ORDER — SODIUM CHLORIDE 0.45 % IV SOLN
INTRAVENOUS | Status: DC
  Administered 2017-05-05: 18:00:00 via INTRAVENOUS

## 2017-05-05 MED ORDER — VANCOMYCIN HCL IN DEXTROSE 1-5 GM/200ML-% IV SOLN
1000.0000 mg | Freq: Once | INTRAVENOUS | Status: AC
  Administered 2017-05-05: 1000 mg via INTRAVENOUS
  Filled 2017-05-05: qty 200

## 2017-05-05 MED ORDER — MEMANTINE HCL 10 MG PO TABS
10.0000 mg | ORAL_TABLET | Freq: Two times a day (BID) | ORAL | Status: DC
  Administered 2017-05-05 – 2017-05-06 (×2): 10 mg via ORAL
  Filled 2017-05-05 (×7): qty 1

## 2017-05-05 MED ORDER — SERTRALINE HCL 25 MG PO TABS
25.0000 mg | ORAL_TABLET | Freq: Every day | ORAL | Status: DC
  Administered 2017-05-06: 25 mg via ORAL
  Filled 2017-05-05 (×3): qty 1

## 2017-05-05 MED ORDER — SODIUM CHLORIDE 0.9 % IV BOLUS (SEPSIS)
1000.0000 mL | Freq: Once | INTRAVENOUS | Status: AC
  Administered 2017-05-05: 1000 mL via INTRAVENOUS

## 2017-05-05 MED ORDER — ENSURE ENLIVE PO LIQD
237.0000 mL | Freq: Two times a day (BID) | ORAL | Status: DC
  Administered 2017-05-06: 237 mL via ORAL

## 2017-05-05 MED ORDER — RISPERIDONE 1 MG PO TABS: 1.0000 mg | ORAL_TABLET | Freq: Two times a day (BID) | ORAL | Status: DC

## 2017-05-05 MED ORDER — ACETAMINOPHEN 650 MG RE SUPP: 650.0000 mg | Freq: Four times a day (QID) | RECTAL | Status: DC | PRN

## 2017-05-05 MED ORDER — FLUTICASONE PROPIONATE 50 MCG/ACT NA SUSP
2.0000 | Freq: Every day | NASAL | Status: DC
  Administered 2017-05-06 – 2017-05-08 (×3): 2 via NASAL
  Filled 2017-05-05: qty 16

## 2017-05-05 MED ORDER — SODIUM CHLORIDE 0.9% FLUSH
3.0000 mL | Freq: Two times a day (BID) | INTRAVENOUS | Status: DC
  Administered 2017-05-06: 3 mL via INTRAVENOUS

## 2017-05-05 MED ORDER — HEPARIN BOLUS VIA INFUSION
4000.0000 [IU] | Freq: Once | INTRAVENOUS | Status: AC
  Administered 2017-05-05: 4000 [IU] via INTRAVENOUS
  Filled 2017-05-05: qty 4000

## 2017-05-05 MED ORDER — SODIUM CHLORIDE 0.9 % IV BOLUS (SEPSIS)
500.0000 mL | Freq: Once | INTRAVENOUS | Status: AC
  Administered 2017-05-05: 500 mL via INTRAVENOUS

## 2017-05-05 MED ORDER — ACETAMINOPHEN 325 MG PO TABS: 650.0000 mg | ORAL_TABLET | Freq: Four times a day (QID) | ORAL | Status: DC | PRN

## 2017-05-05 MED ORDER — PIPERACILLIN-TAZOBACTAM 3.375 G IVPB
3.3750 g | Freq: Three times a day (TID) | INTRAVENOUS | Status: DC
  Administered 2017-05-05 – 2017-05-07 (×6): 3.375 g via INTRAVENOUS
  Filled 2017-05-05 (×7): qty 50

## 2017-05-05 MED ORDER — PANTOPRAZOLE SODIUM 40 MG PO TBEC
40.0000 mg | DELAYED_RELEASE_TABLET | Freq: Two times a day (BID) | ORAL | Status: DC
Start: 2017-05-05 — End: 2017-05-08
  Administered 2017-05-06 (×2): 40 mg via ORAL
  Filled 2017-05-05 (×2): qty 1

## 2017-05-05 MED ORDER — HEPARIN (PORCINE) IN NACL 100-0.45 UNIT/ML-% IJ SOLN
1550.0000 [IU]/h | INTRAMUSCULAR | Status: DC
  Administered 2017-05-05: 1300 [IU]/h via INTRAVENOUS
  Administered 2017-05-07: 1450 [IU]/h via INTRAVENOUS
  Filled 2017-05-05 (×2): qty 250

## 2017-05-05 MED ORDER — MONTELUKAST SODIUM 10 MG PO TABS
10.0000 mg | ORAL_TABLET | Freq: Every day | ORAL | Status: DC
Start: 2017-05-05 — End: 2017-05-08
  Administered 2017-05-05: 10 mg via ORAL
  Filled 2017-05-05 (×2): qty 1

## 2017-05-05 MED ORDER — ADULT MULTIVITAMIN W/MINERALS CH
1.0000 | ORAL_TABLET | Freq: Every day | ORAL | Status: DC
  Administered 2017-05-06: 1 via ORAL
  Filled 2017-05-05: qty 1

## 2017-05-05 MED ORDER — OMEGA-3-ACID ETHYL ESTERS 1 G PO CAPS
1.0000 g | ORAL_CAPSULE | Freq: Every day | ORAL | Status: DC
  Administered 2017-05-06: 1 g via ORAL
  Filled 2017-05-05: qty 1

## 2017-05-05 MED ORDER — DICLOFENAC SODIUM 1 % TD GEL: 1.0000 "application " | Freq: Every day | TRANSDERMAL | Status: DC | PRN

## 2017-05-05 NOTE — ED Provider Notes (Signed)
[facility]-EMERGENCY DEPT Provider Note   CSN: [id number] Arrival date & time: 05/05/17  1324     History   Chief Complaint Chief Complaint  Patient presents with  . Code Sepsis    HPI Andre Ball is a 76 y.o. male.  HPI  Level V Caveat, dementia and AMS  PICC line placed 7/5 for vancomycin treatment of osteomyelitis  Right arm swelling, fever EMS 90/40 blood pressure, received 1000cc NS Afebrile with EMS and here on arrival Facility had reported hx of dementia, however has had altered mental status, normally able to carry on conversation, hover has not been talking or eating much last few days per EMS history  Further history obtained from facility, facility reporting he had no acute change in mental status, temperature 99.5 at facility.  Right arm swelling major reason for admission.     Past Medical History:  Diagnosis Date  . Arthritis   . Asthma   . BPH (benign prostatic hyperplasia)   . Memory deficits 11/01/2014    Patient Active Problem List   Diagnosis Date Noted  . Acute encephalopathy 05/05/2017  . DVT (deep venous thrombosis) (HCC) 05/05/2017  . Hypokalemia 05/05/2017  . Osteomyelitis of vertebra, sacral and sacrococcygeal region (HCC) 02/22/2017  . Acute blood loss anemia 02/20/2017  . Sacral decubitus ulcer, stage IV (HCC) 02/20/2017  . Fecal occult blood test positive   . Symptomatic anemia   . Rhabdomyolysis 02/01/2017  . Malnutrition of moderate degree 02/01/2017  . Pressure injury of skin 02/01/2017  . Dehydration 01/31/2017  . Hypernatremia 01/31/2017  . General weakness 01/31/2017  . Essential hypertension 01/31/2017  . Dementia 01/31/2017  . Memory deficits 11/01/2014    Past Surgical History:  Procedure Laterality Date  . TONSILLECTOMY         Home Medications    Prior to Admission medications   Medication Sig Start Date End Date Taking? Authorizing Provider  acetaminophen (TYLENOL) 325 MG tablet Take 650 mg by mouth every 4  (four) hours as needed for mild pain or fever.   Yes [provider]  Amino Acids-Protein Hydrolys (FEEDING SUPPLEMENT, PRO-STAT SUGAR FREE 64,) LIQD Take 30 mLs by mouth 2 (two) times daily.   Yes [provider]  ceFEPime (MAXIPIME) IVPB Inject 1 g into the vein 3 (three) times daily. For 2 weeks 05/03/17  Yes [provider]  donepezil (ARICEPT) 10 MG tablet Take 1 tablet (10 mg total) by mouth at bedtime. 08/20/16  Yes York Spaniel, MD  ENSURE PLUS (ENSURE PLUS) LIQD Take 237 mLs by mouth 2 (two) times daily between meals.   Yes [provider]  finasteride (PROSCAR) 5 MG tablet Take 5 mg by mouth daily.   Yes [provider]  fluticasone (FLONASE) 50 MCG/ACT nasal spray Place 2 sprays into the nose daily. 01/30/13  Yes Reuben Likes, MD  furosemide (LASIX) 20 MG tablet Take 60 mg by mouth daily.   Yes [provider]  guaifenesin (ROBITUSSIN CHEST CONGESTION) 100 MG/5ML syrup Take 400 mg by mouth every 8 (eight) hours as needed for cough.   Yes [provider]  memantine (NAMENDA) 10 MG tablet Take 1 tablet (10 mg total) by mouth 2 (two) times daily. 08/20/16  Yes York Spaniel, MD  methylphenidate (RITALIN) 10 MG tablet Take 10 mg by mouth every morning.   Yes [provider]  montelukast (SINGULAIR) 10 MG tablet Take 10 mg by mouth at bedtime.   Yes [provider]  Multiple Vitamin (MULTIVITAMIN WITH MINERALS) TABS tablet Take 1 tablet by mouth daily.   Yes [provider]  omega-3 acid ethyl esters (LOVAZA) 1 g capsule Take 1 g by mouth daily.   Yes [provider]  pantoprazole (PROTONIX) 40 MG tablet Take 1 tablet (40 mg total) by mouth 2 (two) times daily before a meal. Patient taking differently: Take 40 mg by mouth every morning.  02/27/17  Yes Narda Bonds, MD  sertraline (ZOLOFT) 25 MG tablet Take 25 mg by mouth daily.   Yes [provider]  sodium hypochlorite  (DAKIN'S 1/4 STRENGTH) 0.125 % SOLN Irrigate with 1 application as directed See admin instructions. Apply to left hip topically every morning, and evening shift for wound care.   Yes [provider]  tamsulosin (FLOMAX) 0.4 MG CAPS Take 0.4 mg by mouth daily.   Yes [provider]  VANCOMYCIN HCL IV Inject 1 g into the vein every 12 (twelve) hours. For 6 weeks 05/03/17 06/14/17 Yes [provider]  collagenase (SANTYL) ointment Apply 1 application topically daily. Patient taking differently: Apply 1 application topically every evening. Pt applies to right heel and sacrum. 02/04/17   Lenox Ponds, MD  diclofenac sodium (VOLTAREN) 1 % GEL Apply 1 application topically daily as needed (for leg/shoulder pain).     [provider]  feeding supplement, ENSURE ENLIVE, (ENSURE ENLIVE) LIQD Take 237 mLs by mouth 2 (two) times daily between meals. Patient not taking: Reported on 05/05/2017 02/04/17   Randel Pigg, Dorma Russell, MD  nitroGLYCERIN (NITROSTAT) 0.4 MG SL tablet Place 0.4 mg under the tongue every 5 (five) minutes as needed for chest pain.    [provider]  oxyCODONE-acetaminophen (PERCOCET/ROXICET) 5-325 MG tablet Take 1 tablet by mouth every 6 (six) hours as needed for moderate pain. 02/27/17   Narda Bonds, MD  protein supplement (UNJURY CHICKEN SOUP) POWD Take 7 g (2 oz total) by mouth 4 (four) times daily. Patient not taking: Reported on 05/05/2017 02/27/17   Narda Bonds, MD    Family History Family History  Problem Relation Age of Onset  . Alzheimer's disease Mother   . Alzheimer's disease Father   . Diabetes Brother   . Alzheimer's disease Sister   . Heart disease Brother   . Diabetes Brother   . Alzheimer's disease Sister   . Alzheimer's disease Brother   . Alzheimer's disease Brother     Social History Social History  Substance Use Topics  . Smoking status: Former Games developer  . Smokeless tobacco: Never Used  . Alcohol use No     Comment:  occasionally     Allergies   Patient has no known allergies.   Review of Systems Review of Systems  Unable to perform ROS: Dementia     Physical Exam Updated Vital Signs BP 118/89 (BP Location: Right Arm)   Pulse 94   Temp 97.9 F (36.6 C) (Oral)   Resp (!) 22   Wt 72.6 kg (160 lb) Comment: per records at [facility] in May 2018  SpO2 98%   BMI 22.32 kg/m   Physical Exam  Constitutional: He appears well-developed. He appears cachectic. He appears ill. No distress.  HENT:  Head: Normocephalic and atraumatic.  Eyes: Conjunctivae and EOM are normal.  Neck: Normal range of motion.  Cardiovascular: Normal rate, regular rhythm, normal heart sounds and intact distal pulses.  Exam reveals no gallop and no friction rub.   No murmur heard. Pulmonary/Chest: Effort normal and  breath sounds normal. No respiratory distress. He has no wheezes. He has no rales.  Abdominal: Soft. He exhibits no distension. There is no tenderness. There is no guarding.  Musculoskeletal: He exhibits no edema.  Right arm swelling  Neurological: He is alert. GCS eye subscore is 4. GCS verbal subscore is 1. GCS motor subscore is 5.  Skin: Skin is warm and dry. He is not diaphoretic.  Right hip with ulceration, ulcer not able to stage, black center, no surrounding erythema nor fluctuance  Nursing note and vitals reviewed.    ED Treatments / Results  Labs (all labs ordered are listed, but only abnormal results are displayed) Labs Reviewed  COMPREHENSIVE METABOLIC PANEL - Abnormal; Notable for the following:       Result Value   Sodium 158 (*)    Potassium 3.1 (*)    Chloride 118 (*)    Glucose, Bld 113 (*)    BUN 27 (*)    Calcium 7.6 (*)    Total Protein 6.3 (*)    Albumin 1.9 (*)    AST 118 (*)    ALT 76 (*)    All other components within normal limits  CBC WITH DIFFERENTIAL/PLATELET - Abnormal; Notable for the following:    RBC 3.09 (*)    Hemoglobin 8.0 (*)    HCT 27.1 (*)    MCH 25.9 (*)     MCHC 29.5 (*)    RDW 16.7 (*)    All other components within normal limits  PROTIME-INR - Abnormal; Notable for the following:    Prothrombin Time 17.8 (*)    All other components within normal limits  URINALYSIS, ROUTINE W REFLEX MICROSCOPIC - Abnormal; Notable for the following:    APPearance HAZY (*)    All other components within normal limits  BASIC METABOLIC PANEL - Abnormal; Notable for the following:    Sodium 157 (*)    Chloride 120 (*)    BUN 25 (*)    Calcium 7.4 (*)    All other components within normal limits  I-STAT CG4 LACTIC ACID, ED - Abnormal; Notable for the following:    Lactic Acid, Venous 1.94 (*)    All other components within normal limits  CULTURE, BLOOD (ROUTINE X 2)  CULTURE, BLOOD (ROUTINE X 2)  URINE CULTURE  MAGNESIUM  PHOSPHORUS  HEPARIN LEVEL (UNFRACTIONATED)  VITAMIN B12  FOLATE RBC  LACTIC ACID, PLASMA  CBC  VANCOMYCIN, TROUGH  CBC  BASIC METABOLIC PANEL  HEPARIN LEVEL (UNFRACTIONATED)  BASIC METABOLIC PANEL  I-STAT CG4 LACTIC ACID, ED    EKG  EKG Interpretation  Date/Time:  Sunday May 05 2017 09:27:25 EDT Ventricular Rate:  103 PR Interval:    QRS Duration: 90 QT Interval:  337 QTC Calculation: 442 R Axis:   69 Text Interpretation:  Fast sinus arrhythmia Minimal ST depression, inferior leads Since prior ECG, patient with sinus arrythmia , no other significant changes Confirmed by Alvira MondaySchlossman, Stefanos Haynesworth ([id number]) on 05/05/2017 11:07:51 AM       Radiology Ct Head Wo Contrast  Result Date: 05/05/2017 CLINICAL DATA:  Altered mental status, decreased appetite. EXAM: CT HEAD WITHOUT CONTRAST TECHNIQUE: Contiguous axial images were obtained from the base of the skull through the vertex without intravenous contrast. COMPARISON:  CT HEAD January 31, 2017 and MRI of the head December 02, 2014 FINDINGS: BRAIN: Moderate to severe ventriculomegaly, disproportionate bitemporal horn enlargement on ex vacuo basis. Overall commensurate enlargement of cerebral  sulci. Confluent supratentorial and pontine white matter  hypodensities. 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 No intraparenchymal hemorrhage, mass effect, midline shift or acute large vascular territory infarcts. No abnormal extra-axial fluid collections. Basal cisterns are patent. VASCULAR: Mild calcific atherosclerosis of the carotid siphons. SKULL: No skull fracture. No significant scalp soft tissue swelling. SINUSES/ORBITS: Minimal secretions LEFT sphenoid sinus without air-fluid levels. Pneumatized petrous apices. The included ocular globes and orbital contents are non-suspicious. OTHER: None. IMPRESSION: 1. No acute intracranial process. 2. 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 Findings progressed from 2016. 3. 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 Electronically Signed   By: Awilda Metro M.D.   On: 05/05/2017 16:55   Dg Chest Portable 1 View  Result Date: 05/05/2017 CLINICAL DATA:  Patient with history of sepsis. EXAM: PORTABLE CHEST 1 VIEW COMPARISON:  Chest radiograph 02/20/2017 FINDINGS: Monitoring leads overlie the patient. Patient is rotated. Normal cardiac and mediastinal contours. Aortic vascular calcifications. Heterogeneous opacities lung bases bilaterally. No pleural effusion or pneumothorax. IMPRESSION: Basilar heterogeneous opacities may represent atelectasis or infection. Aortic atherosclerosis. Electronically Signed   By: Annia Belt M.D.   On: 05/05/2017 10:05    Procedures Procedures (including critical care time)  Medications Ordered in ED Medications  piperacillin-tazobactam (ZOSYN) IVPB 3.375 g (3.375 g Intravenous New Bag/Given 05/05/17 1853)  heparin ADULT infusion 100 units/mL (25000 units/262mL sodium chloride 0.45%) (1,100 Units/hr Intravenous New Bag/Given 05/05/17 1551)  finasteride (PROSCAR) tablet  5 mg (not administered)  multivitamin with minerals tablet 1 tablet (not administered)  omega-3 acid ethyl esters (LOVAZA) capsule 1 g (not administered)  pantoprazole (PROTONIX) EC tablet 40 mg (40 mg Oral Not Given 05/05/17 1752)  protein supplement (UNJURY CHICKEN SOUP) powder 2 oz (2 oz Oral Not Given 05/05/17 1700)  cyanocobalamin tablet 500 mcg (not administered)  collagenase (SANTYL) ointment 1 application (not administered)  feeding supplement (ENSURE ENLIVE) (ENSURE ENLIVE) liquid 237 mL (not administered)  diclofenac sodium (VOLTAREN) 1 % transdermal gel 1 application (not administered)  donepezil (ARICEPT) tablet 10 mg (not administered)  memantine (NAMENDA) tablet 10 mg (not administered)  fluticasone (FLONASE) 50 MCG/ACT nasal spray 2 spray (not administered)  montelukast (SINGULAIR) tablet 10 mg (not administered)  sertraline (ZOLOFT) tablet 25 mg (not administered)  tamsulosin (FLOMAX) capsule 0.4 mg (not administered)  sodium chloride flush (NS) 0.9 % injection 3 mL (not administered)  acetaminophen (TYLENOL) tablet 650 mg (not administered)    Or  acetaminophen (TYLENOL) suppository 650 mg (not administered)  ondansetron (ZOFRAN) tablet 4 mg (not administered)    Or  ondansetron (ZOFRAN) injection 4 mg (not administered)  dextrose 10 % infusion ( Intravenous New Bag/Given 05/05/17 1914)  sodium chloride 0.9 % bolus 1,000 mL (1,000 mLs Intravenous New Bag/Given 05/05/17 1319)    And  sodium chloride 0.9 % bolus 1,000 mL (0 mLs Intravenous Stopped 05/05/17 1317)    And  sodium chloride 0.9 % bolus 250 mL (0 mLs Intravenous Stopped 05/05/17 1217)  piperacillin-tazobactam (ZOSYN) IVPB 3.375 g (0 g Intravenous Stopped 05/05/17 1217)  vancomycin (VANCOCIN) IVPB 1000 mg/200 mL premix (0 mg Intravenous Stopped 05/05/17 1227)  heparin bolus via infusion 4,000 Units (4,000 Units Intravenous Bolus from Bag 05/05/17 1245)  potassium chloride 10 mEq in 100 mL IVPB (0 mEq Intravenous Stopped 05/05/17  1751)   CRITICAL CARE: hypernatremia Performed by: Rhae Lerner   Total critical care time: 30 minutes  Critical care time was exclusive of separately  billable procedures and treating other patients.  Critical care was necessary to treat or prevent imminent or life-threatening deterioration.  Critical care was time spent personally by me on the following activities: development of treatment plan with patient and/or surrogate as well as nursing, discussions with consultants, evaluation of patient's response to treatment, examination of patient, obtaining history from patient or surrogate, ordering and performing treatments and interventions, ordering and review of laboratory studies, ordering and review of radiographic studies, pulse oximetry and re-evaluation of patient's condition.   Initial Impression / Assessment and Plan / ED Course  I have reviewed the triage vital signs and the nursing notes.  Pertinent labs & imaging results that were available during my care of the patient were reviewed by me and considered in my medical decision making (see chart for details).     76 year old male with a history of dementia, asthma, BPH, htn, hlpd, admissions for rhabdomyolysis, frequent falls, decubitus ulcers, concern for anemia, thought to be GI bleed, and is in facility receiving vancomycin through PICC for osteomyelitis who presents with concern for right arm swelling. Initial hx reports pt also with altered mental status and fever at facility, however further hx from fever reports no acute change.  Initially given concern for fever at facility/ams, patient treated for sepsis with vancomycin and zosyn, with possible source including osteomyelitis, vs possible pneumonia on XR, vs UTI (urine pending.) Labs shows significant hypernatremia, and suspect dehydration as etiology.  Pt given IV fluids, potassium.    DVT study of upper arm positive. Pt prior hx for GI bleed, no history or  signs of this today, no significant change in hgb. Will initiate heparin and continue to monitor.  Will admit for DVT, hypernatremia, possible encephalopathy, r/o sepsis.   Final Clinical Impressions(s) / ED Diagnoses   Final diagnoses:  Hypernatremia  Acute deep vein thrombosis (DVT) of brachial vein of right upper extremity (HCC)  Hypokalemia    New Prescriptions Current Discharge Medication List       Alvira Monday, MD 05/05/17 2011

## 2017-05-05 NOTE — Progress Notes (Signed)
Removed  right double lumen midline, measured 12 cm. Catheter intact, site clean and dry.  Pressure dressing applied. No bleeding noted. Eliot FordSarah Doctor Sheahan RN [facility]

## 2017-05-05 NOTE — ED Triage Notes (Addendum)
[facility]. Pt has a pressure wound bilateral hips and a piccc line for Vanc. Staff at SNF report Pt rt arm was swollen on SAt. . Staff also reported Pt has not been as alert and has decreased appetite.

## 2017-05-05 NOTE — Progress Notes (Signed)
New Admission Note:    Arrival Method: Stretcher from ED Mental Orientation: Non verbal Assessment: In progress Skin: Sacral wounds and pressure wounds ZO:XWRUV:Left PIV     Infusing Heparin and 0.45 NS Pain: Non verbal Safety Measures: Side rails up, call  Bell in reach Admission:Pt does not speak. Unable to complete 6E Orientation: Complete Family:  N/A  Orders have been reviewed and implemented.  Will continue to monitor the patient.

## 2017-05-05 NOTE — ED Notes (Signed)
Patient transported to CT 

## 2017-05-05 NOTE — ED Notes (Signed)
Nurse drawing 2nd set of blood cultures.

## 2017-05-05 NOTE — Progress Notes (Signed)
VASCULAR LAB PRELIMINARY  PRELIMINARY  PRELIMINARY  PRELIMINARY  Right upper extremity venous duplex completed.    Preliminary report:  There is acute DVT throughout the right brachial vein.  PICC line is surrounded by thrombus.   Called report to Dr. Marene LenzSchlossman  Johne Buckle, [facility]AND[facility], RVT 05/05/2017, 12:12 PM

## 2017-05-05 NOTE — ED Notes (Addendum)
Bladder scan shows 105mL. Informed Magda PaganiniAudrey, Charity fundraiserN.

## 2017-05-05 NOTE — Progress Notes (Signed)
MD texted Lactic Acid result of 2.6.

## 2017-05-05 NOTE — Progress Notes (Signed)
ANTICOAGULATION CONSULT NOTE - Follow Up Consult  Pharmacy Consult for Heparin Indication: DVT - UE thrombus after PICC line insertion  No Known Allergies  Patient Measurements: Weight: 160 lb (72.6 kg) ([facility])  Vital Signs: Temp: 97.9 F (36.6 C) (07/08 1836) Temp Source: Oral (07/08 1836) BP: 118/89 (07/08 1836) Pulse Rate: 94 (07/08 1836)  Labs:  Recent Labs  05/05/17 0959 05/05/17 1815 05/05/17 1912  HGB 8.0*  --   --   HCT 27.1*  --   --   PLT 327  --   --   LABPROT 17.8*  --   --   INR 1.46  --   --   HEPARINUNFRC  --   --  0.25*  CREATININE 1.07 1.00  --     Estimated Creatinine Clearance: 66.6 mL/min (by C-G formula based on SCr of 1 mg/dL).   Medications:  Heparin @ 1100 units/hr  Assessment: 75yom started on heparin earlier today for new thrombus around his PICC line in his right UE. Initial heparin level is slightly below goal at 0.25. No bleeding.   Goal of Therapy:  Heparin level 0.3-0.7 units/ml Monitor platelets by anticoagulation protocol: Yes   Plan:  1) Increase heparin to 1300 units/hr 2) Follow up daily heparin level and CBC  Andre Ball, Andre Ball 05/05/2017,8:37 PM

## 2017-05-05 NOTE — H&P (Signed)
History and Physical    Andre Ball:[id number] DOB: 11/04/1940 DOA: 05/05/2017  PCP: Mirna Mires, MD Patient coming from: facility  Chief Complaint: acute encephalopathy/hypernatremia/hypokalemia  HPI: Andre Ball is a 76 y.o. male with medical history significant for dementia, BPH, hypertension, hyperlipidemia, osteomyelitis, anemia related to recent GI bleed, decubitus of the sacrum and right heel presents to the emergency department from the facility with the chief complaint of acute encephalopathy. Initial evaluation reveals hypernatremia hypokalemia mildly elevated lactic acid and DVT of the right upper extremity.  Information is obtained from the chart as patient is awake alert but nonverbal at this time. Chart patient had a PICC line placed July 5 for vancomycin presumably related to bilateral hip wounds. Today staff noticed swelling beneath the site of that line. Associated symptoms include acute encephalopathy in that patient is nonverbal. He is demented but will usually have conversation. In addition he has had decreased oral intake. No reports of any fever nausea vomiting diarrhea. No reports of shortness of breath cough lower extremity edema. EMS was called blood pressure reportedly 90/40. He received 1 L normal saline.    ED Course: In emergency department he is afebrile hemodynamically stable and not hypoxic. Provided with IV fluids vancomycin and Zosyn. Exam reveals right upper extremity DVT and heparin is initiated  Review of Systems: As per HPI otherwise all other systems reviewed and are negative.   Ambulatory Status: Patient wheelchair-bound of late due to recent fall secondary to generalized weakness from osteomyelitis and wounds to his feet  Past Medical History:  Diagnosis Date  . Arthritis   . Asthma   . BPH (benign prostatic hyperplasia)   . Memory deficits 11/01/2014    Past Surgical History:  Procedure Laterality Date  . TONSILLECTOMY       Social History   Social History  . Marital status: Married    Spouse name: N/A  . Number of children: 2  . Years of education: college de   Occupational History  . Retired    Social History Main Topics  . Smoking status: Former Games developer  . Smokeless tobacco: Never Used  . Alcohol use No     Comment: occasionally  . Drug use: No  . Sexual activity: Not on file     Comment: Married   Other Topics Concern  . Not on file   Social History Narrative   Lives at home w/ his wife   Patient is right handed.   Patient does not drink caffeine.    No Known Allergies  Family History  Problem Relation Age of Onset  . Alzheimer's disease Mother   . Alzheimer's disease Father   . Diabetes Brother   . Alzheimer's disease Sister   . Heart disease Brother   . Diabetes Brother   . Alzheimer's disease Sister   . Alzheimer's disease Brother   . Alzheimer's disease Brother     Prior to Admission medications   Medication Sig Start Date End Date Taking? Authorizing Provider  collagenase (SANTYL) ointment Apply 1 application topically daily. Patient taking differently: Apply 1 application topically every evening. Pt applies to right heel and sacrum. 02/04/17   Lenox Ponds, MD  diclofenac sodium (VOLTAREN) 1 % GEL Apply 1 application topically daily as needed (for leg/shoulder pain).     [provider]  donepezil (ARICEPT) 10 MG tablet Take 1 tablet (10 mg total) by mouth at bedtime. 08/20/16   York Spaniel, MD  feeding supplement, ENSURE ENLIVE, (  ENSURE ENLIVE) LIQD Take 237 mLs by mouth 2 (two) times daily between meals. 02/04/17   Lenox Ponds, MD  finasteride (PROSCAR) 5 MG tablet Take 5 mg by mouth daily.    [provider]  fluticasone (FLONASE) 50 MCG/ACT nasal spray Place 2 sprays into the nose daily. 01/30/13   Reuben Likes, MD  memantine (NAMENDA) 10 MG tablet Take 1 tablet (10 mg total) by mouth 2 (two) times daily. 08/20/16   York Spaniel, MD  montelukast (SINGULAIR) 10 MG tablet Take 10 mg by mouth at bedtime.    [provider]  Multiple Vitamin (MULTIVITAMIN WITH MINERALS) TABS tablet Take 1 tablet by mouth daily.    [provider]  nitroGLYCERIN (NITROSTAT) 0.4 MG SL tablet Place 0.4 mg under the tongue every 5 (five) minutes as needed for chest pain.    [provider]  omega-3 acid ethyl esters (LOVAZA) 1 g capsule Take 1 g by mouth daily.    [provider]  oxyCODONE-acetaminophen (PERCOCET/ROXICET) 5-325 MG tablet Take 1 tablet by mouth every 6 (six) hours as needed for moderate pain. 02/27/17   Narda Bonds, MD  pantoprazole (PROTONIX) 40 MG tablet Take 1 tablet (40 mg total) by mouth 2 (two) times daily before a meal. 02/27/17   Narda Bonds, MD  protein supplement (UNJURY CHICKEN SOUP) POWD Take 7 g (2 oz total) by mouth 4 (four) times daily. 02/27/17   Narda Bonds, MD  risperiDONE (RISPERDAL) 1 MG tablet Take 1 mg by mouth 2 (two) times daily.    [provider]  sertraline (ZOLOFT) 25 MG tablet Take 25 mg by mouth daily.    [provider]  tamsulosin (FLOMAX) 0.4 MG CAPS Take 0.4 mg by mouth daily.    [provider]  vitamin B-12 (CYANOCOBALAMIN) 500 MCG tablet Take 500 mcg by mouth daily.    [provider]    Physical Exam: Vitals:   05/05/17 1043 05/05/17 1200 05/05/17 1230 05/05/17 1245  BP:   118/68 123/83  Pulse:   87 89  Resp:   (!) 29 (!) 26  Temp: 99.3 F (37.4 C)     TempSrc: Rectal     SpO2:   98% 99%  Weight:  72.6 kg (160 lb)       General:  Appears calm and comfortable, will open eyes to verbal stimuli, quite thin and frail appearing. attempts to follow commands attempts to answer questions Eyes:  PERRL, EOMI, normal lids, iris temporal wasting ENT:  grossly normal hearing, lips & tongue, his membranes of his mouth are pink only slightly dry Neck:  no LAD, masses or thyromegaly Cardiovascular:  RRR, no  m/r/g. No LE edema.  Respiratory:  CTA bilaterally, no w/r/r. Normal respiratory effort but somewhat shallow. Breath sounds distant Abdomen:  soft, ntnd, positive bowel sounds no guarding or rebounding Skin:  no rash or induration seen on limited exam Musculoskeletal:  grossly normal tone BUE/BLE, good ROM, no bony abnormality Psychiatric:  grossly normal mood and affect, speech fluent and appropriate, AOx3 Neurologic:  Opens eyes to verbal stimuli. Will make eye contact. Attempts to follow simple commands. Attempts to answer simple questions. Will move upper extremities spontaneously.  Labs on Admission: I have personally reviewed following labs and imaging studies  CBC:  Recent Labs Lab 05/05/17 0959  WBC 8.4  NEUTROABS 7.3  HGB 8.0*  HCT 27.1*  MCV 87.7  PLT 327   Basic Metabolic Panel:  Recent Labs Lab 05/05/17 0959  NA 158*  K 3.1*  CL 118*  CO2 29  GLUCOSE 113*  BUN 27*  CREATININE 1.07  CALCIUM 7.6*   GFR: Estimated Creatinine Clearance: 62.2 mL/min (by C-G formula based on SCr of 1.07 mg/dL). Liver Function Tests:  Recent Labs Lab 05/05/17 0959  AST 118*  ALT 76*  ALKPHOS 114  BILITOT 0.4  PROT 6.3*  ALBUMIN 1.9*   No results for input(s): LIPASE, AMYLASE in the last 168 hours. No results for input(s): AMMONIA in the last 168 hours. Coagulation Profile:  Recent Labs Lab 05/05/17 0959  INR 1.46   Cardiac Enzymes: No results for input(s): CKTOTAL, CKMB, CKMBINDEX, TROPONINI in the last 168 hours. BNP (last 3 results) No results for input(s): PROBNP in the last 8760 hours. HbA1C: No results for input(s): HGBA1C in the last 72 hours. CBG: No results for input(s): GLUCAP in the last 168 hours. Lipid Profile: No results for input(s): CHOL, HDL, LDLCALC, TRIG, CHOLHDL, LDLDIRECT in the last 72 hours. Thyroid Function Tests: No results for input(s): TSH, T4TOTAL, FREET4, T3FREE, THYROIDAB in the last 72 hours. Anemia Panel: No results for  input(s): VITAMINB12, FOLATE, FERRITIN, TIBC, IRON, RETICCTPCT in the last 72 hours. Urine analysis:    Component Value Date/Time   COLORURINE YELLOW 02/21/2017 0125   APPEARANCEUR HAZY (A) 02/21/2017 0125   LABSPEC 1.024 02/21/2017 0125   PHURINE 5.0 02/21/2017 0125   GLUCOSEU NEGATIVE 02/21/2017 0125   HGBUR NEGATIVE 02/21/2017 0125   BILIRUBINUR NEGATIVE 02/21/2017 0125   KETONESUR NEGATIVE 02/21/2017 0125   PROTEINUR NEGATIVE 02/21/2017 0125   NITRITE NEGATIVE 02/21/2017 0125   LEUKOCYTESUR NEGATIVE 02/21/2017 0125    Creatinine Clearance: Estimated Creatinine Clearance: 62.2 mL/min (by C-G formula based on SCr of 1.07 mg/dL).  Sepsis Labs: @LABRCNTIP (procalcitonin:4,lacticidven:4) )No results found for this or any previous visit (from the past 240 hour(s)).   Radiological Exams on Admission: Dg Chest Portable 1 View  Result Date: 05/05/2017 CLINICAL DATA:  Patient with history of sepsis. EXAM: PORTABLE CHEST 1 VIEW COMPARISON:  Chest radiograph 02/20/2017 FINDINGS: Monitoring leads overlie the patient. Patient is rotated. Normal cardiac and mediastinal contours. Aortic vascular calcifications. Heterogeneous opacities lung bases bilaterally. No pleural effusion or pneumothorax. IMPRESSION: Basilar heterogeneous opacities may represent atelectasis or infection. Aortic atherosclerosis. Electronically Signed   By: Annia Belt M.D.   On: 05/05/2017 10:05    EKG: Independently reviewed. Fast sinus arrhythmia Minimal ST depression, inferior leads Since prior ECG, patient with sinus arrythmia , no other significant chang  Assessment/Plan Principal Problem:   Acute encephalopathy Active Problems:   Dehydration   Hypernatremia   Essential hypertension   Dementia   Malnutrition of moderate degree   Sacral decubitus ulcer, stage IV (HCC)   DVT (deep venous thrombosis) (HCC)   Hypokalemia   1. Acute encephalopathy. Likely multifactorial. Metabolic derangements related to  dehydration versus infectious process. Lactic acid only mildly elevated. No leukocytosis. Rectal temp 99. Recent history of osteomyelitis and sacral wound. Wound debrided last hospitalization. Vancomycin prescribed recently presumably for infected sacral wounds. Sodium elevated potassium 3.1. -Admit to telemetry -Gentle IV fluids -We'll continue antibiotics initiated in the emergency department -check b12 and folate -obtain urinalysis -follow blood culture -track lactic acid -wound consult -CT head for completeness  2. Hypernatremia/hypokalemia. History of same. Likely related to dehydration. Sodium level 158 testing level 3.1. He is given 2 L of normal saline in 3 runs of 10 mEq of potassium in the emergency room. He  doesn't look particularly dehydrated to me however he is not been taking by mouth fluids/nourishment over the last 2 days or so. No noted changes in meds recently -Change fluids to half-normal saline -Check magnesium and phosphorus -recheck in am -Hold home Lasix for now  #3. DVT right upper extremity. Patient with PICC line insertion 3 days ago. Ultrasound reveals acute DVT throughout the right brachial vein. PICC line surrounded by thrombus -continue heparin gtt for now  -will need long term anticoagulation -of note recently hospitalized for symptomatic anemia presumable from NSAID induced gi bleed. Evaluated by GI and endo deferred.  -monitor CBC  4. Anemia. Recently hospitalized for acute blood loss anemia. History heme-positive stools. Evaluated by GI presumably GI bleed related to NSAID use. He was transfused 3 units of packed RBCs. Aspirin and NSAIDs discontinued. GI deferred endoscopy at that point. Hemoglobin 8.0 on admission. This appears to be very close to his baseline -Monitor closely given need for heparin secondary to #3 -Continue PPI  #5. Hypertension. Fair control in the emergency department. Home medications include Lasix -Monitor  6. Sacral  osteomyelitis/wound indicate stage IV. Area debrided last hospitalization. Discharge summary dated May 2 indicates patient given vancomycin ceftriaxone and the metronidazole during last  Hospitalization. Note also indicates infectious disease was consulted and recommended a final regimen of Flagyl and Rocephin and date to be June 8 to complete a six-week course. Blood cultures were negative. Unclear to me why he got a PICC line July 5 vancomycin presumably recurrent infection sacral decub. -See above -Antibiotics as noted above  DVT prophylaxis: heparin  Code Status: DNR  Family Communication: none present  Disposition Plan: back to facility  Consults called: none  Admission status: obs    Gwenyth BenderBLACK,KAREN M MD [facility]  If 7PM-7AM, please contact night-coverage [email or web address] Password [email or web address]  05/05/2017, 2:15 PM

## 2017-05-05 NOTE — Progress Notes (Signed)
ANTICOAGULATION CONSULT NOTE - Initial Consult  Pharmacy Consult for heparin  Indication: DVT - UE thrombus after PICC line insertion  No Known Allergies  Patient Measurements: Weight: 160 lb (72.6 kg) ([facility])   Vital Signs: Temp: 99.3 F (37.4 C) (07/08 1043) Temp Source: Rectal (07/08 1043)  Labs:  Recent Labs  05/05/17 0959  HGB 8.0*  HCT 27.1*  PLT 327  LABPROT 17.8*  INR 1.46  CREATININE 1.07    Estimated Creatinine Clearance: 62.2 mL/min (by C-G formula based on SCr of 1.07 mg/dL).   Medical History: Past Medical History:  Diagnosis Date  . Arthritis   . Asthma   . BPH (benign prostatic hyperplasia)   . Memory deficits 11/01/2014     Assessment: 75yom with Hx osteomyelitis with PICC line placed 7/5 for IV abx.  He now has UE swelling and found to have new thrombus around PICC line.  CBC stable, no bleeding noted, no anticoagulation PTA.  Start IV heparin drip.    Goal of Therapy:  Heparin level 0.3-0.7 units/ml Monitor platelets by anticoagulation protocol: Yes   Plan:  Heparin bolus 4000 uts IV x1 Heparin drip 1100 uts/hr  HL in 6hr Daily HL, CBC  Leota SauersLisa Tacy Chavis Pharm.D. CPP, BCPS Clinical Pharmacist [phone] 05/05/2017 12:44 PM

## 2017-05-05 NOTE — ED Notes (Signed)
Bladder scan shows >14382mL.

## 2017-05-05 NOTE — Progress Notes (Addendum)
RN attempted to complete pts admission history. Pt is nonverbal. Unable to complete Admission History

## 2017-05-05 NOTE — Progress Notes (Signed)
Pharmacy Antibiotic Note  Andre Ball is a 76 y.o. male admitted on 05/05/2017 with fever, altered MS and Hx osteomyelitis with recent PICC line placed for vancomycin.    Pharmacy has been consulted for vancomycin and Zosyn  Dosing.  Tm 99, WBC wnl, Cr 1, no notes for outpatient Vancomycin dosing currently available- f/u  but recent hospitlaization VT elevated 22 on vancomycin 1gm q12g and dose reduced 750mg  q12h - patient then d/c so no follow up VT available.  Vancomycin 1gm given in ED will check VT in am and then order follow up doses.  Zosyn 3.375gm iv x1 given in ED   Plan: VT in am then redose Zosyn 3.375gm q8h EI     Temp (24hrs), Avg:99.3 F (37.4 C), Min:99.3 F (37.4 C), Max:99.3 F (37.4 C)   Recent Labs Lab 05/05/17 0959 05/05/17 1015  WBC 8.4  --   CREATININE 1.07  --   LATICACIDVEN  --  1.94*    CrCl cannot be calculated (Unknown ideal weight.).    No Known Allergies     Leota SauersLisa Rudie Sermons Pharm.D. CPP, BCPS Clinical Pharmacist [phone] 05/05/2017 11:28 AM

## 2017-05-06 DIAGNOSIS — L89613 Pressure ulcer of right heel, stage 3: Secondary | ICD-10-CM | POA: Diagnosis present

## 2017-05-06 DIAGNOSIS — I82621 Acute embolism and thrombosis of deep veins of right upper extremity: Secondary | ICD-10-CM

## 2017-05-06 DIAGNOSIS — E46 Unspecified protein-calorie malnutrition: Secondary | ICD-10-CM | POA: Diagnosis not present

## 2017-05-06 DIAGNOSIS — G9340 Encephalopathy, unspecified: Secondary | ICD-10-CM

## 2017-05-06 DIAGNOSIS — L89614 Pressure ulcer of right heel, stage 4: Secondary | ICD-10-CM | POA: Diagnosis not present

## 2017-05-06 DIAGNOSIS — M4628 Osteomyelitis of vertebra, sacral and sacrococcygeal region: Secondary | ICD-10-CM | POA: Diagnosis present

## 2017-05-06 DIAGNOSIS — G9341 Metabolic encephalopathy: Secondary | ICD-10-CM | POA: Diagnosis present

## 2017-05-06 DIAGNOSIS — F0390 Unspecified dementia without behavioral disturbance: Secondary | ICD-10-CM

## 2017-05-06 DIAGNOSIS — Z66 Do not resuscitate: Secondary | ICD-10-CM | POA: Diagnosis present

## 2017-05-06 DIAGNOSIS — M6282 Rhabdomyolysis: Secondary | ICD-10-CM | POA: Diagnosis not present

## 2017-05-06 DIAGNOSIS — R627 Adult failure to thrive: Secondary | ICD-10-CM | POA: Diagnosis present

## 2017-05-06 DIAGNOSIS — R4182 Altered mental status, unspecified: Secondary | ICD-10-CM | POA: Diagnosis present

## 2017-05-06 DIAGNOSIS — Z79899 Other long term (current) drug therapy: Secondary | ICD-10-CM | POA: Diagnosis not present

## 2017-05-06 DIAGNOSIS — M25519 Pain in unspecified shoulder: Secondary | ICD-10-CM | POA: Diagnosis not present

## 2017-05-06 DIAGNOSIS — K922 Gastrointestinal hemorrhage, unspecified: Secondary | ICD-10-CM | POA: Diagnosis not present

## 2017-05-06 DIAGNOSIS — N400 Enlarged prostate without lower urinary tract symptoms: Secondary | ICD-10-CM | POA: Diagnosis present

## 2017-05-06 DIAGNOSIS — I10 Essential (primary) hypertension: Secondary | ICD-10-CM | POA: Diagnosis not present

## 2017-05-06 DIAGNOSIS — Z9181 History of falling: Secondary | ICD-10-CM | POA: Diagnosis not present

## 2017-05-06 DIAGNOSIS — E440 Moderate protein-calorie malnutrition: Secondary | ICD-10-CM | POA: Diagnosis not present

## 2017-05-06 DIAGNOSIS — D649 Anemia, unspecified: Secondary | ICD-10-CM | POA: Diagnosis present

## 2017-05-06 DIAGNOSIS — L89154 Pressure ulcer of sacral region, stage 4: Secondary | ICD-10-CM | POA: Diagnosis not present

## 2017-05-06 DIAGNOSIS — K219 Gastro-esophageal reflux disease without esophagitis: Secondary | ICD-10-CM | POA: Diagnosis not present

## 2017-05-06 DIAGNOSIS — E876 Hypokalemia: Secondary | ICD-10-CM | POA: Diagnosis not present

## 2017-05-06 DIAGNOSIS — L89224 Pressure ulcer of left hip, stage 4: Secondary | ICD-10-CM | POA: Diagnosis not present

## 2017-05-06 DIAGNOSIS — Z993 Dependence on wheelchair: Secondary | ICD-10-CM | POA: Diagnosis not present

## 2017-05-06 DIAGNOSIS — Z681 Body mass index (BMI) 19 or less, adult: Secondary | ICD-10-CM | POA: Diagnosis not present

## 2017-05-06 DIAGNOSIS — F29 Unspecified psychosis not due to a substance or known physiological condition: Secondary | ICD-10-CM | POA: Diagnosis not present

## 2017-05-06 DIAGNOSIS — M6281 Muscle weakness (generalized): Secondary | ICD-10-CM | POA: Diagnosis not present

## 2017-05-06 DIAGNOSIS — E870 Hyperosmolality and hypernatremia: Secondary | ICD-10-CM | POA: Diagnosis not present

## 2017-05-06 DIAGNOSIS — Z515 Encounter for palliative care: Secondary | ICD-10-CM | POA: Diagnosis not present

## 2017-05-06 DIAGNOSIS — N401 Enlarged prostate with lower urinary tract symptoms: Secondary | ICD-10-CM | POA: Diagnosis not present

## 2017-05-06 DIAGNOSIS — R262 Difficulty in walking, not elsewhere classified: Secondary | ICD-10-CM | POA: Diagnosis not present

## 2017-05-06 DIAGNOSIS — R1312 Dysphagia, oropharyngeal phase: Secondary | ICD-10-CM | POA: Diagnosis not present

## 2017-05-06 DIAGNOSIS — Z7189 Other specified counseling: Secondary | ICD-10-CM | POA: Diagnosis not present

## 2017-05-06 DIAGNOSIS — F339 Major depressive disorder, recurrent, unspecified: Secondary | ICD-10-CM | POA: Diagnosis not present

## 2017-05-06 DIAGNOSIS — J45909 Unspecified asthma, uncomplicated: Secondary | ICD-10-CM | POA: Diagnosis present

## 2017-05-06 DIAGNOSIS — F329 Major depressive disorder, single episode, unspecified: Secondary | ICD-10-CM | POA: Diagnosis not present

## 2017-05-06 DIAGNOSIS — E785 Hyperlipidemia, unspecified: Secondary | ICD-10-CM | POA: Diagnosis present

## 2017-05-06 DIAGNOSIS — Z87891 Personal history of nicotine dependence: Secondary | ICD-10-CM | POA: Diagnosis not present

## 2017-05-06 DIAGNOSIS — L89893 Pressure ulcer of other site, stage 3: Secondary | ICD-10-CM | POA: Diagnosis not present

## 2017-05-06 DIAGNOSIS — E860 Dehydration: Secondary | ICD-10-CM | POA: Diagnosis present

## 2017-05-06 DIAGNOSIS — M79673 Pain in unspecified foot: Secondary | ICD-10-CM | POA: Diagnosis not present

## 2017-05-06 DIAGNOSIS — N289 Disorder of kidney and ureter, unspecified: Secondary | ICD-10-CM | POA: Diagnosis not present

## 2017-05-06 LAB — BLOOD CULTURE ID PANEL (REFLEXED)
Acinetobacter baumannii: NOT DETECTED
CANDIDA TROPICALIS: NOT DETECTED
Candida albicans: NOT DETECTED
Candida glabrata: NOT DETECTED
Candida krusei: NOT DETECTED
Candida parapsilosis: NOT DETECTED
ENTEROBACTERIACEAE SPECIES: NOT DETECTED
Enterobacter cloacae complex: NOT DETECTED
Enterococcus species: NOT DETECTED
Escherichia coli: NOT DETECTED
HAEMOPHILUS INFLUENZAE: NOT DETECTED
KLEBSIELLA PNEUMONIAE: NOT DETECTED
Klebsiella oxytoca: NOT DETECTED
Listeria monocytogenes: NOT DETECTED
NEISSERIA MENINGITIDIS: NOT DETECTED
PROTEUS SPECIES: NOT DETECTED
Pseudomonas aeruginosa: NOT DETECTED
SERRATIA MARCESCENS: NOT DETECTED
STAPHYLOCOCCUS AUREUS BCID: NOT DETECTED
STAPHYLOCOCCUS SPECIES: NOT DETECTED
STREPTOCOCCUS AGALACTIAE: NOT DETECTED
STREPTOCOCCUS SPECIES: NOT DETECTED
Streptococcus pneumoniae: NOT DETECTED
Streptococcus pyogenes: NOT DETECTED

## 2017-05-06 LAB — BASIC METABOLIC PANEL
ANION GAP: 10 (ref 5–15)
Anion gap: 8 (ref 5–15)
BUN: 25 mg/dL — AB (ref 6–20)
BUN: 25 mg/dL — ABNORMAL HIGH (ref 6–20)
CHLORIDE: 120 mmol/L — AB (ref 101–111)
CO2: 27 mmol/L (ref 22–32)
CO2: 24 mmol/L (ref 22–32)
CREATININE: 0.92 mg/dL (ref 0.61–1.24)
Calcium: 7.4 mg/dL — ABNORMAL LOW (ref 8.9–10.3)
Calcium: 6.8 mg/dL — ABNORMAL LOW (ref 8.9–10.3)
Chloride: 121 mmol/L — ABNORMAL HIGH (ref 101–111)
Creatinine, Ser: 1.06 mg/dL (ref 0.61–1.24)
GFR calc Af Amer: >60 mL/min (ref >60)
GFR calc Af Amer: >60 mL/min (ref >60)
GFR calc non Af Amer: >60 mL/min (ref >60)
GFR calc non Af Amer: >60 mL/min (ref >60)
GLUCOSE: 130 mg/dL — AB (ref 65–99)
GLUCOSE: 103 mg/dL — AB (ref 65–99)
POTASSIUM: 3.3 mmol/L — AB (ref 3.5–5.1)
POTASSIUM: 5.9 mmol/L — AB (ref 3.5–5.1)
Sodium: 155 mmol/L — ABNORMAL HIGH (ref 135–145)
Sodium: 155 mmol/L — ABNORMAL HIGH (ref 135–145)

## 2017-05-06 LAB — CBC
HCT: 27.4 % — ABNORMAL LOW (ref 39.0–52.0)
HEMOGLOBIN: 8.3 g/dL — AB (ref 13.0–17.0)
MCH: 26.4 pg (ref 26.0–34.0)
MCHC: 30.3 g/dL (ref 30.0–36.0)
MCV: 87.3 fL (ref 78.0–100.0)
PLATELETS: 292 10*3/uL (ref 150–400)
RBC: 3.14 MIL/uL — ABNORMAL LOW (ref 4.22–5.81)
RDW: 16.8 % — ABNORMAL HIGH (ref 11.5–15.5)
WBC: 8.1 10*3/uL (ref 4.0–10.5)

## 2017-05-06 LAB — HEPARIN LEVEL (UNFRACTIONATED): Heparin Unfractionated: 0.32 IU/mL (ref 0.30–0.70)

## 2017-05-06 LAB — URINE CULTURE: Culture: NO GROWTH

## 2017-05-06 LAB — VANCOMYCIN, TROUGH: Vancomycin Tr: 16 ug/mL (ref 15–20)

## 2017-05-06 LAB — GLUCOSE, CAPILLARY: Glucose-Capillary: 123 mg/dL — ABNORMAL HIGH (ref 65–99)

## 2017-05-06 MED ORDER — POTASSIUM CHLORIDE 10 MEQ/100ML IV SOLN
10.0000 meq | Freq: Once | INTRAVENOUS | Status: AC
  Administered 2017-05-06: 10 meq via INTRAVENOUS
  Filled 2017-05-06: qty 100

## 2017-05-06 MED ORDER — VANCOMYCIN HCL IN DEXTROSE 750-5 MG/150ML-% IV SOLN
750.0000 mg | Freq: Two times a day (BID) | INTRAVENOUS | Status: DC
  Administered 2017-05-06 – 2017-05-07 (×3): 750 mg via INTRAVENOUS
  Filled 2017-05-06 (×4): qty 150

## 2017-05-06 MED ORDER — POTASSIUM CHLORIDE 10 MEQ/100ML IV SOLN
10.0000 meq | INTRAVENOUS | Status: DC
  Filled 2017-05-06: qty 100

## 2017-05-06 MED ORDER — POTASSIUM CHLORIDE 10 MEQ/100ML IV SOLN
10.0000 meq | INTRAVENOUS | Status: AC
  Administered 2017-05-06 (×3): 10 meq via INTRAVENOUS
  Filled 2017-05-06 (×2): qty 100

## 2017-05-06 NOTE — Progress Notes (Signed)
Pharmacy Antibiotic Note  Andre Ball is a 76 y.o. male admitted on 05/05/2017 with sepsis.  Pharmacy has been consulted for Vancomycin dosing.  Pt has been on outpatient vancomycin for osteomyelitis.   Vancomycin level this AM is 16 (drawn almost 20 hours after Vancomycin 1000 mg IV x 1 in the ED). Last dose of vancomycin at the nursing home was Vancomycin 1000 mg given at 2100 on 7/7 (regimen was 1000 mg IV q12h). Therefore, true trough (11-12 hours post-dose) would have been elevated.   Plan: Reduce vancomycin to 750 mg IV q12h Trend WBC, temp, renal function  F/U infectious work-up Drug levels as indicated   Weight: 125 lb 3.5 oz (56.8 kg)  Temp (24hrs), Avg:98.5 F (36.9 C), Min:97.9 F (36.6 C), Max:99.3 F (37.4 C)   Recent Labs Lab 05/05/17 0959 05/05/17 1015 05/05/17 1815 05/05/17 1912 05/05/17 2214 05/06/17 0112 05/06/17 0431  WBC 8.4  --   --   --  8.0 8.1  --   CREATININE 1.07  --  1.00  --  0.98 0.92  --   LATICACIDVEN  --  1.94*  --  2.6*  --   --   --   VANCOTROUGH  --   --   --   --   --   --  16    Estimated Creatinine Clearance: 56.6 mL/min (by C-G formula based on SCr of 0.92 mg/dL).    No Known Allergies   Abran DukeLedford, Wille Aubuchon 05/06/2017 5:43 AM

## 2017-05-06 NOTE — Progress Notes (Signed)
PROGRESS NOTE    Andre Ball  ZOX:[id number] DOB: 17-Apr-1941 DOA: 05/05/2017 PCP: Mirna Mires, MD   Outpatient Specialists:     Brief Narrative:  Andre Ball is a 76 y.o. male with medical history significant for dementia, BPH, hypertension, hyperlipidemia, osteomyelitis, anemia related to recent GI bleed, decubitus of the sacrum and right heel presents to the emergency department from the facility with the chief complaint of acute encephalopathy. Initial evaluation reveals hypernatremia hypokalemia mildly elevated lactic acid and DVT of the right upper extremity.  Information is obtained from the chart as patient is awake alert but nonverbal at this time. Chart patient had a PICC line placed July 5 for vancomycin presumably related to bilateral hip wounds. Today staff noticed swelling beneath the site of that line. Associated symptoms include acute encephalopathy in that patient is nonverbal. He is demented but will usually have conversation. In addition he has had decreased oral intake. No reports of any fever nausea vomiting diarrhea. No reports of shortness of breath cough lower extremity edema. EMS was called blood pressure reportedly 90/40. He received 1 L normal saline.   Assessment & Plan:   Principal Problem:   Acute encephalopathy Active Problems:   Dehydration   Hypernatremia   Essential hypertension   Dementia   Malnutrition of moderate degree   Sacral decubitus ulcer, stage IV (HCC)   DVT (deep venous thrombosis) (HCC)   Hypokalemia   Acute encephalopathy. -Likely multifactorial. Metabolic derangements related to dehydration versus infectious process. -check b12 (high)  and folate (pending) -urinalysis ok -follow blood culture -wound consult- placed on abx in ER-- had debridement last hospitalization -CT head: Severe bitemporal lobe atrophy associated with neurodegenerative disorders  Hypernatremia/hypokalemia.  -he has not been taking by mouth  fluids/nourishment over the last 2 days or so. No noted changes in meds recently -IVF -recheck daily -Hold home Lasix for now  DVT right upper extremity. Patient with PICC line insertion 3 days ago? Ultrasound reveals acute DVT throughout the right brachial vein. PICC line surrounded by thrombus -continue heparin gtt for now  -will need long term anticoagulation -of note recently hospitalized for symptomatic anemia presumable from NSAID induced gi bleed. Evaluated by GI and endo deferred.  -monitor CBC   Anemia. Recently hospitalized for acute blood loss anemia. History heme-positive stools. Evaluated by GI presumably GI bleed related to NSAID use. He was transfused 3 units of packed RBCs. Aspirin and NSAIDs discontinued. GI deferred endoscopy at that point. Hemoglobin 8.0 on admission. This appears to be very close to his baseline -Continue PPI  Hypertension. Fair control in the emergency department. Home medications include Lasix -Monitor  Sacral osteomyelitis/wound indicate stage IV. Area debrided last hospitalization. Discharge summary dated May 2 indicates patient given vancomycin ceftriaxone and the metronidazole during last  Hospitalization. Note also indicates infectious disease was consulted and recommended a final regimen of Flagyl and Rocephin and date to be June 8 to complete a six-week course. Blood cultures were negative. Unclear to me why he got a PICC line July 5 for  vancomycin presumably recurrent infection sacral decub. -WOC consult  Elevated LFTs -trend  Palliative care consult for GOC-  FTT   DVT prophylaxis:  Fully anticoagulated   Code Status: DNR   Family Communication:   Disposition Plan:  From SNF (at baseline able to hold conversation)   Consultants:       Subjective: Minimally responsive  Objective: Vitals:   05/05/17 1836 05/05/17 2046 05/06/17 0536 05/06/17 0922  BP:  118/89 (!) 142/61 129/71 136/87  Pulse: 94 80 81 74  Resp:   (!) 21 20 18   Temp: 97.9 F (36.6 C) 98.5 F (36.9 C) 98.1 F (36.7 C) 97.7 F (36.5 C)  TempSrc: Oral Oral Oral Oral  SpO2: 98% 96% 100% 99%  Weight:  56.8 kg (125 lb 3.5 oz)      Intake/Output Summary (Last 24 hours) at 05/06/17 1140 Last data filed at 05/06/17 1610  Gross per 24 hour  Intake          2687.15 ml  Output                0 ml  Net          2687.15 ml   Filed Weights   05/05/17 1200 05/05/17 2046  Weight: 72.6 kg (160 lb) 56.8 kg (125 lb 3.5 oz)    Examination:  General exam: in bed, mumbles few words Respiratory system: diminished, no wheezing Cardiovascular system: S1 & S2 heard, RRR. No JVD, murmurs, rubs, gallops or clicks. No pedal edema. Gastrointestinal system: thin, cachetic  Central nervous system: minimally responsive Extremities: not cooperative with exam     Data Reviewed: I have personally reviewed following labs and imaging studies  CBC:  Recent Labs Lab 05/05/17 0959 05/05/17 2214 05/06/17 0112  WBC 8.4 8.0 8.1  NEUTROABS 7.3  --   --   HGB 8.0* 9.0* 8.3*  HCT 27.1* 30.1* 27.4*  MCV 87.7 87.8 87.3  PLT 327 323 292   Basic Metabolic Panel:  Recent Labs Lab 05/05/17 0959 05/05/17 1815 05/05/17 2214 05/06/17 0112  NA 158* 157* 156* 155*  K 3.1* 3.8 3.5 3.3*  CL 118* 120* 118* 120*  CO2 29 25 29 27   GLUCOSE 113* 93 127* 130*  BUN 27* 25* 24* 25*  CREATININE 1.07 1.00 0.98 0.92  CALCIUM 7.6* 7.4* 7.6* 7.4*  MG  --  2.4  --   --   PHOS  --  3.5  --   --    GFR: Estimated Creatinine Clearance: 56.6 mL/min (by C-G formula based on SCr of 0.92 mg/dL). Liver Function Tests:  Recent Labs Lab 05/05/17 0959  AST 118*  ALT 76*  ALKPHOS 114  BILITOT 0.4  PROT 6.3*  ALBUMIN 1.9*   No results for input(s): LIPASE, AMYLASE in the last 168 hours. No results for input(s): AMMONIA in the last 168 hours. Coagulation Profile:  Recent Labs Lab 05/05/17 0959  INR 1.46   Cardiac Enzymes: No results for input(s): CKTOTAL,  CKMB, CKMBINDEX, TROPONINI in the last 168 hours. BNP (last 3 results) No results for input(s): PROBNP in the last 8760 hours. HbA1C: No results for input(s): HGBA1C in the last 72 hours. CBG: No results for input(s): GLUCAP in the last 168 hours. Lipid Profile: No results for input(s): CHOL, HDL, LDLCALC, TRIG, CHOLHDL, LDLDIRECT in the last 72 hours. Thyroid Function Tests: No results for input(s): TSH, T4TOTAL, FREET4, T3FREE, THYROIDAB in the last 72 hours. Anemia Panel:  Recent Labs  05/05/17 1815  VITAMINB12 2,099*   Urine analysis:    Component Value Date/Time   COLORURINE YELLOW 05/05/2017 1652   APPEARANCEUR HAZY (A) 05/05/2017 1652   LABSPEC 1.021 05/05/2017 1652   PHURINE 5.0 05/05/2017 1652   GLUCOSEU NEGATIVE 05/05/2017 1652   HGBUR NEGATIVE 05/05/2017 1652   BILIRUBINUR NEGATIVE 05/05/2017 1652   KETONESUR NEGATIVE 05/05/2017 1652   PROTEINUR NEGATIVE 05/05/2017 1652   NITRITE NEGATIVE 05/05/2017 1652   LEUKOCYTESUR NEGATIVE  05/05/2017 1652     Recent Results (from the past 240 hour(s))  Culture, blood (Routine x 2)     Status: None (Preliminary result)   Collection Time: 05/05/17 10:00 AM  Result Value Ref Range Status   Specimen Description BLOOD RIGHT ARM  Final   Special Requests   Final    BOTTLES DRAWN AEROBIC AND ANAEROBIC Blood Culture adequate volume   Culture NO GROWTH 1 DAY  Final   Report Status PENDING  Incomplete  Culture, blood (Routine x 2)     Status: None (Preliminary result)   Collection Time: 05/05/17  7:15 PM  Result Value Ref Range Status   Specimen Description BLOOD RIGHT HAND  Final   Special Requests IN PEDIATRIC BOTTLE Blood Culture adequate volume  Final   Culture NO GROWTH < 24 HOURS  Final   Report Status PENDING  Incomplete      Anti-infectives    Start     Dose/Rate Route Frequency Ordered Stop   05/06/17 0800  vancomycin (VANCOCIN) IVPB 750 mg/150 ml premix     750 mg 150 mL/hr over 60 Minutes Intravenous Every 12  hours 05/06/17 0555     05/05/17 1800  piperacillin-tazobactam (ZOSYN) IVPB 3.375 g     3.375 g 12.5 mL/hr over 240 Minutes Intravenous Every 8 hours 05/05/17 1116     05/05/17 1030  piperacillin-tazobactam (ZOSYN) IVPB 3.375 g     3.375 g 100 mL/hr over 30 Minutes Intravenous  Once 05/05/17 1017 05/05/17 1217   05/05/17 1030  vancomycin (VANCOCIN) IVPB 1000 mg/200 mL premix     1,000 mg 200 mL/hr over 60 Minutes Intravenous  Once 05/05/17 1017 05/05/17 1227       Radiology Studies: Ct Head Wo Contrast  Result Date: 05/05/2017 CLINICAL DATA:  Altered mental status, decreased appetite. EXAM: CT HEAD WITHOUT CONTRAST TECHNIQUE: Contiguous axial images were obtained from the base of the skull through the vertex without intravenous contrast. COMPARISON:  CT HEAD January 31, 2017 and MRI of the head December 02, 2014 FINDINGS: BRAIN: Moderate to severe ventriculomegaly, disproportionate bitemporal horn enlargement on ex vacuo basis. Overall commensurate enlargement of cerebral sulci. Confluent supratentorial and pontine white matter hypodensities. 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 No intraparenchymal hemorrhage, mass effect, midline shift or acute large vascular territory infarcts. No abnormal extra-axial fluid collections. Basal cisterns are patent. VASCULAR: Mild calcific atherosclerosis of the carotid siphons. SKULL: No skull fracture. No significant scalp soft tissue swelling. SINUSES/ORBITS: Minimal secretions LEFT sphenoid sinus without air-fluid levels. Pneumatized petrous apices. The included ocular globes and orbital contents are non-suspicious. OTHER: None. IMPRESSION: 1. No acute intracranial process. 2. 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 Findings progressed from 2016. 3. 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  Electronically Signed   By: Awilda Metro M.D.   On: 05/05/2017 16:55   Dg Chest Portable 1 View  Result Date: 05/05/2017 CLINICAL DATA:  Patient with history of sepsis. EXAM: PORTABLE CHEST 1 VIEW COMPARISON:  Chest radiograph 02/20/2017 FINDINGS: Monitoring leads overlie the patient. Patient is rotated. Normal cardiac and mediastinal contours. Aortic vascular calcifications. Heterogeneous opacities lung bases bilaterally. No pleural effusion or pneumothorax. IMPRESSION: Basilar heterogeneous opacities may represent atelectasis or infection. Aortic atherosclerosis. Electronically Signed   By: Annia Belt M.D.   On: 05/05/2017 10:05        Scheduled Meds: . collagenase  1 application  Topical Daily  . cyanocobalamin  500 mcg Oral Daily  . donepezil  10 mg Oral QHS  . feeding supplement (ENSURE ENLIVE)  237 mL Oral BID BM  . finasteride  5 mg Oral Daily  . fluticasone  2 spray Each Nare Daily  . memantine  10 mg Oral BID  . montelukast  10 mg Oral QHS  . multivitamin with minerals  1 tablet Oral Daily  . omega-3 acid ethyl esters  1 g Oral Daily  . pantoprazole  40 mg Oral BID AC  . protein supplement  2 oz Oral QID  . sertraline  25 mg Oral Daily  . sodium chloride flush  3 mL Intravenous Q12H  . tamsulosin  0.4 mg Oral Daily   Continuous Infusions: . dextrose 75 mL/hr at 05/05/17 1914  . heparin 1,450 Units/hr (05/06/17 1026)  . piperacillin-tazobactam (ZOSYN)  IV 3.375 g (05/06/17 1054)  . potassium chloride    . vancomycin 750 mg (05/06/17 1054)     LOS: 0 days    Time spent: 25 min    JESSICA U VANN, DO [facility] Pager [phone]  If 7PM-7AM, please contact night-coverage [email or web address] Password [email or web address] 05/06/2017, 11:40 AM

## 2017-05-06 NOTE — Progress Notes (Signed)
ANTICOAGULATION CONSULT NOTE - Follow Up Consult  Pharmacy Consult for Heparin Indication: DVT - UE thrombus after PICC line insertion  No Known Allergies  Patient Measurements: Weight: 125 lb 3.5 oz (56.8 kg)  Vital Signs: Temp: 98.1 F (36.7 C) (07/09 0536) Temp Source: Oral (07/09 0536) BP: 129/71 (07/09 0536) Pulse Rate: 81 (07/09 0536)  Labs:  Recent Labs  05/05/17 0959 05/05/17 1815 05/05/17 1912 05/05/17 2214 05/06/17 0112 05/06/17 0431  HGB 8.0*  --   --  9.0* 8.3*  --   HCT 27.1*  --   --  30.1* 27.4*  --   PLT 327  --   --  323 292  --   LABPROT 17.8*  --   --   --   --   --   INR 1.46  --   --   --   --   --   HEPARINUNFRC  --   --  0.25*  --   --  0.32  CREATININE 1.07 1.00  --  0.98 0.92  --     Estimated Creatinine Clearance: 56.6 mL/min (by C-G formula based on SCr of 0.92 mg/dL).   Medications:  Heparin @ 1300 units/hr  Assessment: 75yom started on heparin for new thrombus around his PICC line in his right UE. Initial heparin level was below goal and rate was increased to 1300 units/hr. Level is now in therapeutic range, but on lower end at 0.32units/mL.  Hgb low at 8.3, plts in range at 292- no bleeding noted.  Goal of Therapy:  Heparin level 0.3-0.7 units/ml Monitor platelets by anticoagulation protocol: Yes   Plan:  - Increase heparin to 1450 units/hr - Daily heparin level and CBC - Follow long term anticoagulation plans  Andre Ball D. Andre Ball, PharmD, BCPS Clinical Pharmacist Pager: [phone] Clinical Phone for 05/06/2017 until 3:30pm: [phone] If after 3:30pm, please call main pharmacy at [phone] 05/06/2017 8:48 AM

## 2017-05-06 NOTE — Consult Note (Signed)
Consultation Note Date: 05/06/2017   Patient Name: Andre Ball  DOB: 16-Sep-1941  MRN: [id number]  Age / Sex: 76 y.o., male  PCP: Mirna MiresHill, Gerald, MD Referring Physician: Joseph ArtVann, Jessica U, DO  Reason for Consultation: Establishing goals of care  HPI/Patient Profile: 76 y.o. male  with past medical history of dementia (conversational at baseline), GI bleed, BPH, and HTN. He was here 4/25-5/2 for GI bleed and treated for sacral osteomyelitis. At discharge the plan was for a six week course of antibiotics, to end 6/8. During this last admission he did meet with Palliative Care. A MOST form was filled out, which included DNR and limited additional interventions such as IVF and ABX as indicated and a time limited trial of a feeding tube. He now presents with confusion and was admitted on 05/05/2017. Work-up revealed hypernatremia, hypokalemia, mildly elevated lactic acid, and a DVT of the RUE. He is s/p PICC placement on 7/5, presumably for abx for hip wounds.   Clinical Assessment and Goals of Care: I was able to meet with Mrs. Bolger outside of the room, per her request. Her son, Felipa Etherrence, was on the phone. Andre Ball was unable to participate in a goals of care conversation due to dementia with overlying encephalopathy.   Mrs. Dogan recalled her meeting with my team on her husband's prior admission, and understood his deterioration was a real possibility. She is very saddened by how things have progressed, but also very realistic that he has now approached the point when recovery is extremely unlikely. She wants him to be alive, but understands she is now prolonging his suffering. After a good conversation with Dr. Benjamine MolaVann earlier today, she understands he is approaching the end of his life. Her goal now is for him to be comfortable and have dignity in his final days.   We talked though option for ensuring comfort, and I recommended residential Hospice. Andre Ball has  significant wounds, a chronic infection, and is now no longer eating or drinking. I believe he likely has days to a week left. Mrs. Koplin and her son agreed with Hospice, and wanted a chance to meet with the liaison to discus the services they would provide at the hospice house, as well as the cost. [facility]. Finally, we did discuss the process of transitioning to comfort care. They are comfortable with focusing on symptom management and stopping antibiotics.   Primary Decision Maker Pt's wife.   SUMMARY OF RECOMMENDATIONS    DNR, full comfort care  SW consult placed for residential hospice. I will also call [facility] rep to coordinate a meeting time with the family (they are at the hospital very sporadically). The wife can meet at noon today, and plans to be in the patient's room  Code Status/Advance Care Planning:  DNR  Palliative Prophylaxis:   Frequent Pain Assessment, Oral Care, Palliative Wound Care and Turn Reposition  Additional Recommendations (Limitations, Scope, Preferences):  Full Comfort Care  Psycho-social/Spiritual:   Desire for further Chaplaincy support:no  Additional Recommendations: Education on Hospice  Prognosis:   < 2 weeks  Discharge Planning: Hospice facility      Primary Diagnoses: Present on Admission: . Dehydration . Dementia . Essential hypertension . Hypernatremia . Malnutrition of moderate degree . Sacral decubitus ulcer, stage IV (HCC) . Acute encephalopathy . DVT (deep venous thrombosis) (HCC) . Hypokalemia   I have reviewed the medical record, interviewed the patient and family, and examined the patient. The following aspects  are pertinent.  Past Medical History:  Diagnosis Date  . Arthritis   . Asthma   . BPH (benign prostatic hyperplasia)   . Memory deficits 11/01/2014   Social History   Social History  . Marital status: Married    Spouse name: N/A  . Number of children: 2  . Years  of education: college de   Occupational History  . Retired    Social History Main Topics  . Smoking status: Former Games developer  . Smokeless tobacco: Never Used  . Alcohol use No     Comment: occasionally  . Drug use: No  . Sexual activity: Not Asked     Comment: Married   Other Topics Concern  . None   Social History Narrative   Lives at home w/ his wife   Patient is right handed.   Patient does not drink caffeine.   Family History  Problem Relation Age of Onset  . Alzheimer's disease Mother   . Alzheimer's disease Father   . Diabetes Brother   . Alzheimer's disease Sister   . Heart disease Brother   . Diabetes Brother   . Alzheimer's disease Sister   . Alzheimer's disease Brother   . Alzheimer's disease Brother    Scheduled Meds: . collagenase  1 application Topical Daily  . cyanocobalamin  500 mcg Oral Daily  . donepezil  10 mg Oral QHS  . feeding supplement (ENSURE ENLIVE)  237 mL Oral BID BM  . finasteride  5 mg Oral Daily  . fluticasone  2 spray Each Nare Daily  . memantine  10 mg Oral BID  . montelukast  10 mg Oral QHS  . multivitamin with minerals  1 tablet Oral Daily  . omega-3 acid ethyl esters  1 g Oral Daily  . pantoprazole  40 mg Oral BID AC  . protein supplement  2 oz Oral QID  . sertraline  25 mg Oral Daily  . sodium chloride flush  3 mL Intravenous Q12H  . tamsulosin  0.4 mg Oral Daily   Continuous Infusions: . dextrose 75 mL/hr at 05/05/17 1914  . heparin 1,450 Units/hr (05/06/17 1026)  . piperacillin-tazobactam (ZOSYN)  IV 3.375 g (05/06/17 1054)  . potassium chloride    . vancomycin 750 mg (05/06/17 1054)   PRN Meds:.acetaminophen **OR** acetaminophen, diclofenac sodium, ondansetron **OR** ondansetron (ZOFRAN) IV No Known Allergies   Review of Systems  Unable to perform ROS  Physical Exam  Constitutional: He appears cachectic. He has a sickly appearance.  HENT:  Head: Normocephalic and atraumatic.  Mouth/Throat: Oropharynx is clear and  moist. No oropharyngeal exudate.  Eyes: EOM are normal.  Neck: Normal range of motion.  Cardiovascular: Normal rate and regular rhythm.   Pulmonary/Chest: Effort normal.  Abdominal: Soft. Bowel sounds are decreased.  Musculoskeletal: He exhibits edema (R arm, known DVT).  UTA, pt not following commands and minimally moving in bed  Neurological: He is unresponsive.  Unresponsive in bed. Will not open eyes or respond to voice or physical stimuli.  Skin: Skin is warm and dry.  Known skin wounds, UTA d/t dressings   Vital Signs: BP 136/87 (BP Location: Left Arm)   Pulse 74   Temp 97.7 F (36.5 C) (Oral)   Resp 18   Wt 56.8 kg (125 lb 3.5 oz)   SpO2 99%   BMI 17.46 kg/m  Pain Assessment: Faces     SpO2: SpO2: 99 % O2 Device:SpO2: 99 % O2 Flow Rate: .O2 Flow Rate (L/min): 2 L/min  IO: Intake/output summary:  Intake/Output Summary (Last 24 hours) at 05/06/17 1443 Last data filed at 05/06/17 1436  Gross per 24 hour  Intake          1307.15 ml  Output                0 ml  Net          1307.15 ml    LBM: Last BM Date: 05/05/17 Baseline Weight: Weight: 72.6 kg (160 lb) (per records at [facility] in May 2018) Most recent weight: Weight: 56.8 kg (125 lb 3.5 oz)     Palliative Assessment/Data: PPS 10%    Time Total: 50 minutes Greater than 50%  of this time was spent counseling and coordinating care related to the above assessment and plan.  Signed by: Murrell Converse, NP Palliative Medicine Team Pager # [phone] (M-F 7a-5p) Team Phone # [phone] (Nights/Weekends)

## 2017-05-06 NOTE — Consult Note (Signed)
WOC Nurse wound consult note Pt is familiar to the WOC team from a previous visit; refer to notes on 4/26.   Reason for Consult:Consult requested for sacrum, right heel, and bilat hips.  Pt is very emaciated and immobile and frequently incontinent of stool. Family member in the room assessed wound appearance during the dressing change. Wound type: Sacrum with chronic stage 4 pressure injury; 10X7X1cm with 4 cm undermining to wound edges; exposed bone, 90% red. 10% yellow, mod amt tan drainage, no odor. Left ischium with unstageable pressure injury; 1X1cm, 100% tightly adhered eschar, small amt tan drainage Left hip with stage 4 pressure injury; 6X6X1cmcm with undermining to 3 cm on wound edges, exposed bone, large amt brown drainage, strong foul odor, 5 cm eschar surrounding the open wound Right hip with unstageable pressure injury; 10X6cm, 100% tightly adhered eschar. Right heel with stage 3 pressure injury; 1.5X1.8X.2cm; red and moist, mod amt tan drainage, no odor. Right outer foot with dark reddish-purple deep tissue injuries; 1X1.5 and .5X.5cm  Pressure Injury POA: Yes Dressing procedure/placement/frequency:  Air mattress to reduce pressure.  Nutrition consult to optimize protein intake.  Moist gauze packing to sacrum.  Santyl for enzymatic debridement of nonviable tissue to left ischium, left hip and right hip.  If aggressive plan of care is desired, then recommend surgical consult for possible debridement of nonviable tissue to left and right hip. Foam dressing to right heel to promote healing, float heels to reduce pressure.  Discussed with family member that wounds are necrotic and severe and discussed possible plan of care. Please re-consult if further assistance is needed.  Thank-you,  Cammie Mcgeeawn Takenya Travaglini MSN, RN, CWOCN, [place]CN-AP, CNS [phone]

## 2017-05-06 NOTE — Progress Notes (Signed)
   05/06/17 1330  Clinical Encounter Type  Visited With Patient and family together  Visit Type Other (Comment) ([place] consult)  Spiritual Encounters  Spiritual Needs Prayer  Stress Factors  Patient Stress Factors Health changes  Family Stress Factors Family relationships  Introduction to family. Pt unable to speak. Offered prayer of peace, comfort and hope.

## 2017-05-06 NOTE — Progress Notes (Signed)
Wife arrived to bedside to answer questions on behalf of the patient and nurse completed admission history.

## 2017-05-06 NOTE — Progress Notes (Signed)
Progress Note  Mr. Zeitler is unable to participate in a goals of care conversation secondary to acute encephalopathy in the setting of dementia. No family at bedside. I have left a voicemail on his wife's home phone number with my name and contact information. I also attempted to call her cell phone but was unable to leave a message.   Plan Wait to hear back from the pt's wife. I will call again in the morning and leave my contact information with the day nurse in case she presents to the hospital.   Murrell ConverseSarah Helane Briceno [facility]NP-C Palliative Care [phone]  No charge note.

## 2017-05-06 NOTE — Care Management Obs Status (Signed)
MEDICARE OBSERVATION STATUS NOTIFICATION   Patient Details  Name: Andre Ball MRN: [id number] Date of Birth: 1941-01-03   Medicare Observation Status Notification Given:  Yes    Carson Bogden, Annamarie Majorheryl U, RN 05/06/2017, 10:12 AM

## 2017-05-06 NOTE — Progress Notes (Signed)
PHARMACY - PHYSICIAN COMMUNICATION CRITICAL VALUE ALERT - BLOOD CULTURE IDENTIFICATION (BCID)  Results for orders placed or performed during the hospital encounter of 05/05/17  Blood Culture ID Panel (Reflexed) (Collected: 05/05/2017 10:00 AM)  Result Value Ref Range   Enterococcus species NOT DETECTED NOT DETECTED   Listeria monocytogenes NOT DETECTED NOT DETECTED   Staphylococcus species NOT DETECTED NOT DETECTED   Staphylococcus aureus NOT DETECTED NOT DETECTED   Streptococcus species NOT DETECTED NOT DETECTED   Streptococcus agalactiae NOT DETECTED NOT DETECTED   Streptococcus pneumoniae NOT DETECTED NOT DETECTED   Streptococcus pyogenes NOT DETECTED NOT DETECTED   Acinetobacter baumannii NOT DETECTED NOT DETECTED   Enterobacteriaceae species NOT DETECTED NOT DETECTED   Enterobacter cloacae complex NOT DETECTED NOT DETECTED   Escherichia coli NOT DETECTED NOT DETECTED   Klebsiella oxytoca NOT DETECTED NOT DETECTED   Klebsiella pneumoniae NOT DETECTED NOT DETECTED   Proteus species NOT DETECTED NOT DETECTED   Serratia marcescens NOT DETECTED NOT DETECTED   Haemophilus influenzae NOT DETECTED NOT DETECTED   Neisseria meningitidis NOT DETECTED NOT DETECTED   Pseudomonas aeruginosa NOT DETECTED NOT DETECTED   Candida albicans NOT DETECTED NOT DETECTED   Candida glabrata NOT DETECTED NOT DETECTED   Candida krusei NOT DETECTED NOT DETECTED   Candida parapsilosis NOT DETECTED NOT DETECTED   Candida tropicalis NOT DETECTED NOT DETECTED    Name of physician (or Provider) Contacted: Dr. Benjamine MolaVann (via text page)  Changes to prescribed antibiotics required: None, Continue on vanc & zosyn for  B/L hip pressure wounds/osteo   Andre Ball, Andre Ball 05/06/2017  7:31 PM

## 2017-05-07 DIAGNOSIS — L89154 Pressure ulcer of sacral region, stage 4: Secondary | ICD-10-CM

## 2017-05-07 DIAGNOSIS — E440 Moderate protein-calorie malnutrition: Secondary | ICD-10-CM

## 2017-05-07 DIAGNOSIS — E876 Hypokalemia: Secondary | ICD-10-CM

## 2017-05-07 DIAGNOSIS — Z515 Encounter for palliative care: Secondary | ICD-10-CM

## 2017-05-07 DIAGNOSIS — Z7189 Other specified counseling: Secondary | ICD-10-CM

## 2017-05-07 LAB — HEPATIC FUNCTION PANEL
ALK PHOS: 105 U/L (ref 38–126)
ALT: 65 U/L — ABNORMAL HIGH (ref 17–63)
AST: 138 U/L — ABNORMAL HIGH (ref 15–41)
Albumin: 1.5 g/dL — ABNORMAL LOW (ref 3.5–5.0)
BILIRUBIN TOTAL: 0.5 mg/dL (ref 0.3–1.2)
Total Protein: 5.7 g/dL — ABNORMAL LOW (ref 6.5–8.1)

## 2017-05-07 LAB — CBC
HEMATOCRIT: 24.6 % — AB (ref 39.0–52.0)
Hemoglobin: 7.3 g/dL — ABNORMAL LOW (ref 13.0–17.0)
MCH: 26.3 pg (ref 26.0–34.0)
MCHC: 29.7 g/dL — ABNORMAL LOW (ref 30.0–36.0)
MCV: 88.5 fL (ref 78.0–100.0)
Platelets: 279 10*3/uL (ref 150–400)
RBC: 2.78 MIL/uL — ABNORMAL LOW (ref 4.22–5.81)
RDW: 17 % — AB (ref 11.5–15.5)
WBC: 9.5 10*3/uL (ref 4.0–10.5)

## 2017-05-07 LAB — BASIC METABOLIC PANEL
Anion gap: 7 (ref 5–15)
BUN: 16 mg/dL (ref 6–20)
CHLORIDE: 118 mmol/L — AB (ref 101–111)
CO2: 28 mmol/L (ref 22–32)
CREATININE: 0.83 mg/dL (ref 0.61–1.24)
Calcium: 7.3 mg/dL — ABNORMAL LOW (ref 8.9–10.3)
GFR calc Af Amer: >60 mL/min (ref >60)
GFR calc non Af Amer: >60 mL/min (ref >60)
GLUCOSE: 121 mg/dL — AB (ref 65–99)
Potassium: 3 mmol/L — ABNORMAL LOW (ref 3.5–5.1)
SODIUM: 153 mmol/L — AB (ref 135–145)

## 2017-05-07 LAB — HEPARIN LEVEL (UNFRACTIONATED): Heparin Unfractionated: 0.28 IU/mL — ABNORMAL LOW (ref 0.30–0.70)

## 2017-05-07 LAB — TYPE AND SCREEN
ABO/RH(D): O POS
Antibody Screen: NEGATIVE

## 2017-05-07 LAB — FOLATE RBC
Folate, Hemolysate: 312.4 ng/mL
Folate, RBC: 1324 ng/mL (ref >498)
Hematocrit: 23.6 % — ABNORMAL LOW (ref 37.5–51.0)

## 2017-05-07 LAB — ABO/RH: ABO/RH(D): O POS

## 2017-05-07 MED ORDER — POLYVINYL ALCOHOL 1.4 % OP SOLN
1.0000 [drp] | Freq: Four times a day (QID) | OPHTHALMIC | Status: DC | PRN
  Filled 2017-05-07: qty 15

## 2017-05-07 MED ORDER — HALOPERIDOL LACTATE 2 MG/ML PO CONC
0.5000 mg | ORAL | Status: DC | PRN
  Filled 2017-05-07: qty 0.3

## 2017-05-07 MED ORDER — POTASSIUM CHLORIDE 10 MEQ/100ML IV SOLN
INTRAVENOUS | Status: AC
  Administered 2017-05-07: 10 meq
  Filled 2017-05-07: qty 100

## 2017-05-07 MED ORDER — POTASSIUM CHLORIDE 10 MEQ/100ML IV SOLN
10.0000 meq | INTRAVENOUS | Status: DC
  Filled 2017-05-07: qty 100

## 2017-05-07 MED ORDER — POTASSIUM CHLORIDE 10 MEQ/100ML IV SOLN
10.0000 meq | INTRAVENOUS | Status: DC
  Administered 2017-05-07 (×4): 10 meq via INTRAVENOUS

## 2017-05-07 MED ORDER — BIOTENE DRY MOUTH MT LIQD: 15.0000 mL | OROMUCOSAL | Status: DC | PRN

## 2017-05-07 MED ORDER — ENSURE ENLIVE PO LIQD: 237.0000 mL | Freq: Three times a day (TID) | ORAL | Status: DC

## 2017-05-07 MED ORDER — GLYCOPYRROLATE 0.2 MG/ML IJ SOLN
0.2000 mg | INTRAMUSCULAR | Status: DC | PRN
  Filled 2017-05-07: qty 1

## 2017-05-07 MED ORDER — PRO-STAT SUGAR FREE PO LIQD: 30.0000 mL | Freq: Two times a day (BID) | ORAL | Status: DC

## 2017-05-07 MED ORDER — VANCOMYCIN HCL IN DEXTROSE 750-5 MG/150ML-% IV SOLN
750.0000 mg | Freq: Two times a day (BID) | INTRAVENOUS | Status: DC
  Filled 2017-05-07: qty 150

## 2017-05-07 MED ORDER — MORPHINE SULFATE (PF) 2 MG/ML IV SOLN
2.0000 mg | INTRAVENOUS | Status: DC | PRN
  Administered 2017-05-07: 2 mg via INTRAVENOUS
  Filled 2017-05-07: qty 1

## 2017-05-07 MED ORDER — HALOPERIDOL LACTATE 5 MG/ML IJ SOLN: 0.5000 mg | INTRAMUSCULAR | Status: DC | PRN

## 2017-05-07 NOTE — Progress Notes (Addendum)
Initial Nutrition Assessment  DOCUMENTATION CODES:   Severe malnutrition in context of chronic illness, Underweight  INTERVENTION:   -Noted pt now transitioning to Comfort Care only; no further interventions warranted at this time.    NUTRITION DIAGNOSIS:   Malnutrition (Severe) related to  (dementia) as evidenced by percent weight loss, severe depletion of body fat, severe depletion of muscle mass.  GOAL:   Patient will meet greater than or equal to 90% of their needs  MONITOR:   PO intake, Supplement acceptance, Labs, Weight trends  REASON FOR ASSESSMENT:   Consult Wound healing (multiple pressure ulcers)  ASSESSMENT:   76 yo male admitted with acute encephalopathy, hypernatremia, multiple stage III/IV pressure ulcers. . Pt with hx of dementia, HTN, HL, osteomyelitis.   Pt obtunded on visit today, does not participate in exam. No family at bedside. Lunch tray untouched, pt currently unsafe for po intake. Noted per chart review, pt with decreased oral intake PTA  Per weight encounters, pt with significant wt loss. 21% wt loss in 3 months.   Wt Readings from Last 10 Encounters:  05/05/17 125 lb 3.5 oz (56.8 kg)  02/20/17 161 lb (73 kg)  02/04/17 158 lb 15.2 oz (72.1 kg)  11/13/16 166 lb (75.3 kg)  11/10/16 175 lb (79.4 kg)  08/20/16 166 lb (75.3 kg)  02/27/16 172 lb 9.6 oz (78.3 kg)  02/27/16 172 lb 9.6 oz (78.3 kg)  10/06/15 168 lb (76.2 kg)  08/01/15 165 lb 9.6 oz (75.1 kg)    Nutrition-Focused physical exam completed. Findings are severe fat depletion, severe muscle depletion, and no edema.   Labs: sodium 153, potassium 3.0. Albumin 1.5 Meds: MVI/minerals, D10 at 50 ml/hr, B12  Diet Order:  DIET SOFT Room service appropriate? Yes; Fluid consistency: Thin  Skin:  Wound (see comment) (stage IV hip and sacrum, stage III heel, unstageable heel/)  Last BM:  05/05/17  Height:   Ht Readings from Last 1 Encounters:  05/06/17 5\' 11"  (1.803 m)    Weight:    Wt Readings from Last 1 Encounters:  05/05/17 125 lb 3.5 oz (56.8 kg)    BMI:  Body mass index is 17.46 kg/m.  Estimated Nutritional Needs:   Kcal:  1700-1900 kcals  Protein:  86-114 g   Fluid:  >/= 1.9 L  EDUCATION NEEDS:   No education needs identified at this time  Romelle StarcherCate Rebel Laughridge MS, RD, LDN [phone] Pager  [phone] Weekend/On-Call Pager

## 2017-05-07 NOTE — Progress Notes (Signed)
PROGRESS NOTE    Andre NumberDouglas M Eckles  YNW:[id number] DOB: [birth year] DOA: 05/05/2017 PCP: Mirna MiresHill, Gerald, MD   Outpatient Specialists:     Brief Narrative:  Andre Ball is a 76 y.o. male with medical history significant for dementia, BPH, hypertension, hyperlipidemia, osteomyelitis, anemia related to recent GI bleed, decubitus of the sacrum and right heel presents to the emergency department from the facility with the chief complaint of acute encephalopathy. Initial evaluation reveals hypernatremia hypokalemia mildly elevated lactic acid and DVT of the right upper extremity.  Information is obtained from the chart as patient is awake alert but nonverbal at this time. Chart patient had a PICC line placed July 5 for vancomycin presumably related to bilateral hip wounds. Today staff noticed swelling beneath the site of that line. Associated symptoms include acute encephalopathy in that patient is nonverbal. He is demented but will usually have conversation. In addition he has had decreased oral intake. No reports of any fever nausea vomiting diarrhea. No reports of shortness of breath cough lower extremity edema. EMS was called blood pressure reportedly 90/40. He received 1 L normal saline.   Assessment & Plan:   Principal Problem:   Acute encephalopathy Active Problems:   Dehydration   Hypernatremia   Essential hypertension   Dementia   Malnutrition of moderate degree   Sacral decubitus ulcer, stage IV (HCC)   DVT (deep venous thrombosis) (HCC)   Hypokalemia   Acute encephalopathy -Likely multifactorial. Metabolic derangements related to dehydration versus infectious process. -check b12 (high)  and folate (pending) -urinalysis ok -blood culture gram + rods (suspect contaminant) -wound consult- placed on abx in ER-- had debridement last hospitalization-- if family desires intervention, will need to consult surgery for possible debridement  -CT head: Severe bitemporal lobe atrophy  associated with neurodegenerative disorders  Hypernatremia/hypokalemia.  -he has not been taking by mouth fluids/nourishment over the last 2 days or so. No noted changes in meds recently -IVF- D10 -recheck daily -Hold home Lasix for now  DVT right upper extremity. Patient with PICC line insertion 3 days ago? Ultrasound reveals acute DVT throughout the right brachial vein. PICC line surrounded by thrombus -continue heparin gtt for now  -will need long term anticoagulation -of note recently hospitalized for symptomatic anemia presumable from NSAID induced gi bleed. Evaluated by GI and endo deferred.  -monitor CBC- type and screen, check stool for blood   Anemia. Recently hospitalized for acute blood loss anemia. History heme-positive stools. Evaluated by GI presumably GI bleed related to NSAID use. He was transfused 3 units of packed RBCs. Aspirin and NSAIDs discontinued. GI deferred endoscopy at that point.  -Continue PPI -heme test stools  Hypertension -stable  Sacral osteomyelitis/wound indicate stage IV. Area debrided last hospitalization. Discharge summary dated May 2 indicates patient given vancomycin ceftriaxone and the metronidazole during last  Hospitalization. Note also indicates infectious disease was consulted and recommended a final regimen of Flagyl and Rocephin and date to be June 8 to complete a six-week course. Blood cultures were negative. Unclear to me why he got a PICC line July 5 for  vancomycin presumably recurrent infection sacral decub. -WOC consult- if family wishes for aggressive care after meeting with palliative care then will need surgical consult for debridement  Elevated LFTs -trend  Hypokalemia -replace  Palliative care consult for GOC    DVT prophylaxis:  Fully anticoagulated   Code Status: DNR   Family Communication: Spoke with wife 7/9-- does not appear to understand the severity of patient's situation  and inability to  recover  Disposition Plan:  From SNF (at baseline able to hold conversation)   Consultants:    Palliative care   Subjective: Opens eyes today  Objective: Vitals:   05/06/17 1805 05/06/17 2137 05/07/17 0425 05/07/17 0811  BP: (!) 132/43 114/62 124/73 (!) 130/53  Pulse: 99 83 79 (!) 101  Resp: 18 18 18 18   Temp: 98.9 F (37.2 C) 97.7 F (36.5 C) 98.7 F (37.1 C) 98.2 F (36.8 C)  TempSrc: Oral Oral Oral Oral  SpO2: 98% 100% 100% 100%  Weight:      Height:  5\' 11"  (1.803 m)      Intake/Output Summary (Last 24 hours) at 05/07/17 1219 Last data filed at 05/07/17 0159  Gross per 24 hour  Intake          2243.93 ml  Output                0 ml  Net          2243.93 ml   Filed Weights   05/05/17 1200 05/05/17 2046  Weight: 72.6 kg (160 lb) 56.8 kg (125 lb 3.5 oz)    Examination:  General exam: in bed, opens eyes but non-verbal Respiratory system: anteriorly no wheezing Cardiovascular system: rrr, min LE edema left leg Gastrointestinal system: +BS, thin Central nervous system: not following commands, opens eyes and tracks Extremities: not moving extremities voluntarily     Data Reviewed: I have personally reviewed following labs and imaging studies  CBC:  Recent Labs Lab 05/05/17 0959 05/05/17 2214 05/06/17 0112 05/07/17 0453  WBC 8.4 8.0 8.1 9.5  NEUTROABS 7.3  --   --   --   HGB 8.0* 9.0* 8.3* 7.3*  HCT 27.1* 30.1* 27.4* 24.6*  MCV 87.7 87.8 87.3 88.5  PLT 327 323 292 279   Basic Metabolic Panel:  Recent Labs Lab 05/05/17 1815 05/05/17 2214 05/06/17 0112 05/06/17 2047 05/07/17 0453  NA 157* 156* 155* 155* 153*  K 3.8 3.5 3.3* 5.9* 3.0*  CL 120* 118* 120* 121* 118*  CO2 25 29 27 24 28   GLUCOSE 93 127* 130* 103* 121*  BUN 25* 24* 25* 25* 16  CREATININE 1.00 0.98 0.92 1.06 0.83  CALCIUM 7.4* 7.6* 7.4* 6.8* 7.3*  MG 2.4  --   --   --   --   PHOS 3.5  --   --   --   --    GFR: Estimated Creatinine Clearance: 62.7 mL/min (by C-G formula  based on SCr of 0.83 mg/dL). Liver Function Tests:  Recent Labs Lab 05/05/17 0959 05/07/17 0453  AST 118* 138*  ALT 76* 65*  ALKPHOS 114 105  BILITOT 0.4 0.5  PROT 6.3* 5.7*  ALBUMIN 1.9* 1.5*   No results for input(s): LIPASE, AMYLASE in the last 168 hours. No results for input(s): AMMONIA in the last 168 hours. Coagulation Profile:  Recent Labs Lab 05/05/17 0959  INR 1.46   Cardiac Enzymes: No results for input(s): CKTOTAL, CKMB, CKMBINDEX, TROPONINI in the last 168 hours. BNP (last 3 results) No results for input(s): PROBNP in the last 8760 hours. HbA1C: No results for input(s): HGBA1C in the last 72 hours. CBG:  Recent Labs Lab 05/06/17 1650  GLUCAP 123*   Lipid Profile: No results for input(s): CHOL, HDL, LDLCALC, TRIG, CHOLHDL, LDLDIRECT in the last 72 hours. Thyroid Function Tests: No results for input(s): TSH, T4TOTAL, FREET4, T3FREE, THYROIDAB in the last 72 hours. Anemia Panel:  Recent Labs  05/05/17 1815  VITAMINB12 2,099*   Urine analysis:    Component Value Date/Time   COLORURINE YELLOW 05/05/2017 1652   APPEARANCEUR HAZY (A) 05/05/2017 1652   LABSPEC 1.021 05/05/2017 1652   PHURINE 5.0 05/05/2017 1652   GLUCOSEU NEGATIVE 05/05/2017 1652   HGBUR NEGATIVE 05/05/2017 1652   BILIRUBINUR NEGATIVE 05/05/2017 1652   KETONESUR NEGATIVE 05/05/2017 1652   PROTEINUR NEGATIVE 05/05/2017 1652   NITRITE NEGATIVE 05/05/2017 1652   LEUKOCYTESUR NEGATIVE 05/05/2017 1652     Recent Results (from the past 240 hour(s))  Culture, blood (Routine x 2)     Status: None (Preliminary result)   Collection Time: 05/05/17 10:00 AM  Result Value Ref Range Status   Specimen Description BLOOD RIGHT ARM  Final   Special Requests   Final    BOTTLES DRAWN AEROBIC AND ANAEROBIC Blood Culture adequate volume   Culture  Setup Time   Final    GRAM POSITIVE RODS ANAEROBIC BOTTLE ONLY Organism ID to follow CRITICAL RESULT CALLED TO, READ BACK BY AND VERIFIED WITH: Sophronia Simas PHARMD 1911 05/06/17 A BROWNING    Culture NO GROWTH 2 DAYS  Final   Report Status PENDING  Incomplete  Blood Culture ID Panel (Reflexed)     Status: None   Collection Time: 05/05/17 10:00 AM  Result Value Ref Range Status   Enterococcus species NOT DETECTED NOT DETECTED Final   Listeria monocytogenes NOT DETECTED NOT DETECTED Final   Staphylococcus species NOT DETECTED NOT DETECTED Final   Staphylococcus aureus NOT DETECTED NOT DETECTED Final   Streptococcus species NOT DETECTED NOT DETECTED Final   Streptococcus agalactiae NOT DETECTED NOT DETECTED Final   Streptococcus pneumoniae NOT DETECTED NOT DETECTED Final   Streptococcus pyogenes NOT DETECTED NOT DETECTED Final   Acinetobacter baumannii NOT DETECTED NOT DETECTED Final   Enterobacteriaceae species NOT DETECTED NOT DETECTED Final   Enterobacter cloacae complex NOT DETECTED NOT DETECTED Final   Escherichia coli NOT DETECTED NOT DETECTED Final   Klebsiella oxytoca NOT DETECTED NOT DETECTED Final   Klebsiella pneumoniae NOT DETECTED NOT DETECTED Final   Proteus species NOT DETECTED NOT DETECTED Final   Serratia marcescens NOT DETECTED NOT DETECTED Final   Haemophilus influenzae NOT DETECTED NOT DETECTED Final   Neisseria meningitidis NOT DETECTED NOT DETECTED Final   Pseudomonas aeruginosa NOT DETECTED NOT DETECTED Final   Candida albicans NOT DETECTED NOT DETECTED Final   Candida glabrata NOT DETECTED NOT DETECTED Final   Candida krusei NOT DETECTED NOT DETECTED Final   Candida parapsilosis NOT DETECTED NOT DETECTED Final   Candida tropicalis NOT DETECTED NOT DETECTED Final  Urine culture     Status: None   Collection Time: 05/05/17  4:52 PM  Result Value Ref Range Status   Specimen Description URINE, CLEAN CATCH  Final   Special Requests NONE  Final   Culture NO GROWTH  Final   Report Status 05/06/2017 FINAL  Final  Culture, blood (Routine x 2)     Status: None (Preliminary result)   Collection Time: 05/05/17  7:15 PM   Result Value Ref Range Status   Specimen Description BLOOD RIGHT HAND  Final   Special Requests IN PEDIATRIC BOTTLE Blood Culture adequate volume  Final   Culture NO GROWTH 2 DAYS  Final   Report Status PENDING  Incomplete      Anti-infectives    Start     Dose/Rate Route Frequency Ordered Stop   05/06/17 0800  vancomycin (VANCOCIN) IVPB 750 mg/150 ml premix  750 mg 150 mL/hr over 60 Minutes Intravenous Every 12 hours 05/06/17 0555     05/05/17 1800  piperacillin-tazobactam (ZOSYN) IVPB 3.375 g     3.375 g 12.5 mL/hr over 240 Minutes Intravenous Every 8 hours 05/05/17 1116     05/05/17 1030  piperacillin-tazobactam (ZOSYN) IVPB 3.375 g     3.375 g 100 mL/hr over 30 Minutes Intravenous  Once 05/05/17 1017 05/05/17 1217   05/05/17 1030  vancomycin (VANCOCIN) IVPB 1000 mg/200 mL premix     1,000 mg 200 mL/hr over 60 Minutes Intravenous  Once 05/05/17 1017 05/05/17 1227       Radiology Studies: Ct Head Wo Contrast  Result Date: 05/05/2017 CLINICAL DATA:  Altered mental status, decreased appetite. EXAM: CT HEAD WITHOUT CONTRAST TECHNIQUE: Contiguous axial images were obtained from the base of the skull through the vertex without intravenous contrast. COMPARISON:  CT HEAD January 31, 2017 and MRI of the head December 02, 2014 FINDINGS: BRAIN: Moderate to severe ventriculomegaly, disproportionate bitemporal horn enlargement on ex vacuo basis. Overall commensurate enlargement of cerebral sulci. Confluent supratentorial and pontine white matter hypodensities. 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 No intraparenchymal hemorrhage, mass effect, midline shift or acute large vascular territory infarcts. No abnormal extra-axial fluid collections. Basal cisterns are patent. VASCULAR: Mild calcific atherosclerosis of the carotid siphons. SKULL: No skull fracture. No significant scalp soft tissue swelling. SINUSES/ORBITS: Minimal secretions LEFT sphenoid sinus without air-fluid levels. Pneumatized petrous  apices. The included ocular globes and orbital contents are non-suspicious. OTHER: None. IMPRESSION: 1. No acute intracranial process. 2. 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 Findings progressed from 2016. 3. 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 Electronically Signed   By: Awilda Metro M.D.   On: 05/05/2017 16:55        Scheduled Meds: . collagenase  1 application Topical Daily  . donepezil  10 mg Oral QHS  . feeding supplement (ENSURE ENLIVE)  237 mL Oral BID BM  . finasteride  5 mg Oral Daily  . fluticasone  2 spray Each Nare Daily  . memantine  10 mg Oral BID  . montelukast  10 mg Oral QHS  . multivitamin with minerals  1 tablet Oral Daily  . omega-3 acid ethyl esters  1 g Oral Daily  . pantoprazole  40 mg Oral BID AC  . protein supplement  2 oz Oral QID  . sertraline  25 mg Oral Daily  . sodium chloride flush  3 mL Intravenous Q12H  . tamsulosin  0.4 mg Oral Daily  . vitamin B-12  500 mcg Oral Daily   Continuous Infusions: . dextrose 50 mL/hr at 05/07/17 0953  . heparin 1,550 Units/hr (05/07/17 0952)  . piperacillin-tazobactam (ZOSYN)  IV Stopped (05/07/17 0700)  . potassium chloride    . vancomycin Stopped (05/07/17 0936)     LOS: 1 day    Time spent: 25 min    Rodrickus Min U Mordche Hedglin, DO [facility] Pager [phone]  If 7PM-7AM, please contact night-coverage [email or web address] Password [email or web address] 05/07/2017, 12:19 PM

## 2017-05-07 NOTE — Progress Notes (Signed)
ANTICOAGULATION CONSULT NOTE - Follow Up Consult  Pharmacy Consult for Heparin Indication: DVT - UE thrombus after PICC line insertion  No Known Allergies  Patient Measurements: Height: 5\' 11"  (180.3 cm) Weight: 125 lb 3.5 oz (56.8 kg) IBW/kg (Calculated) : 75.3  Vital Signs: Temp: 98.2 F (36.8 C) (07/10 0811) Temp Source: Oral (07/10 0811) BP: 130/53 (07/10 0811) Pulse Rate: 101 (07/10 0811)  Labs:  Recent Labs  05/05/17 0959  05/05/17 1912 05/05/17 2214 05/06/17 0112 05/06/17 0431 05/06/17 2047 05/07/17 0453  HGB 8.0*  --   --  9.0* 8.3*  --   --  7.3*  HCT 27.1*  --   --  30.1* 27.4*  --   --  24.6*  PLT 327  --   --  323 292  --   --  279  LABPROT 17.8*  --   --   --   --   --   --   --   INR 1.46  --   --   --   --   --   --   --   HEPARINUNFRC  --   --  0.25*  --   --  0.32  --  0.28*  CREATININE 1.07  < >  --  0.98 0.92  --  1.06 0.83  < > = values in this interval not displayed.  Estimated Creatinine Clearance: 62.7 mL/min (by C-G formula based on SCr of 0.83 mg/dL).   Medications:  Heparin @ 1450 units/hr  Assessment: 75yom started on heparin for new thrombus around his PICC line in his right UE.  Has required heparin infusion increases due to low or low-normal levels. This morning, level dropped to 0.28 units/mL on 1450 units/hr.   Hgb low at 7.3, plts in range at 279- no bleeding noted.  Goal of Therapy:  Heparin level 0.3-0.7 units/ml Monitor platelets by anticoagulation protocol: Yes   Plan:  - Increase heparin to 1550 units/hr - Recheck heparin level this evening - Daily heparin level and CBC - Follow long term anticoagulation plans   Semaj Coburn D. Stefanos Haynesworth, PharmD, BCPS Clinical Pharmacist Pager: [phone] Clinical Phone for 05/07/2017 until 3:30pm: [phone] If after 3:30pm, please call main pharmacy at [phone] 05/07/2017 10:50 AM

## 2017-05-07 NOTE — Progress Notes (Signed)
Progress Note  Andre Ball is unable to participate in a goals of care conversation secondary to acute encephalopathy in the setting of dementia. No family at bedside. I have left another voicemail on his wife's home phone number with my name and contact information. I also attempted to call her cell phone but was unable to leave a message. I have left my number with the care nurse today with the request that she call me if family presents at the bedside.    Murrell ConverseSarah Caoimhe Damron AGNP-C Palliative Care [phone]  No charge note.

## 2017-05-07 NOTE — Consult Note (Signed)
   [facility]   05/07/2017  Andre Ball 10/02/1941 [id number]   Patient assessed for admissions 3 hospitalizations 3 ED in the Medicare ACO.  Noted with Dr. Mirna MiresGerald Hill as his primary care provider.  Chart reviewed and reveals the patient is admitted with hypernatremia, hypokalemia with a DVT HX of Dementia.  [facility] No family was in the room.  Noted documentation from Palliative Care.  Patient's current disposition is not determined at this time. Will follow.  For questions, please contact:  Charlesetta ShanksVictoria Travonna Swindle, RN BSN CCM [facility] Liaison  [phone] business mobile phone Toll free office [phone]

## 2017-05-07 NOTE — Progress Notes (Signed)
Progress Note  Longer initial consult note to follow. In brief, I met with Andre Ball and had her son, Terrence, on the phone. We discussed the patient;s clinical picture and options for care. They elected to pursue full comfort measures. I recommended residential hospice base don his significant wounds, infection, and that he is no longer eating or drinking. I expect he has days to a week left. His family agreed. SW consult placed for residential placement; family would like [facility]. Wife to be in the room tomorrow at noon, so I will work to coordinate this time for [facility] to meet with her.      AGNP-C Palliative Care [phone] 

## 2017-05-08 DIAGNOSIS — E440 Moderate protein-calorie malnutrition: Secondary | ICD-10-CM | POA: Diagnosis not present

## 2017-05-08 DIAGNOSIS — G9340 Encephalopathy, unspecified: Secondary | ICD-10-CM | POA: Diagnosis not present

## 2017-05-08 DIAGNOSIS — Z9181 History of falling: Secondary | ICD-10-CM | POA: Diagnosis not present

## 2017-05-08 DIAGNOSIS — Z515 Encounter for palliative care: Secondary | ICD-10-CM | POA: Diagnosis not present

## 2017-05-08 DIAGNOSIS — M6281 Muscle weakness (generalized): Secondary | ICD-10-CM | POA: Diagnosis not present

## 2017-05-08 DIAGNOSIS — R4182 Altered mental status, unspecified: Secondary | ICD-10-CM | POA: Diagnosis not present

## 2017-05-08 DIAGNOSIS — L89893 Pressure ulcer of other site, stage 3: Secondary | ICD-10-CM | POA: Diagnosis not present

## 2017-05-08 DIAGNOSIS — M4628 Osteomyelitis of vertebra, sacral and sacrococcygeal region: Secondary | ICD-10-CM | POA: Diagnosis not present

## 2017-05-08 DIAGNOSIS — R1312 Dysphagia, oropharyngeal phase: Secondary | ICD-10-CM | POA: Diagnosis not present

## 2017-05-08 DIAGNOSIS — R262 Difficulty in walking, not elsewhere classified: Secondary | ICD-10-CM | POA: Diagnosis not present

## 2017-05-08 DIAGNOSIS — N401 Enlarged prostate with lower urinary tract symptoms: Secondary | ICD-10-CM | POA: Diagnosis not present

## 2017-05-08 DIAGNOSIS — I82621 Acute embolism and thrombosis of deep veins of right upper extremity: Secondary | ICD-10-CM | POA: Diagnosis not present

## 2017-05-08 DIAGNOSIS — E785 Hyperlipidemia, unspecified: Secondary | ICD-10-CM | POA: Diagnosis not present

## 2017-05-08 DIAGNOSIS — F0390 Unspecified dementia without behavioral disturbance: Secondary | ICD-10-CM | POA: Diagnosis not present

## 2017-05-08 DIAGNOSIS — F29 Unspecified psychosis not due to a substance or known physiological condition: Secondary | ICD-10-CM | POA: Diagnosis not present

## 2017-05-08 DIAGNOSIS — L89224 Pressure ulcer of left hip, stage 4: Secondary | ICD-10-CM | POA: Diagnosis not present

## 2017-05-08 DIAGNOSIS — M79673 Pain in unspecified foot: Secondary | ICD-10-CM | POA: Diagnosis not present

## 2017-05-08 DIAGNOSIS — M6282 Rhabdomyolysis: Secondary | ICD-10-CM | POA: Diagnosis not present

## 2017-05-08 DIAGNOSIS — L89210 Pressure ulcer of right hip, unstageable: Secondary | ICD-10-CM | POA: Diagnosis not present

## 2017-05-08 DIAGNOSIS — E870 Hyperosmolality and hypernatremia: Secondary | ICD-10-CM | POA: Diagnosis not present

## 2017-05-08 DIAGNOSIS — L89154 Pressure ulcer of sacral region, stage 4: Secondary | ICD-10-CM | POA: Diagnosis not present

## 2017-05-08 DIAGNOSIS — Z7189 Other specified counseling: Secondary | ICD-10-CM

## 2017-05-08 DIAGNOSIS — R531 Weakness: Secondary | ICD-10-CM | POA: Diagnosis not present

## 2017-05-08 DIAGNOSIS — D649 Anemia, unspecified: Secondary | ICD-10-CM | POA: Diagnosis not present

## 2017-05-08 DIAGNOSIS — M25519 Pain in unspecified shoulder: Secondary | ICD-10-CM | POA: Diagnosis not present

## 2017-05-08 DIAGNOSIS — K219 Gastro-esophageal reflux disease without esophagitis: Secondary | ICD-10-CM | POA: Diagnosis not present

## 2017-05-08 DIAGNOSIS — F339 Major depressive disorder, recurrent, unspecified: Secondary | ICD-10-CM | POA: Diagnosis not present

## 2017-05-08 DIAGNOSIS — I10 Essential (primary) hypertension: Secondary | ICD-10-CM | POA: Diagnosis not present

## 2017-05-08 DIAGNOSIS — K922 Gastrointestinal hemorrhage, unspecified: Secondary | ICD-10-CM | POA: Diagnosis not present

## 2017-05-08 DIAGNOSIS — E46 Unspecified protein-calorie malnutrition: Secondary | ICD-10-CM | POA: Diagnosis not present

## 2017-05-08 DIAGNOSIS — F329 Major depressive disorder, single episode, unspecified: Secondary | ICD-10-CM | POA: Diagnosis not present

## 2017-05-08 DIAGNOSIS — G9341 Metabolic encephalopathy: Secondary | ICD-10-CM | POA: Diagnosis not present

## 2017-05-08 DIAGNOSIS — L89614 Pressure ulcer of right heel, stage 4: Secondary | ICD-10-CM | POA: Diagnosis not present

## 2017-05-08 DIAGNOSIS — N289 Disorder of kidney and ureter, unspecified: Secondary | ICD-10-CM | POA: Diagnosis not present

## 2017-05-08 MED ORDER — MORPHINE SULFATE (CONCENTRATE) 10 MG /0.5 ML PO SOLN: 10.0000 mg | ORAL | 0 refills | Status: AC | PRN

## 2017-05-08 MED ORDER — GLYCOPYRROLATE 1 MG PO TABS: 1.0000 mg | ORAL_TABLET | Freq: Three times a day (TID) | ORAL | Status: AC | PRN

## 2017-05-08 MED ORDER — ACETAMINOPHEN 650 MG RE SUPP: 650.0000 mg | Freq: Four times a day (QID) | RECTAL | 0 refills | Status: AC | PRN

## 2017-05-08 MED ORDER — BIOTENE DRY MOUTH MT LIQD: 15.0000 mL | OROMUCOSAL | Status: AC | PRN

## 2017-05-08 NOTE — Clinical Social Work Note (Signed)
Clinical Social Worker facilitated [facility]'s discharge [facility]. Discharge plans confirmed with patient's wife earlier today. Facility admissions director Clydie BraunKaren contacted regarding patient's readiness for discharge and clinicals transmitted to facility. CSW arranged ambulance transport via [facility] back to facility.  RN to call report prior to discharge.   Genelle BalVanessa Shawnya Mayor, MSW, LCSW Licensed Clinical Social Worker Clinical Social Work Department [facility] [phone]

## 2017-05-08 NOTE — Clinical Social Work Note (Signed)
Clinical Social Work Assessment  Patient Details  Name: Andre Ball MRN: [id number] Date of Birth: January 22, 1941  Date of referral:  05/07/17               Reason for consult:  End of Life/Hospice                Permission sought to share information with:  Family Supports Permission granted to share information::  No (Patient disoriented X4)  Name::     Andre Ball  Agency::     Relationship::  Wife  Contact Information:  [phone]  Housing/Transportation Living arrangements for the past 2 months:  Skilled Nursing Facility (Patient is LTC at [facility] per wife. Admitted initially for rehab in April '18, then transitioned to LTC) Source of Information:  Spouse Patient Interpreter Needed:  None Criminal Activity/Legal Involvement Pertinent to Current Situation/Hospitalization:  No - Comment as needed Significant Relationships:  Adult Children, Spouse, Other Family Members Lives with:  Facility Resident [facility]) Do you feel safe going back to the place where you live?  Yes Need for family participation in patient care:  Yes (Comment)  Care giving concerns:  Referral had been made to CSW for residential hospice and MD also consulted Palliative Care. Patient was made comfort care and the Palliative staff talked with wife and also Amy with [facility] regarding hospice services and bed availability for patient. Wife informed that [facility] currently has no bed available.  Social Worker assessment / plan:  Patient was made comfort care and the Palliative staff talked with wife and also Amy with [facility] ([facility] regarding bed available for patient. Amy with [facility] talked with wife regarding [facility] ([facility]) and informed her that no beds available.  CSW talked with wife in conference room on 6E regarding discharge disposition for patient as [facility] has no beds available (as told to CSW by Amy with Hospice). Discussion ensued and wife  agreeable to patient returning to [facility] with hospice advised and patient discharging to [facility] when a bed available. Andre Ball informed CSW of history of patient going to [facility], her applying for Medicaid for LTC and also going to an Elder Care attorney to get all of their affairs in order.   MD notified and will discharge patient today (7/11) back to [facility] with Palliative Services and an order that [facility] make referral to [facility].  Employment status:  Retired Health and safety inspector:  Armed forces operational officer, Medicaid In Early, [employer] (BCBS Federal EMP PPO) PT Recommendations:  Not assessed at this time Information / Referral to community resources:  Skilled Nursing Facility (None needed or requested as patient from [facility])  Patient/Family's Response to care:  No concerns expressed by wife regarding patient's care during hospitalization.  Patient/Family's Understanding of and Emotional Response to Diagnosis, Current Treatment, and Prognosis:  Not discussed.  Emotional Assessment Appearance:  Other (Comment Required (Did not see or talk with patient, talked with wife outside of room at her request) Attitude/Demeanor/Rapport:  Unable to Assess Affect (typically observed):  Unable to Assess Orientation:    Alcohol / Substance use:  Tobacco Use, Alcohol Use, Illicit Drugs (H&P reports that patient quit smoking and does not drink or use illicit drugs) Psych involvement (Current and /or in the community):     Discharge Needs  Concerns to be addressed:  Discharge Planning Concerns, Other (Comment Required (End of life issues) Readmission within the last 30 days:  No Current  discharge risk:  None Barriers to Discharge:  No Barriers Identified   Andre GoldmannCrawford, Andre Fong Bradley, LCSW 05/08/2017, 4:47 PM

## 2017-05-08 NOTE — Consult Note (Addendum)
   [facility] CM Inpatient Consult   05/08/2017  Raelyn NumberDouglas M Tungate [birth year] [id number] , Update:  Spoke with inpatient RNCM, Eunice BlaseDebbie, who states the patient and family are seeking a hospice facility,  [facility] will sign off, as the patient will have full care management under Hospice Care.  For questions, please contact:  Charlesetta ShanksVictoria Ruford Dudzinski, RN BSN CCM [facility] Liaison  [phone] business mobile phone Toll free office [phone]   1350:  Follow up:  [facility] per social worker notes to his Long term resident status there. No [facility] Care [facility] needs.

## 2017-05-08 NOTE — Progress Notes (Signed)
[facility]: RN visit  Received request from [place] CSW for the [facility]. Chart reviewed and spoke with family to acknowledge referral. Unfortunately [facility] is not able to offer a room today.  Family and CSW are aware. [facility] liaison will follow up with CSW and family tomorrow or sooner if room becomes available. Please do not hesitate to call with questions.  Thank you  Andre SaasMary Anne Robertson RN [facility] Liaison [phone]  All Hospital Liaisons are now on AMION.

## 2017-05-08 NOTE — Progress Notes (Addendum)
[facility], report was called,and [facility]. Family aware of transition to facility. Awaiting for transport to facility.

## 2017-05-08 NOTE — Progress Notes (Signed)
Daily Progress Note   Patient Ball: Andre Ball       Date: 05/08/2017 DOB: October 08, 1941  Age: 76 y.o. MRN#: [id number] Attending Physician: Andre Art, DO Primary Care Physician: Andre Mires, MD Admit Date: 05/05/2017  Reason for Consultation/Follow-up: Non pain symptom management, Pain control and Terminal Care  Subjective: Andre Ball is more awake this morning. He was mumbling words, however I was unable to understand them. He took one sip of apple juice, but refused any further food or fluid. No family at bedside.   Length of Stay: 2  Current Medications: Scheduled Meds:  . collagenase  1 application Topical Daily  . donepezil  10 mg Oral QHS  . feeding supplement (ENSURE ENLIVE)  237 mL Oral TID WC & HS  . feeding supplement (PRO-STAT SUGAR FREE 64)  30 mL Oral BID  . finasteride  5 mg Oral Daily  . fluticasone  2 spray Each Nare Daily  . memantine  10 mg Oral BID  . montelukast  10 mg Oral QHS  . pantoprazole  40 mg Oral BID AC  . sertraline  25 mg Oral Daily  . sodium chloride flush  3 mL Intravenous Q12H  . tamsulosin  0.4 mg Oral Daily    Continuous Infusions: . dextrose 50 mL/hr at 05/07/17 2111    PRN Meds: acetaminophen **OR** acetaminophen, antiseptic oral rinse, diclofenac sodium, glycopyrrolate, haloperidol **OR** haloperidol lactate, morphine injection, [DISCONTINUED] ondansetron **OR** ondansetron (ZOFRAN) IV, polyvinyl alcohol  Physical Exam   Constitutional: He appears cachectic. He has a sickly appearance.  HENT:  Head: Normocephalic and atraumatic.  Mouth/Throat: Oropharynx is clear and moist. No oropharyngeal exudate.  Abnormal dentition Eyes: EOM are normal.  Neck: Normal range of motion.  Cardiovascular: Normal rate and regular rhythm.   Pulmonary/Chest: Effort normal.  Abdominal: Soft.  Bowel sounds are decreased.  Musculoskeletal: He exhibits edema (R arm, known DVT).  UTA, Andre not following commands and minimally moving in bed. Bilateral feet twitching spontaneously.  Neurological: .  Awake and tracking me in the room, but not verbally responsive. Does not follow simple commands.  Skin: Skin is warm and dry.  Known skin wounds, UTA d/t dressings         Vital Signs: BP (!) 111/46   Pulse 78   Temp 98.9 F (37.2 C) (Oral)   Resp 14   Ht 5\' 11"  (1.803 m)   Wt 56.8 kg (125 lb 3.5 oz)   SpO2 99%   BMI 17.46 kg/m  SpO2: SpO2: 99 % O2 Device: O2 Device: Nasal Cannula O2 Flow Rate: O2 Flow Rate (L/min): 2 L/min  Intake/output summary:  Intake/Output Summary (Last 24 hours) at 05/08/17 0901 Last data filed at 05/08/17 0600  Gross per 24 hour  Intake          2454.58 ml  Output                0 ml  Net          2454.58 ml   LBM: Last BM Date: 05/05/17 Baseline Weight: Weight: 72.6 kg (160 lb) (per [facility] at [facility] in May 2018) Most recent weight: Weight: 56.8 kg (125 lb 3.5 oz)  Palliative Assessment/Data: PPS 10-20%     Patient Active Problem List   Diagnosis Date Noted  . Goals of care, counseling/discussion   . Palliative care by specialist   . Acute encephalopathy 05/05/2017  . DVT (deep venous thrombosis) (HCC) 05/05/2017  . Hypokalemia 05/05/2017  . Osteomyelitis of vertebra, sacral and sacrococcygeal region (HCC) 02/22/2017  . Acute blood loss anemia 02/20/2017  . Sacral decubitus ulcer, stage IV (HCC) 02/20/2017  . Fecal occult blood test positive   . Symptomatic anemia   . Rhabdomyolysis 02/01/2017  . Malnutrition of moderate degree 02/01/2017  . Pressure injury of skin 02/01/2017  . Dehydration 01/31/2017  . Hypernatremia 01/31/2017  . General weakness 01/31/2017  . Essential hypertension 01/31/2017  . Dementia 01/31/2017  . Memory deficits 11/01/2014    Palliative Care Assessment & Plan   HPI: 76 y.o. male  with past medical history  of dementia (conversational at baseline), GI bleed, BPH, and HTN. He was here 4/25-5/2 for GI bleed and treated for sacral osteomyelitis. At discharge the plan was for a six week course of antibiotics, to end 6/8. During this last admission he did meet with Palliative Care. A MOST form was filled out, which included DNR and limited additional interventions such as IVF and ABX as indicated and a time limited trial of a feeding tube. He now presents with confusion and was admitted on 05/05/2017. Work-up revealed hypernatremia, hypokalemia, mildly elevated lactic acid, and a DVT of the RUE. He is s/p PICC placement on 7/5, presumably for abx for hip wounds.   Assessment: I had a family meeting with Andre Ball's wife and son (who was on the phone) yesterday afternoon. In that meeting the decision was made to transition to full comfort measures and pursue residential hospice. As Andre Ball can be difficult to reach via the phone, and is at the hospital sporadically, I set a meeting time for her to speak with the Hospice liaison. I conveyed this meeting time to Andre Ball with [facility] this morning.   Andre Ball is more awake today, but otherwise very similar to yesterday. He had one sip of apple juice but otherwise declined any food or further sips. No evidence of pain or discomfort, with only one dose of IV morphine used yesterday evening.   Recommendations/Plan:  DNR, full comfort measures. Plan for [facility] liaison to meet with wife at the patient's room at noon today for evaluation and discussion on transitioning to residential hospice   Goals of Care and Additional Recommendations:  Limitations on Scope of Treatment: Full Comfort Care  Code Status:  DNR  Prognosis:   < 2 weeks. Andre with non-healing significant wounds, now chronically infected. Andre with progressive failure to thrive, now not eating and minimally drinking (infrequent sips). Significant electrolyte abnormalities on admission. Underlying  dementia.  Discharge Planning:  Hospice facility  Care plan was discussed with Andre's wife and son (7/11), [facility] rep Andre Ball.   Thank you for allowing the Palliative Medicine Team to assist in the care of this patient.  Total time: 15 minutes    Greater than 50%  of this time was spent counseling and coordinating care related to the above assessment and plan.  Andre ConverseSarah Gustava Berland, NP Palliative Medicine Team [phone] pager (7a-5p) Team Phone # [phone]

## 2017-05-08 NOTE — Clinical Social Work Note (Signed)
CSW received consult regarding residential hospice placement and was advised that [facility] staff would be meeting with wife a today regarding placement. After meeting with wife, Amy with [facility] informed CSW that [facility] ([facility] does not have any beds today.  CSW met with wife to discuss comfort care and alternate placement for patient since [facility] has no beds today. Patient is from [facility] and wife is fine with patient returning there until a bed with [facility] becomes available. Call made to Santiago Glad, admissions director with [facility] and patient can return to his long-term care bed. Call made to Amy and message left regarding discharge disposition for patient.  Asaf Elmquist Givens, MSW, LCSW Licensed Clinical Social Worker [place] [phone]

## 2017-05-08 NOTE — [place] FL2 (Signed)
[place] MEDICA[place] FL2 LEVEL OF CARE SCREENING TOOL     IDENTIFICATION  Patient Name: Andre NumberDouglas M Ball Birthdate: October 29, 1941 Sex: male Admission Date (Current Location): 05/05/2017  [facility] [place]  (Patient has Medicaid number per wife) Facility and Address[facility]  [facility], [address], [place], [place] [address]      Provider Number: [id number]  Attending Physician Name and Address:  Joseph ArtVann, Jessica U, DO  Relative Name and Phone Number:  Delora FuelBarbara Boan - wife; [phone] (mobile) and [phone] (home)    Current Level of Care: Hospital Recommended Level of Care: Skilled Nursing Facility ([facility] Prior Approval Number:    Date Approved/Denied:   PASRR Number: [id number] [id number] (Eff.02/01/17)  Discharge Plan: SNF [facility]HC)    Current Diagnoses: Patient Active Problem List   Diagnosis Date Noted  . Goals of care, counseling/discussion   . Palliative care by specialist   . Acute encephalopathy 05/05/2017  . DVT (deep venous thrombosis) (HCC) 05/05/2017  . Hypokalemia 05/05/2017  . Osteomyelitis of vertebra, sacral and sacrococcygeal region (HCC) 02/22/2017  . Acute blood loss anemia 02/20/2017  . Sacral decubitus ulcer, stage IV (HCC) 02/20/2017  . Fecal occult blood test positive   . Symptomatic anemia   . Rhabdomyolysis 02/01/2017  . Malnutrition of moderate degree 02/01/2017  . Pressure injury of skin 02/01/2017  . Dehydration 01/31/2017  . Hypernatremia 01/31/2017  . General weakness 01/31/2017  . Essential hypertension 01/31/2017  . Dementia 01/31/2017  . Memory deficits 11/01/2014    Orientation RESPIRATION BLADDER Height & Weight      (Patient disoriented X4)  O2 (2 Liters oxygen) Incontinent (External urinary catheter placed ) Weight: 125 lb 3.5 oz (56.8 kg) Height:  5\' 11"  (180.3 cm)  BEHAVIORAL SYMPTOMS/MOOD NEUROLOGICAL BOWEL NUTRITION STATUS      Incontinent Diet (Soft)   AMBULATORY STATUS COMMUNICATION OF NEEDS Skin   Total Care   Other (Comment) (Stage IV pressure injury to left hip; Deep tissure injury to right foot; pressure injury to Ischial tuberosity (left) unstageable; Unstageable pressure injury to right hip; Stage 5 pressure injury to mid sacrum; Stage 3 pressure injury to right heel)                       Personal Care Assistance Level of Assistance  Bathing, Feeding, Dressing Bathing Assistance: Maximum assistance Feeding assistance: Maximum assistance Dressing Assistance: Maximum assistance     Functional Limitations Info  Sight, Hearing, Speech Sight Info: Adequate Hearing Info: Adequate Speech Info: Adequate    SPECIAL CARE FACTORS FREQUENCY                       Contractures Contractures Info: Not present    Additional Factors Info  Code Status, Allergies Code Status Info: DNR Allergies Info: No known allergies           Current Medications (05/08/2017):  This is the current hospital active medication list Current Facility-Administered Medications  Medication Dose Route Frequency Provider Last Rate Last Dose  . acetaminophen (TYLENOL) tablet 650 mg  650 mg Oral Q6H PRN Gwenyth BenderBlack, Karen M, NP       Or  . acetaminophen (TYLENOL) suppository 650 mg  650 mg Rectal Q6H PRN Gwenyth BenderBlack, Karen M, NP      . antiseptic oral rinse (BIOTENE) solution 15 mL  15 mL Topical PRN Shea Evansunn, Theresia LoSarah Elizabeth, NP      . collagenase Melburn Popper(SANTYL)  ointment 1 application  1 application Topical Daily Marlin Canary U, DO   1 application at 05/07/17 1400  . dextrose 10 % infusion   Intravenous Continuous Ranae Palms, NP 10 mL/hr at 05/08/17 [phone]    . diclofenac sodium (VOLTAREN) 1 % transdermal gel 1 application  1 application Topical Daily PRN Black, Karen M, NP      . donepezil (ARICEPT) tablet 10 mg  10 mg Oral QHS Gwenyth Bender, NP   10 mg at 05/05/17 2159  . feeding supplement (ENSURE ENLIVE) (ENSURE ENLIVE) liquid 237 mL  237 mL Oral TID WC  & HS Vann, Jessica U, DO      . feeding supplement (PRO-STAT SUGAR FREE 64) liquid 30 mL  30 mL Oral BID Vann, Jessica U, DO      . finasteride (PROSCAR) tablet 5 mg  5 mg Oral Daily Gwenyth Bender, NP   5 mg at 05/06/17 1740  . fluticasone (FLONASE) 50 MCG/ACT nasal spray 2 spray  2 spray Each Nare Daily Gwenyth Bender, NP   2 spray at 05/08/17 1108  . glycopyrrolate (ROBINUL) injection 0.2 mg  0.2 mg Intravenous Q4H PRN Ranae Palms, NP      . haloperidol (HALDOL) 2 MG/ML solution 0.5 mg  0.5 mg Sublingual Q4H PRN Ranae Palms, NP       Or  . haloperidol lactate (HALDOL) injection 0.5 mg  0.5 mg Intravenous Q4H PRN Ranae Palms, NP      . memantine [facility]) tablet 10 mg  10 mg Oral BID Gwenyth Bender, NP   10 mg at 05/06/17 1100  . montelukast (SINGULAIR) tablet 10 mg  10 mg Oral QHS Gwenyth Bender, NP   10 mg at 05/05/17 2159  . morphine 2 MG/ML injection 2 mg  2 mg Intravenous Q2H PRN Ranae Palms, NP   2 mg at 05/07/17 2111  . ondansetron (ZOFRAN) injection 4 mg  4 mg Intravenous Q6H PRN Black, Karen M, NP      . pantoprazole (PROTONIX) EC tablet 40 mg  40 mg Oral BID AC Gwenyth Bender, NP   40 mg at 05/06/17 1740  . polyvinyl alcohol (LIQUIFILM TEARS) 1.4 % ophthalmic solution 1 drop  1 drop Both Eyes QID PRN Ranae Palms, NP      . sertraline (ZOLOFT) tablet 25 mg  25 mg Oral Daily Gwenyth Bender, NP   25 mg at 05/06/17 1101  . sodium chloride flush (NS) 0.9 % injection 3 mL  3 mL Intravenous Q12H Black, Karen M, NP   3 mL at 05/06/17 1118  . tamsulosin (FLOMAX) capsule 0.4 mg  0.4 mg Oral Daily Gwenyth Bender, NP   0.4 mg at 05/05/17 2015     Discharge Medications: Please see discharge summary for a list of discharge medications.  Relevant Imaging Results:  Relevant Lab Results:   Additional Information ss#[id number]. Additional information regarding skin: Cracking to left heel; weeping skin on left/right buttocks treated with  gauze.  Cristobal Goldmann, LCSW

## 2017-05-08 NOTE — Discharge Summary (Addendum)
Physician Discharge Summary  ADD DINAPOLI ZOX:[id number] DOB: 01-Feb-1941 DOA: 05/05/2017  PCP: Mirna Mires, MD  Admit date: 05/05/2017 Discharge date: 05/08/2017   Recommendations for Outpatient Follow-Up:   1. Comfort care-- facility to make a referral to [facility] for transition to residential-- palliatve to follow at SNF in the meantime   Discharge Diagnosis:   Principal Problem:   Acute encephalopathy Active Problems:   Dehydration   Hypernatremia   Essential hypertension   Dementia   Malnutrition of moderate degree   Sacral decubitus ulcer, stage IV (HCC)   DVT (deep venous thrombosis) (HCC)   Hypokalemia   Goals of care, counseling/discussion   Palliative care by specialist   Discharge disposition:  SNF  Discharge Condition: terminal.  Diet recommendation: as tolerated  Wound care: None.   History of Present Illness:   Andre Ball is a 76 y.o. male with medical history significant for dementia, BPH, hypertension, hyperlipidemia, osteomyelitis, anemia related to recent GI bleed, decubitus of the sacrum and right heel presents to the emergency department from the facility with the chief complaint of acute encephalopathy. Initial evaluation reveals hypernatremia hypokalemia mildly elevated lactic acid and DVT of the right upper extremity.   Hospital Course by Problem:   Acute encephalopathy -Likely multifactorial. Metabolic derangements related to dehydration versus infectious process. -now comfort focus  Hypernatremia/hypokalemia.  -now comfort focus/measures  DVT right upper extremity. Patient with PICC line insertion 3 days ago? Ultrasound reveals acute DVT throughout the right brachial vein. PICC line surrounded by thrombus -now comfort focused   Anemia.  -comfort focus  Hypertension -comfort measures  Sacral osteomyelitis/wound indicate stage IV. Area debrided last hospitalization.  -family does not wish for further  intervention, now comfort focus  Elevated LFTs -comfort focus     Medical Consultants:    Palliative care   Discharge Exam:   Vitals:   05/07/17 2055 05/08/17 1045  BP: (!) 111/46 (!) 117/56  Pulse: 78 71  Resp: 14 16  Temp: 98.9 F (37.2 C) 98.6 F (37 C)   Vitals:   05/07/17 0811 05/07/17 1701 05/07/17 2055 05/08/17 1045  BP: (!) 130/53 122/61 (!) 111/46 (!) 117/56  Pulse: (!) 101 72 78 71  Resp: 18 17 14 16   Temp: 98.2 F (36.8 C) 98.2 F (36.8 C) 98.9 F (37.2 C) 98.6 F (37 C)  TempSrc: Oral Oral Oral Oral  SpO2: 100% 99% 99% 97%  Weight:      Height:        Gen:  Resting, unresponsive to my voice   The results of significant diagnostics from this hospitalization (including imaging, microbiology, ancillary and laboratory) are listed below for reference.     Procedures and Diagnostic Studies:   Ct Head Wo Contrast  Result Date: 05/05/2017 CLINICAL DATA:  Altered mental status, decreased appetite. EXAM: CT HEAD WITHOUT CONTRAST TECHNIQUE: Contiguous axial images were obtained from the base of the skull through the vertex without intravenous contrast. COMPARISON:  CT HEAD January 31, 2017 and MRI of the head December 02, 2014 FINDINGS: BRAIN: Moderate to severe ventriculomegaly, disproportionate bitemporal horn enlargement on ex vacuo basis. Overall commensurate enlargement of cerebral sulci. Confluent supratentorial and pontine white matter hypodensities. 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 No intraparenchymal hemorrhage, mass effect, midline shift or acute large vascular territory infarcts. No abnormal extra-axial fluid collections. Basal cisterns are patent. VASCULAR: Mild calcific atherosclerosis of the carotid siphons. SKULL: No skull fracture. No significant scalp soft tissue swelling. SINUSES/ORBITS: Minimal secretions LEFT  sphenoid sinus without air-fluid levels. Pneumatized petrous apices. The included ocular globes and orbital contents are non-suspicious.  OTHER: None. IMPRESSION: 1. No acute intracranial process. 2. 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 Findings progressed from 2016. 3. 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 Electronically Signed   By: Awilda Metroourtnay  Bloomer M.D.   On: 05/05/2017 16:55   Dg Chest Portable 1 View  Result Date: 05/05/2017 CLINICAL DATA:  Patient with history of sepsis. EXAM: PORTABLE CHEST 1 VIEW COMPARISON:  Chest radiograph 02/20/2017 FINDINGS: Monitoring leads overlie the patient. Patient is rotated. Normal cardiac and mediastinal contours. Aortic vascular calcifications. Heterogeneous opacities lung bases bilaterally. No pleural effusion or pneumothorax. IMPRESSION: Basilar heterogeneous opacities may represent atelectasis or infection. Aortic atherosclerosis. Electronically Signed   By: Annia Beltrew  Davis M.D.   On: 05/05/2017 10:05     Labs:   Basic Metabolic Panel:  Recent Labs Lab 05/05/17 1815 05/05/17 2214 05/06/17 0112 05/06/17 2047 05/07/17 0453  NA 157* 156* 155* 155* 153*  K 3.8 3.5 3.3* 5.9* 3.0*  CL 120* 118* 120* 121* 118*  CO2 25 29 27 24 28   GLUCOSE 93 127* 130* 103* 121*  BUN 25* 24* 25* 25* 16  CREATININE 1.00 0.98 0.92 1.06 0.83  CALCIUM 7.4* 7.6* 7.4* 6.8* 7.3*  MG 2.4  --   --   --   --   PHOS 3.5  --   --   --   --    GFR Estimated Creatinine Clearance: 62.7 mL/min (by C-G formula based on SCr of 0.83 mg/dL). Liver Function Tests:  Recent Labs Lab 05/05/17 0959 05/07/17 0453  AST 118* 138*  ALT 76* 65*  ALKPHOS 114 105  BILITOT 0.4 0.5  PROT 6.3* 5.7*  ALBUMIN 1.9* 1.5*   No results for input(s): LIPASE, AMYLASE in the last 168 hours. No results for input(s): AMMONIA in the last 168 hours. Coagulation profile  Recent Labs Lab 05/05/17 0959  INR 1.46    CBC:  Recent Labs Lab 05/05/17 0959  05/05/17 1912 05/05/17 2214 05/06/17 0112 05/07/17 0453  WBC 8.4  --  8.0 8.1 9.5  NEUTROABS 7.3  --   --   --   --   HGB 8.0*  --  9.0* 8.3* 7.3*  HCT 27.1* 23.6* 30.1* 27.4* 24.6*  MCV 87.7  --  87.8 87.3 88.5  PLT 327  --  323 292 279   Cardiac Enzymes: No results for input(s): CKTOTAL, CKMB, CKMBINDEX, TROPONINI in the last 168 hours. BNP: Invalid input(s): POCBNP CBG:  Recent Labs Lab 05/06/17 1650  GLUCAP 123*   D-Dimer No results for input(s): DDIMER in the last 72 hours. Hgb A1c No results for input(s): HGBA1C in the last 72 hours. Lipid Profile No results for input(s): CHOL, HDL, LDLCALC, TRIG, CHOLHDL, LDLDIRECT in the last 72 hours. Thyroid function studies No results for input(s): TSH, T4TOTAL, T3FREE, THYROIDAB in the last 72 hours.  Invalid input(s): FREET3 Anemia work up  Recent Labs  05/05/17 1815  VITAMINB12 2,099*   Microbiology Recent Results (from the past 240 hour(s))  Culture, blood (Routine x 2)     Status: None (Preliminary result)   Collection Time: 05/05/17 10:00 AM  Result Value Ref Range Status   Specimen Description BLOOD RIGHT ARM  Final   Special Requests   Final    BOTTLES DRAWN AEROBIC AND ANAEROBIC Blood Culture  adequate volume   Culture  Setup Time   Final    GRAM POSITIVE RODS ANAEROBIC BOTTLE ONLY Organism ID to follow CRITICAL RESULT CALLED TO, READ BACK BY AND VERIFIED WITH: Sophronia Simas PHARMD 1911 05/06/17 A BROWNING    Culture GRAM POSITIVE RODS  Final   Report Status PENDING  Incomplete  Blood Culture ID Panel (Reflexed)     Status: None   Collection Time: 05/05/17 10:00 AM  Result Value Ref Range Status   Enterococcus species NOT DETECTED NOT DETECTED Final   Listeria monocytogenes NOT DETECTED NOT DETECTED Final   Staphylococcus species NOT DETECTED NOT DETECTED Final   Staphylococcus aureus NOT DETECTED NOT DETECTED Final   Streptococcus species NOT DETECTED NOT DETECTED Final   Streptococcus agalactiae NOT DETECTED  NOT DETECTED Final   Streptococcus pneumoniae NOT DETECTED NOT DETECTED Final   Streptococcus pyogenes NOT DETECTED NOT DETECTED Final   Acinetobacter baumannii NOT DETECTED NOT DETECTED Final   Enterobacteriaceae species NOT DETECTED NOT DETECTED Final   Enterobacter cloacae complex NOT DETECTED NOT DETECTED Final   Escherichia coli NOT DETECTED NOT DETECTED Final   Klebsiella oxytoca NOT DETECTED NOT DETECTED Final   Klebsiella pneumoniae NOT DETECTED NOT DETECTED Final   Proteus species NOT DETECTED NOT DETECTED Final   Serratia marcescens NOT DETECTED NOT DETECTED Final   Haemophilus influenzae NOT DETECTED NOT DETECTED Final   Neisseria meningitidis NOT DETECTED NOT DETECTED Final   Pseudomonas aeruginosa NOT DETECTED NOT DETECTED Final   Candida albicans NOT DETECTED NOT DETECTED Final   Candida glabrata NOT DETECTED NOT DETECTED Final   Candida krusei NOT DETECTED NOT DETECTED Final   Candida parapsilosis NOT DETECTED NOT DETECTED Final   Candida tropicalis NOT DETECTED NOT DETECTED Final  Urine culture     Status: None   Collection Time: 05/05/17  4:52 PM  Result Value Ref Range Status   Specimen Description URINE, CLEAN CATCH  Final   Special Requests NONE  Final   Culture NO GROWTH  Final   Report Status 05/06/2017 FINAL  Final  Culture, blood (Routine x 2)     Status: None (Preliminary result)   Collection Time: 05/05/17  7:15 PM  Result Value Ref Range Status   Specimen Description BLOOD RIGHT HAND  Final   Special Requests IN PEDIATRIC BOTTLE Blood Culture adequate volume  Final   Culture NO GROWTH 3 DAYS  Final   Report Status PENDING  Incomplete     Discharge Instructions:   Discharge Instructions    Discharge instructions    Complete by:  As directed    Comfort measures, facility to make a referral to [facility]   Increase activity slowly    Complete by:  As directed      Allergies as of 05/08/2017   No Known Allergies     Medication List    STOP  taking these medications   acetaminophen 325 MG tablet Commonly known as:  TYLENOL Replaced by:  acetaminophen 650 MG suppository   ceFEPime IVPB Commonly known as:  MAXIPIME   collagenase ointment Commonly known as:  SANTYL   donepezil 10 MG tablet Commonly known as:  ARICEPT   furosemide 20 MG tablet Commonly known as:  LASIX   memantine 10 MG tablet Commonly known as:  NAMENDA   methylphenidate 10 MG tablet Commonly known as:  RITALIN   montelukast 10 MG tablet Commonly known as:  SINGULAIR   multivitamin with minerals Tabs tablet   NITROSTAT 0.4 MG  SL tablet Generic drug:  nitroGLYCERIN   omega-3 acid ethyl esters 1 g capsule Commonly known as:  LOVAZA   oxyCODONE-acetaminophen 5-325 MG tablet Commonly known as:  PERCOCET/ROXICET   pantoprazole 40 MG tablet Commonly known as:  PROTONIX   protein supplement Powd Commonly known as:  UNJURY CHICKEN SOUP   ROBITUSSIN CHEST CONGESTION 100 MG/5ML syrup Generic drug:  guaifenesin   sertraline 25 MG tablet Commonly known as:  ZOLOFT   sodium hypochlorite 0.125 % Soln Commonly known as:  DAKIN'S 1/4 STRENGTH   VANCOMYCIN HCL IV     TAKE these medications   acetaminophen 650 MG suppository Commonly known as:  TYLENOL Place 1 suppository (650 mg total) rectally every 6 (six) hours as needed for mild pain (or Fever >/= 101). Replaces:  acetaminophen 325 MG tablet   antiseptic oral rinse Liqd Apply 15 mLs topically as needed for dry mouth.   diclofenac sodium 1 % Gel Commonly known as:  VOLTAREN Apply 1 application topically daily as needed (for leg/shoulder pain).   ENSURE PLUS Liqd Take 237 mLs by mouth 2 (two) times daily between meals. What changed:  Another medication with the same name was removed. Continue taking this medication, and follow the directions you see here.   feeding supplement (PRO-STAT SUGAR FREE 64) Liqd Take 30 mLs by mouth 2 (two) times daily.   finasteride 5 MG  tablet Commonly known as:  PROSCAR Take 5 mg by mouth daily.   fluticasone 50 MCG/ACT nasal spray Commonly known as:  FLONASE Place 2 sprays into the nose daily.   glycopyrrolate 1 MG tablet Commonly known as:  ROBINUL Take 1 tablet (1 mg total) by mouth 3 (three) times daily as needed (secreations).   morphine CONCENTRATE 10 mg / 0.5 ml concentrated solution Take 0.5 mLs (10 mg total) by mouth every 2 (two) hours as needed for severe pain or shortness of breath.   tamsulosin 0.4 MG Caps capsule Commonly known as:  FLOMAX Take 0.4 mg by mouth daily.         Time coordinating discharge: 35 min  Signed:  Arthurine Oleary U Melah Ebling   [facility] 05/08/2017, 1:59 PM

## 2017-05-09 DIAGNOSIS — L89210 Pressure ulcer of right hip, unstageable: Secondary | ICD-10-CM | POA: Diagnosis not present

## 2017-05-09 DIAGNOSIS — L89224 Pressure ulcer of left hip, stage 4: Secondary | ICD-10-CM | POA: Diagnosis not present

## 2017-05-09 LAB — CULTURE, BLOOD (ROUTINE X 2): Special Requests: ADEQUATE

## 2017-05-10 LAB — CULTURE, BLOOD (ROUTINE X 2)
Culture: NO GROWTH
Special Requests: ADEQUATE

## 2017-05-13 DIAGNOSIS — R531 Weakness: Secondary | ICD-10-CM | POA: Diagnosis not present

## 2017-05-29 DEATH — deceased

## 2017-07-27 IMAGING — DX DG CHEST 1V PORT
1 series · 1 of 1 positions shown · non-contrast
Comparison: Chest radiograph 02/20/2017

CLINICAL DATA: Patient with history of sepsis.

EXAM:
PORTABLE CHEST 1 VIEW

[chest]
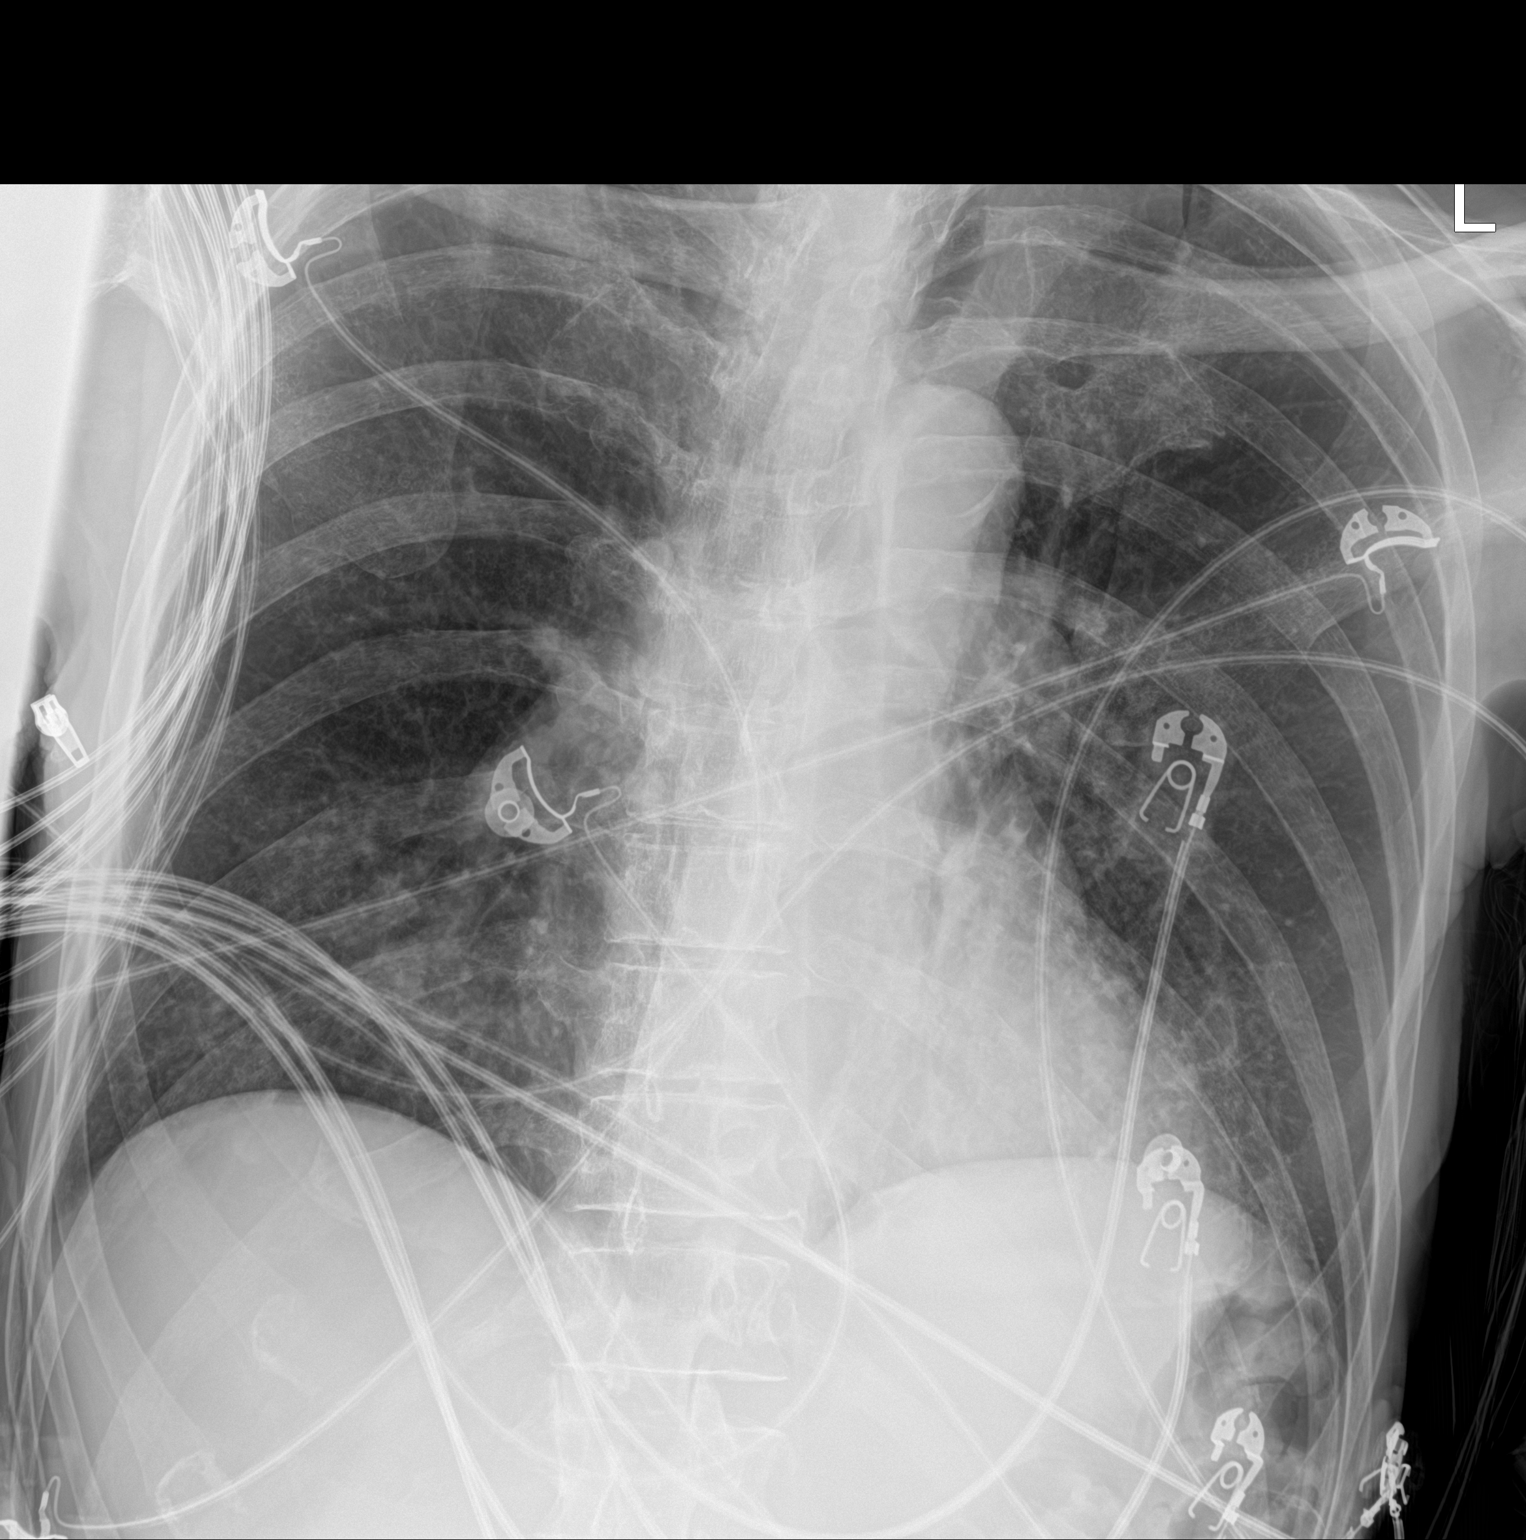

[1 of 1 positions shown; findings below may reference images not displayed]

FINDINGS: Monitoring leads overlie the patient. Patient is rotated. Normal
cardiac and mediastinal contours. Aortic vascular calcifications.
Heterogeneous opacities lung bases bilaterally. No pleural effusion
or pneumothorax.
IMPRESSION: Basilar heterogeneous opacities may represent atelectasis or
infection.

Aortic atherosclerosis.

## 2017-07-27 IMAGING — CT CT HEAD W/O CM
4 series · 16 of 47 positions shown, 18 images · non-contrast
Comparison: CT HEAD January 31, 2017 and MRI of the head December 02, 2014

CLINICAL DATA: Altered mental status, decreased appetite.

EXAM:
CT HEAD WITHOUT CONTRAST
TECHNIQUE: Contiguous axial images were obtained from the base of the skull
through the vertex without intravenous contrast.

[Series 3: head without · axial · non-contrast · 0.42mm/px · z∈[-60,+55]mm · 7 of 31 slices shown, 9 images]
[im 4/31  brain]
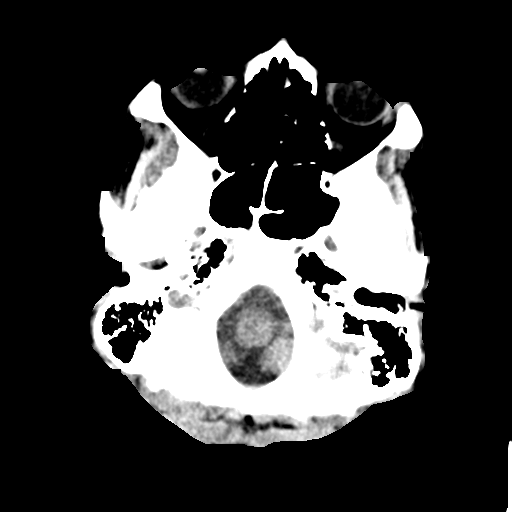
[im 4/31  bone]
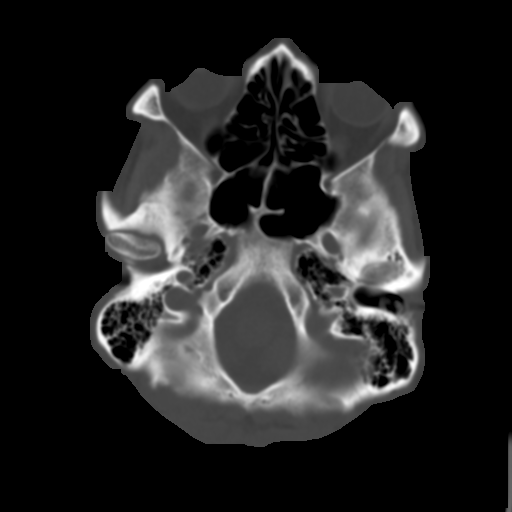
[im 8/31  brain]
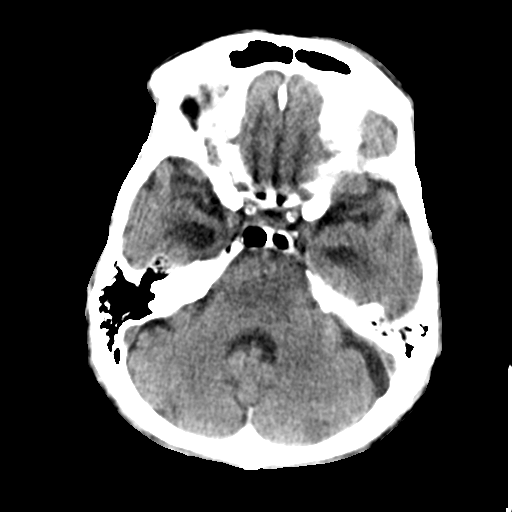
[im 12/31  brain]
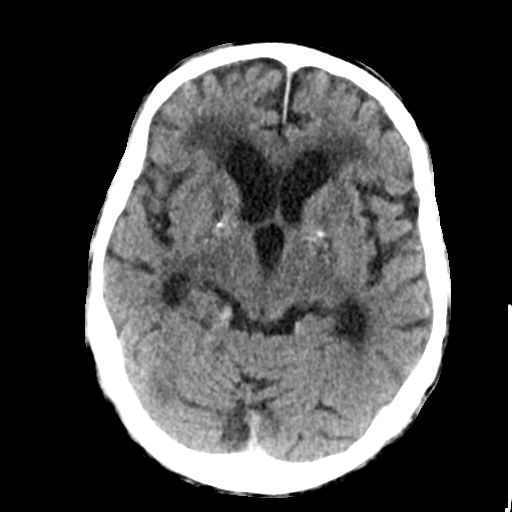
[im 16/31  brain]
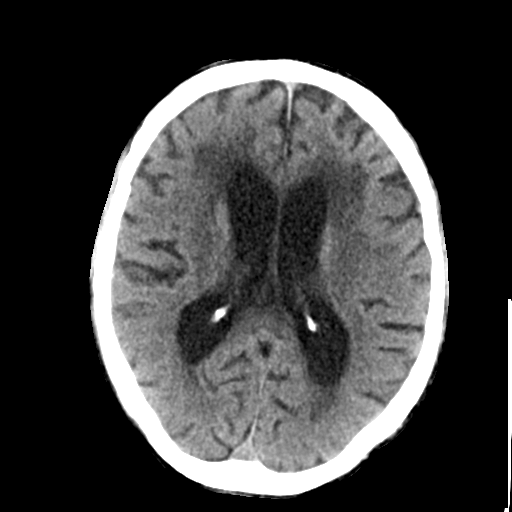
[im 19/31  brain]
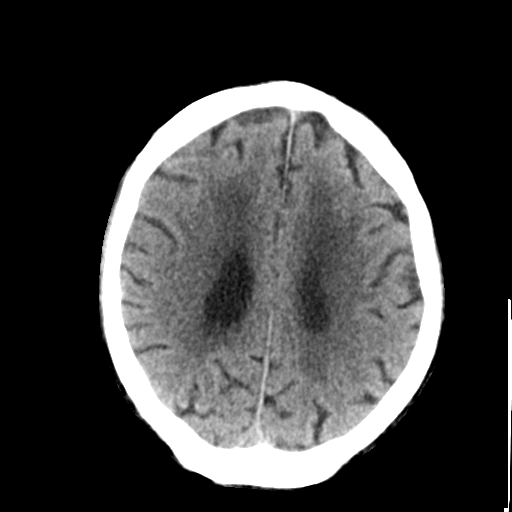
[im 19/31  bone]
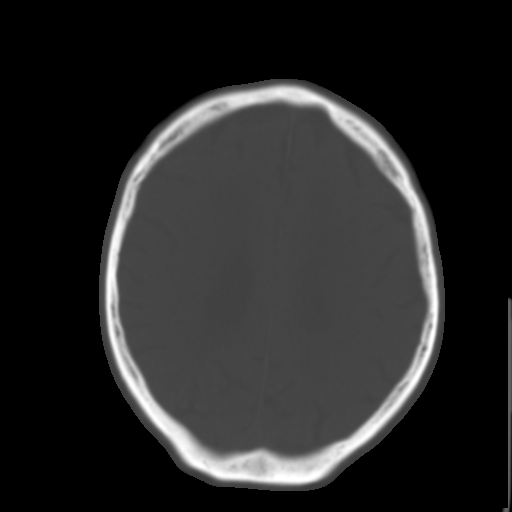
[im 23/31  brain]
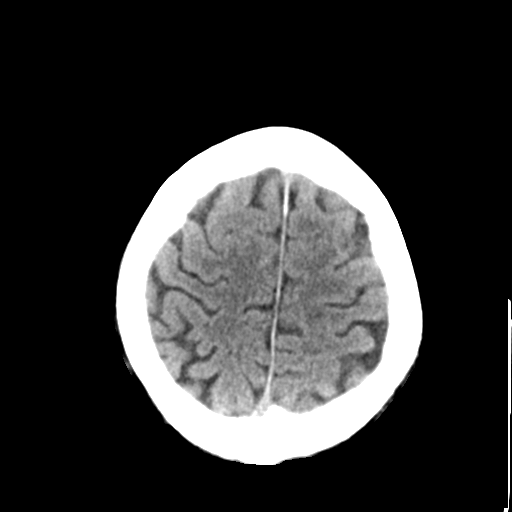
[im 27/31  brain]
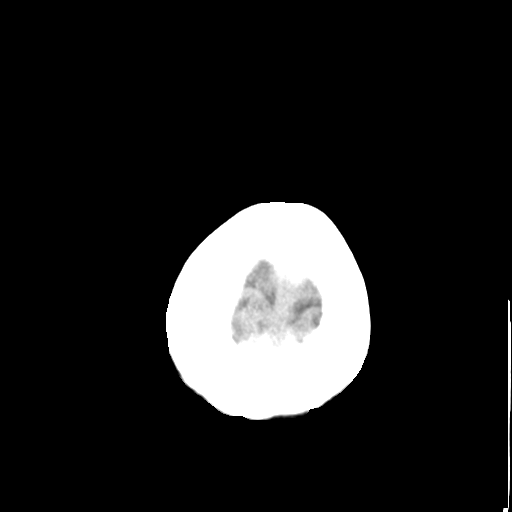

[Series 4: head bone · axial · 0.42mm/px · z∈[-61,-31]mm · 3 of 75 slices shown]
[im 8/75  bone]
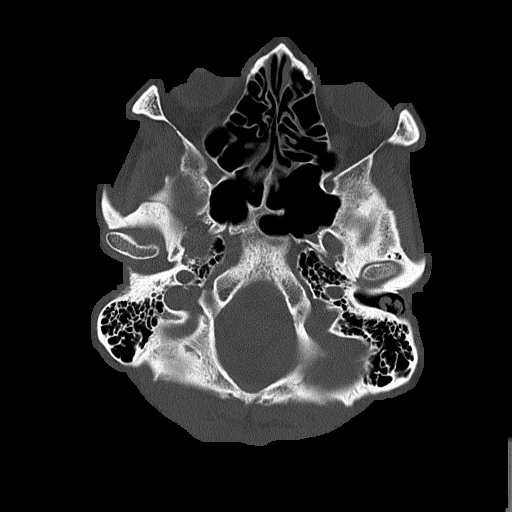
[im 15/75  bone]
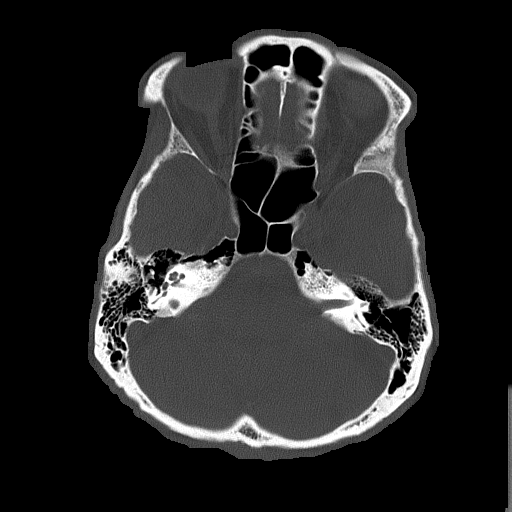
[im 23/75  bone]
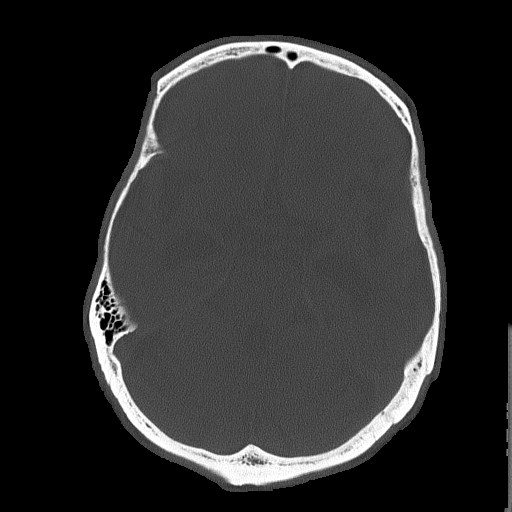

[Series 5: head without cor · coronal · non-contrast · 0.30mm/px · 3 of 67 slices shown]
[im 23/67  brain]
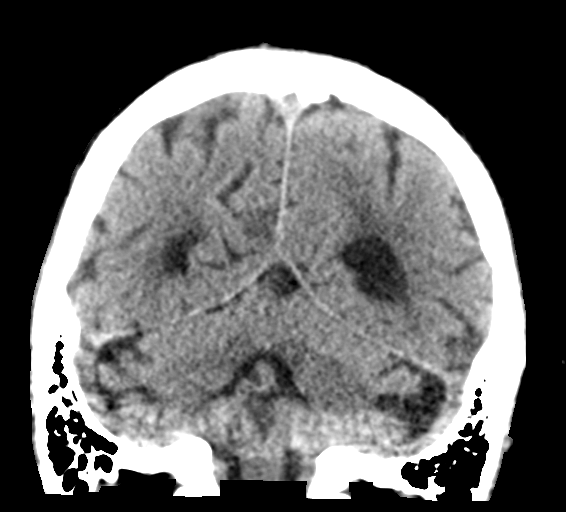
[im 30/67  brain]
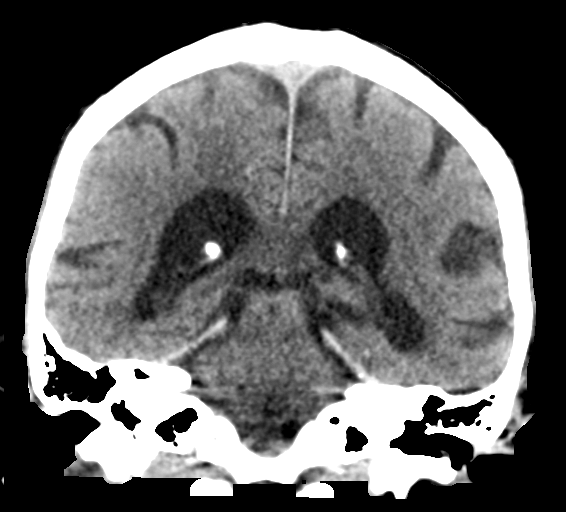
[im 37/67  brain]
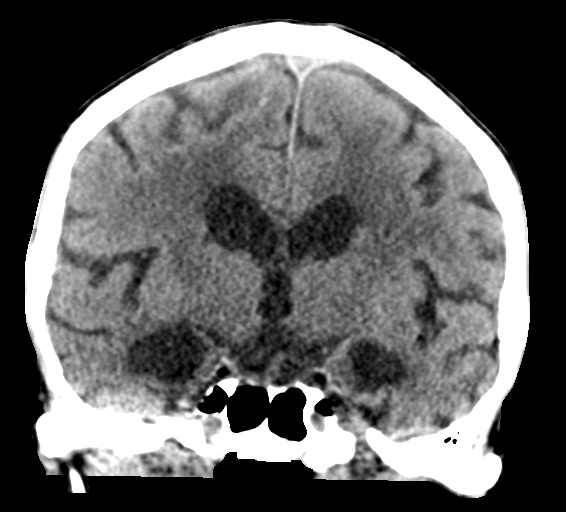

[Series 6: head without sag · sagittal · non-contrast · 0.30mm/px · 3 of 60 slices shown]
[im 20/60  brain]
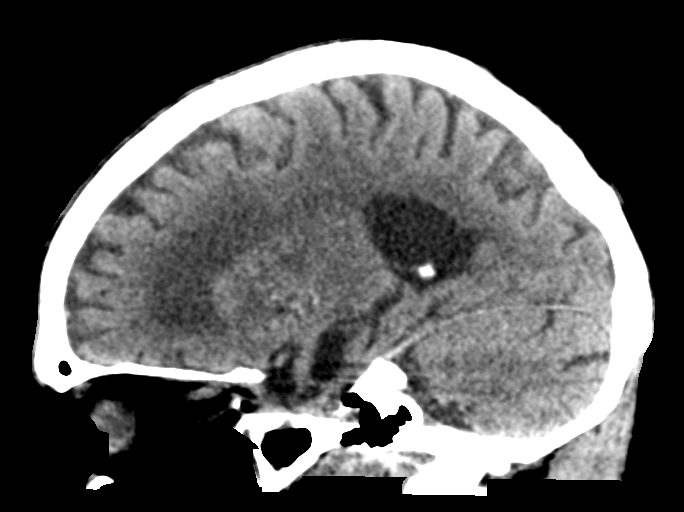
[im 30/60  brain]
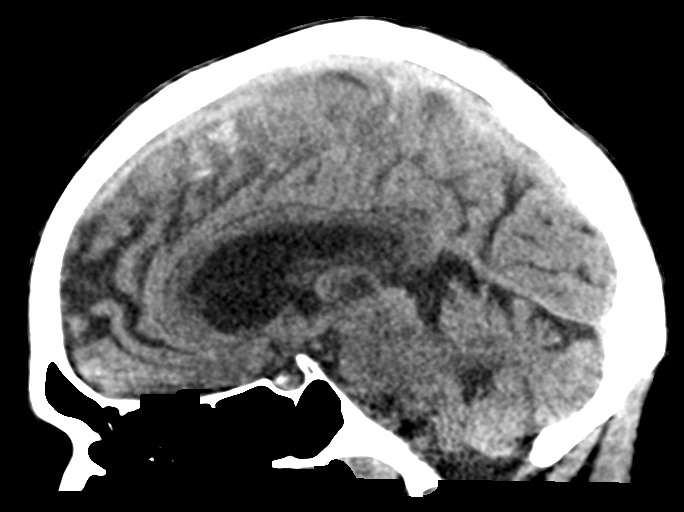
[im 40/60  brain]
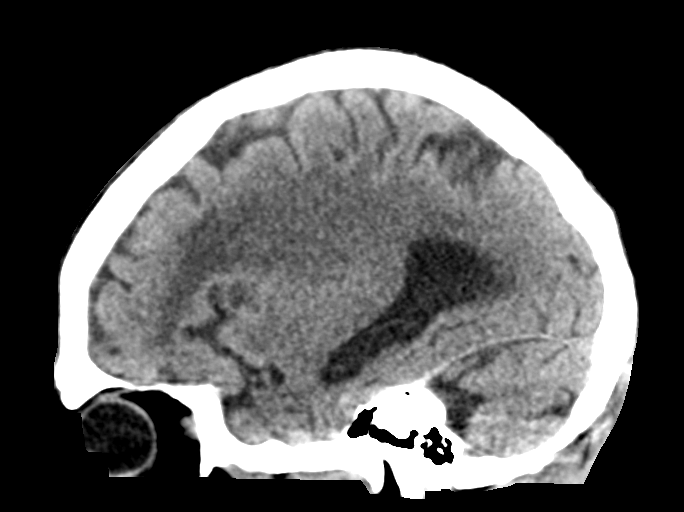

[16 of 47 positions shown; findings below may reference images not displayed]

FINDINGS: BRAIN: Moderate to severe ventriculomegaly, disproportionate
bitemporal horn enlargement on ex vacuo basis. Overall commensurate
enlargement of cerebral sulci. Confluent supratentorial and pontine
white matter hypodensities. Old basal ganglia lacunar infarcts. No
intraparenchymal hemorrhage, mass effect, midline shift or acute
large vascular territory infarcts. No abnormal extra-axial fluid
collections. Basal cisterns are patent.

VASCULAR: Mild calcific atherosclerosis of the carotid siphons.

SKULL: No skull fracture. No significant scalp soft tissue swelling.

SINUSES/ORBITS: Minimal secretions LEFT sphenoid sinus without
air-fluid levels. Pneumatized petrous apices. The included ocular
globes and orbital contents are non-suspicious.

OTHER: None.
IMPRESSION: 1. No acute intracranial process.
2. Severe bitemporal lobe atrophy associated with neurodegenerative
disorders, in a background of moderate generalized parenchymal brain
volume loss. Findings progressed from 3672.
3. Severe chronic small vessel ischemic disease, old basal ganglia
lacunar infarcts, progressed from 3672.
